# Patient Record
Sex: Female | Born: 1985 | Race: Black or African American | Hispanic: No | Marital: Single | Smoking: Never smoker
Health system: Southern US, Community
[De-identification: ages and names within clinical notes are randomized; demographics above are authoritative.]

## PROBLEM LIST (undated history)

## (undated) DIAGNOSIS — M25475 Effusion, left foot: Secondary | ICD-10-CM

## (undated) DIAGNOSIS — E559 Vitamin D deficiency, unspecified: Secondary | ICD-10-CM

## (undated) DIAGNOSIS — E669 Obesity, unspecified: Secondary | ICD-10-CM

## (undated) DIAGNOSIS — M25471 Effusion, right ankle: Secondary | ICD-10-CM

## (undated) DIAGNOSIS — R7303 Prediabetes: Secondary | ICD-10-CM

## (undated) DIAGNOSIS — I10 Essential (primary) hypertension: Secondary | ICD-10-CM

## (undated) DIAGNOSIS — M2550 Pain in unspecified joint: Secondary | ICD-10-CM

## (undated) DIAGNOSIS — M5430 Sciatica, unspecified side: Secondary | ICD-10-CM

## (undated) DIAGNOSIS — R5383 Other fatigue: Secondary | ICD-10-CM

## (undated) DIAGNOSIS — E739 Lactose intolerance, unspecified: Secondary | ICD-10-CM

## (undated) DIAGNOSIS — R0602 Shortness of breath: Secondary | ICD-10-CM

## (undated) DIAGNOSIS — R1310 Dysphagia, unspecified: Secondary | ICD-10-CM

## (undated) DIAGNOSIS — M549 Dorsalgia, unspecified: Secondary | ICD-10-CM

## (undated) DIAGNOSIS — F419 Anxiety disorder, unspecified: Secondary | ICD-10-CM

## (undated) DIAGNOSIS — E785 Hyperlipidemia, unspecified: Secondary | ICD-10-CM

## (undated) DIAGNOSIS — K219 Gastro-esophageal reflux disease without esophagitis: Secondary | ICD-10-CM

## (undated) DIAGNOSIS — M25474 Effusion, right foot: Secondary | ICD-10-CM

## (undated) DIAGNOSIS — R609 Edema, unspecified: Secondary | ICD-10-CM

## (undated) DIAGNOSIS — A64 Unspecified sexually transmitted disease: Secondary | ICD-10-CM

## (undated) DIAGNOSIS — F32A Depression, unspecified: Secondary | ICD-10-CM

## (undated) DIAGNOSIS — K0889 Other specified disorders of teeth and supporting structures: Secondary | ICD-10-CM

## (undated) DIAGNOSIS — R002 Palpitations: Secondary | ICD-10-CM

## (undated) HISTORY — DX: Palpitations: R00.2

## (undated) HISTORY — DX: Effusion, left foot: M25.471

## (undated) HISTORY — DX: Other specified disorders of teeth and supporting structures: K08.89

## (undated) HISTORY — DX: Unspecified sexually transmitted disease: A64

## (undated) HISTORY — DX: Depression, unspecified: F32.A

## (undated) HISTORY — DX: Essential (primary) hypertension: I10

## (undated) HISTORY — DX: Lactose intolerance, unspecified: E73.9

## (undated) HISTORY — PX: WISDOM TOOTH EXTRACTION: SHX21

## (undated) HISTORY — DX: Prediabetes: R73.03

## (undated) HISTORY — DX: Shortness of breath: R06.02

## (undated) HISTORY — DX: Edema, unspecified: R60.9

## (undated) HISTORY — DX: Effusion, right foot: M25.474

## (undated) HISTORY — DX: Vitamin D deficiency, unspecified: E55.9

## (undated) HISTORY — DX: Hyperlipidemia, unspecified: E78.5

## (undated) HISTORY — DX: Obesity, unspecified: E66.9

## (undated) HISTORY — PX: TONSILLECTOMY AND ADENOIDECTOMY: SUR1326

## (undated) HISTORY — DX: Dorsalgia, unspecified: M54.9

## (undated) HISTORY — DX: Other fatigue: R53.83

## (undated) HISTORY — DX: Sciatica, unspecified side: M54.30

## (undated) HISTORY — DX: Anxiety disorder, unspecified: F41.9

## (undated) HISTORY — DX: Gastro-esophageal reflux disease without esophagitis: K21.9

## (undated) HISTORY — DX: Effusion, left foot: M25.475

## (undated) HISTORY — DX: Pain in unspecified joint: M25.50

## (undated) HISTORY — DX: Dysphagia, unspecified: R13.10

---
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
## 2006-04-04 ENCOUNTER — Emergency Department (HOSPITAL_COMMUNITY): Admission: EM | Admit: 2006-04-04 | Discharge: 2006-04-04 | Payer: Self-pay | Admitting: *Deleted

## 2007-08-23 ENCOUNTER — Emergency Department (HOSPITAL_COMMUNITY): Admission: EM | Admit: 2007-08-23 | Discharge: 2007-08-23 | Payer: Self-pay | Admitting: Emergency Medicine

## 2009-07-13 ENCOUNTER — Emergency Department (HOSPITAL_COMMUNITY): Admission: EM | Admit: 2009-07-13 | Discharge: 2009-07-13 | Payer: Self-pay | Admitting: Emergency Medicine

## 2010-05-05 ENCOUNTER — Encounter: Payer: Self-pay | Admitting: Cardiovascular Disease

## 2010-05-05 ENCOUNTER — Emergency Department (HOSPITAL_COMMUNITY): Admission: EM | Admit: 2010-05-05 | Discharge: 2010-05-05 | Payer: Self-pay | Admitting: Emergency Medicine

## 2010-05-20 ENCOUNTER — Ambulatory Visit: Payer: Self-pay | Admitting: Family Medicine

## 2010-05-20 ENCOUNTER — Encounter: Payer: Self-pay | Admitting: Family Medicine

## 2010-05-20 DIAGNOSIS — M25559 Pain in unspecified hip: Secondary | ICD-10-CM | POA: Insufficient documentation

## 2010-05-20 DIAGNOSIS — R002 Palpitations: Secondary | ICD-10-CM | POA: Insufficient documentation

## 2010-05-24 ENCOUNTER — Ambulatory Visit: Payer: Self-pay | Admitting: Family Medicine

## 2010-05-26 ENCOUNTER — Telehealth: Payer: Self-pay | Admitting: Family Medicine

## 2010-05-31 LAB — CONVERTED CEMR LAB
Basophils Absolute: 0.1 10*3/uL (ref 0.0–0.1)
Free T4: 0.92 ng/dL (ref 0.60–1.60)
HCT: 34.4 % — ABNORMAL LOW (ref 36.0–46.0)
Hemoglobin: 11.5 g/dL — ABNORMAL LOW (ref 12.0–15.0)
Lymphs Abs: 2.9 10*3/uL (ref 0.7–4.0)
Monocytes Relative: 6.4 % (ref 3.0–12.0)
Neutro Abs: 2.5 10*3/uL (ref 1.4–7.7)
RDW: 16 % — ABNORMAL HIGH (ref 11.5–14.6)

## 2010-06-03 ENCOUNTER — Encounter: Admission: RE | Admit: 2010-06-03 | Discharge: 2010-06-03 | Payer: Self-pay | Admitting: Family Medicine

## 2010-06-23 ENCOUNTER — Ambulatory Visit: Payer: Self-pay | Admitting: Family Medicine

## 2010-06-28 ENCOUNTER — Encounter (INDEPENDENT_AMBULATORY_CARE_PROVIDER_SITE_OTHER): Payer: Self-pay | Admitting: *Deleted

## 2010-08-18 NOTE — Assessment & Plan Note (Signed)
Summary: new to estab/cbs   Vital Signs:  Patient profile:   25 year old female Height:      67.25 inches Weight:      294.4 pounds BMI:     45.93 Pulse rate:   60 / minute Pulse rhythm:   regular BP sitting:   122 / 74  (left arm) Cuff size:   large  Vitals Entered By: Almeta Monas CMA Duncan Dull) (May 20, 2010 10:07 AM) CC: New Est Care--Wants to discuss Thyroid and Left hip pain issues   History of Present Illness: Pt is here to establish.  She c/o L hip pain.  She went to ER on 05/05/10 and was dx with bursitis and given prednisone but it caused chest pain after 3rd dose so the pt stopped it and it took a day for cp to resolve.   Pt was in [facility] and injured L leg but was told she just had a contusion to L ankle.    Pt is also concerned because she was told she had thyroid problems but no blood work or US done.  Pt does c/o palpatations daily-- they last a few minutes.   Pt denies stress but is anxious a lot.    Preventive Screening-Counseling & Management  Alcohol-Tobacco     Smoking Status: never  Caffeine-Diet-Exercise     Does Patient Exercise: no      Drug Use:  no.    Current Medications (verified): 1)  Mobic 15 Mg Tabs (Meloxicam) .Marland Kitchen.. 1 By Mouth Once Daily As Needed  Allergies (verified): No Known Drug Allergies  Past History:  Family History: Last updated: 05/20/2010 Family History Diabetes 1st degree relative---Mother Family History Hypertension--Maternal Grandmother Family History of Stroke F 1st degree relative <60-- Paternal Grandmother  Social History: Last updated: 05/20/2010 Single Never Smoked Alcohol use-yes-- Social Drug use-no Regular exercise-no  Risk Factors: Exercise: no (05/20/2010)  Risk Factors: Smoking Status: never (05/20/2010)  Past Medical History: Unremarkable  Past Surgical History: Tonsillectomy--1994  Family History: Reviewed history and no changes required. Family History Diabetes 1st degree  relative---Mother Family History Hypertension--Maternal Grandmother Family History of Stroke F 1st degree relative <60-- Paternal Grandmother  Social History: Reviewed history and no changes required. Single Never Smoked Alcohol use-yes-- Social Drug use-no Regular exercise-no Smoking Status:  never Drug Use:  no Does Patient Exercise:  no  Review of Systems      See HPI  Physical Exam  General:  Well-developed,well-nourished,in no acute distress; alert,appropriate and cooperative throughout examinationoverweight-appearing.   Mouth:  Oral mucosa and oropharynx without lesions or exudates.  Teeth in good repair. Neck:  supple and full ROM.   + goiter Lungs:  Normal respiratory effort, chest expands symmetrically. Lungs are clear to auscultation, no crackles or wheezes. Heart:  normal rate and no murmur.   Abdomen:  Bowel sounds positive,abdomen soft and non-tender without masses, organomegaly or hernias noted. Skin:  Intact without suspicious lesions or rashes Psych:  Oriented X3 and normally interactive.     Impression & Recommendations:  Problem # 1:  GOITER (ICD-240.9) check labs  Orders: Radiology Referral (Radiology)  Problem # 2:  PALPITATIONS, RECURRENT (ICD-785.1)  check event monitor and labs  Orders: Cardiology Referral (Cardiology) EKG w/ Interpretation (93000)  Avoid caffeine, chocolate, and stimulants. Stress reduction as well as medication options discussed.   Problem # 3:  HIP PAIN, LEFT (ICD-719.45) prob bursitis Her updated medication list for this problem includes:    Mobic 15 Mg Tabs (  Meloxicam) .Marland Kitchen... 1 by mouth once daily as needed  Discussed use of medications, application of heat or cold, and exercises. --to ortho if no relief  Complete Medication List: 1)  Mobic 15 Mg Tabs (Meloxicam) .Marland Kitchen.. 1 by mouth once daily as needed  Patient Instructions: 1)  fasting labs  785.1  240.5   [facility], free T3,  free T4, cbcd 2)  We will call  you when your results come in  Prescriptions: MOBIC 15 MG TABS (MELOXICAM) 1 by mouth once daily as needed  #30 x 1   Entered and Authorized by:   Loreen Freud DO   Signed by:   Loreen Freud DO on 05/20/2010   Method used:   Electronically to        [facility] [address] (retail)       [id number]       [place], [place]  [address]       Ph: [phone]       Fax: [phone]   RxID:   [id number]    Orders Added: 1)  Radiology Referral [Radiology] 2)  Cardiology Referral [Cardiology] 3)  New Patient Level III [[address]] 4)  EKG w/ Interpretation [93000]

## 2010-08-18 NOTE — Progress Notes (Signed)
Summary: CARDIO REFERRAL  Phone Note Other Incoming   Summary of Call: IN REFERENCE TO REFERRAL FOR EVENT MONITOR.......Marland KitchenAFTER FAXING ALL NOTES TO [facility] ON 05-20-2010, I JUST REC'D CALL FROM [facility], THIS EQUIPMENT/SERVICE IS NOT COVERED UNDER PATIENT'S PLAN, WAS ALSO REVIEWED BY MEDICAL DIRECTOR DR. THOMAS GAY WHO HAS DENIED.   Initial call taken by: Magdalen Spatz [facility]  May 26, 2010 4:52 PM  Follow-up for Phone Call        refer to cardio  Follow-up by: Loreen Freud DO,  May 26, 2010 5:00 PM  Additional Follow-up for Phone Call Additional follow up Details #1::        PATIENT APPT IS 06-01-2010 @ 8:15AM W/DR Eden Emms [facility], I WILL INFORM PT. Magdalen Spatz [facility]  May 27, 2010 4:31 PM

## 2010-08-18 NOTE — Letter (Signed)
Summary: Appointment - Missed  [facility], Main Office  [address]   [place], [place] [address]   Phone: [phone]  Fax: [phone]         June 28, 2010 MRN: [id number]      [facility] [address] [place], [place]  [address]    Dear Ms. Wion,  Our records indicate you missed your appointment on June 16, 2010 with Dr. Eden Emms.  It is very important that we reach you to reschedule this appointment. We look forward to participating in your health care needs. Please contact us at the number listed above at your earliest convenience to reschedule this appointment.     Sincerely,  Roe Coombs  [employer] Scheduling Team

## 2010-08-18 NOTE — Letter (Signed)
Summary: WL: Physician Documentation Sheet  WL: Physician Documentation Sheet   Imported By: Earl Many 05/31/2010 16:03:31  _____________________________________________________________________  External Attachment:    Type:   Image     Comment:   External Document

## 2010-08-18 NOTE — Assessment & Plan Note (Signed)
Summary: Review [facility]//KP   Vital Signs:  Patient profile:   25 year old female Weight:      294 pounds BMI:     45.87 Pulse rate:   60 / minute Pulse rhythm:   regular BP sitting:   116 / 72  (left arm) Cuff size:   regular  Vitals Entered By: Almeta Monas CMA Duncan Dull) (June 23, 2010 1:08 PM) CC: Review [facility]   History of Present Illness: Pt is here to review [facility].   No complaints.   Current Medications (verified): 1)  None  Allergies (verified): No Known Drug Allergies  Past History:  Past Medical History: Last updated: 05/31/2010 PALPITATIONS, RECURRENT  HIP PAIN, LEFT  GOITER FAMILY HISTORY DIABETES 1ST DEGREE RELATIVE  Past Surgical History: Last updated: 05/20/2010 Tonsillectomy--1994  Family History: Last updated: 05/20/2010 Family History Diabetes 1st degree relative---Mother Family History Hypertension--Maternal Grandmother Family History of Stroke F 1st degree relative <60-- Paternal Grandmother  Social History: Last updated: 05/20/2010 Single Never Smoked Alcohol use-yes-- Social Drug use-no Regular exercise-no  Risk Factors: Exercise: no (05/20/2010)  Risk Factors: Smoking Status: never (05/20/2010)  Family History: Reviewed history from 05/20/2010 and no changes required. Family History Diabetes 1st degree relative---Mother Family History Hypertension--Maternal Grandmother Family History of Stroke F 1st degree relative <60-- Paternal Grandmother  Social History: Reviewed history from 05/20/2010 and no changes required. Single Never Smoked Alcohol use-yes-- Social Drug use-no Regular exercise-no  Review of Systems      See HPI  Physical Exam  General:  Well-developed,well-nourished,in no acute distress; alert,appropriate and cooperative throughout examination Psych:  Cognition and judgment appear intact. Alert and cooperative with normal attention span and concentration. No apparent  delusions, illusions, hallucinations   Impression & Recommendations:  Problem # 1:  FAMILY HISTORY DIABETES 1ST DEGREE RELATIVE (ICD-V18.0)  Problem # 2:  MORBID OBESITY (ICD-278.01) diet and exercise d/w patient Ht: 67.25 (05/20/2010)   Wt: 294 (06/23/2010)   BMI: 45.87 (06/23/2010) we will recheck labs in 3-6 months  Patient Instructions: 1)  Please schedule a follow-up appointment in 3 months ---- [facility]  V18.0     Orders Added: 1)  Est. Patient Level III [[address]]

## 2010-09-16 ENCOUNTER — Other Ambulatory Visit: Payer: Self-pay

## 2010-10-17 LAB — POCT I-STAT, CHEM 8
HCT: 41 % (ref 36.0–46.0)
Hemoglobin: 13.9 g/dL (ref 12.0–15.0)
Potassium: 4.6 mEq/L (ref 3.5–5.1)
Sodium: 136 mEq/L (ref 135–145)
TCO2: 20 mmol/L (ref 0–100)

## 2010-10-17 LAB — WET PREP, GENITAL
Clue Cells Wet Prep HPF POC: NONE SEEN
Trich, Wet Prep: NONE SEEN
Yeast Wet Prep HPF POC: NONE SEEN

## 2010-10-17 LAB — DIFFERENTIAL
Basophils Absolute: 0 10*3/uL (ref 0.0–0.1)
Basophils Relative: 0 % (ref 0–1)
Eosinophils Absolute: 0 10*3/uL (ref 0.0–0.7)
Eosinophils Relative: 0 % (ref 0–5)
Lymphocytes Relative: 26 % (ref 12–46)

## 2010-10-17 LAB — URINALYSIS, ROUTINE W REFLEX MICROSCOPIC
Ketones, ur: 15 mg/dL — AB
Leukocytes, UA: NEGATIVE
Nitrite: NEGATIVE
Protein, ur: NEGATIVE mg/dL
Urobilinogen, UA: 0.2 mg/dL (ref 0.0–1.0)

## 2010-10-17 LAB — GLUCOSE, CAPILLARY

## 2010-10-17 LAB — CBC
HCT: 38.7 % (ref 36.0–46.0)
MCHC: 32.8 g/dL (ref 30.0–36.0)
MCV: 75.1 fL — ABNORMAL LOW (ref 78.0–100.0)
Platelets: 206 10*3/uL (ref 150–400)
RDW: 16 % — ABNORMAL HIGH (ref 11.5–15.5)

## 2011-04-04 ENCOUNTER — Emergency Department (HOSPITAL_COMMUNITY)
Admission: EM | Admit: 2011-04-04 | Discharge: 2011-04-05 | Disposition: A | Discharge status: Home / Self Care | Payer: Worker's Compensation | Attending: Emergency Medicine | Admitting: Emergency Medicine

## 2011-04-04 DIAGNOSIS — M25569 Pain in unspecified knee: Secondary | ICD-10-CM | POA: Insufficient documentation

## 2011-04-04 DIAGNOSIS — Y92009 Unspecified place in unspecified non-institutional (private) residence as the place of occurrence of the external cause: Secondary | ICD-10-CM | POA: Insufficient documentation

## 2011-04-04 DIAGNOSIS — W11XXXA Fall on and from ladder, initial encounter: Secondary | ICD-10-CM | POA: Insufficient documentation

## 2011-04-05 ENCOUNTER — Other Ambulatory Visit: Payer: Self-pay | Admitting: Occupational Medicine

## 2011-04-05 ENCOUNTER — Ambulatory Visit: Payer: Worker's Compensation

## 2011-04-05 DIAGNOSIS — R52 Pain, unspecified: Secondary | ICD-10-CM

## 2011-04-07 LAB — INFLUENZA A AND B ANTIGEN (CONVERTED LAB): Influenza B Ag: NEGATIVE

## 2011-07-24 ENCOUNTER — Encounter: Payer: Self-pay | Admitting: Family Medicine

## 2011-07-24 ENCOUNTER — Ambulatory Visit (INDEPENDENT_AMBULATORY_CARE_PROVIDER_SITE_OTHER): Payer: 59 | Admitting: Family Medicine

## 2011-07-24 DIAGNOSIS — R635 Abnormal weight gain: Secondary | ICD-10-CM

## 2011-07-24 DIAGNOSIS — R739 Hyperglycemia, unspecified: Secondary | ICD-10-CM

## 2011-07-24 DIAGNOSIS — R7309 Other abnormal glucose: Secondary | ICD-10-CM

## 2011-07-24 DIAGNOSIS — K5900 Constipation, unspecified: Secondary | ICD-10-CM

## 2011-07-24 DIAGNOSIS — R21 Rash and other nonspecific skin eruption: Secondary | ICD-10-CM

## 2011-07-24 MED ORDER — MOMETASONE FUROATE 0.1 % EX CREA: TOPICAL_CREAM | Freq: Every day | CUTANEOUS | Status: DC

## 2011-07-24 NOTE — Patient Instructions (Signed)

## 2011-07-24 NOTE — Progress Notes (Signed)
  Subjective:     Lindsey Figueroa is a 26 y.o. female who presents for evaluation of constipation. Onset was several weeks ago. Patient has been having 2 stools a day but states they are hard.  .. Co-Morbid conditions:none. Symptoms have been occurring for several weeks.  . Current Health Habits: Eating fiber? no, Exercise? no, Adequate hydration? no. Current over the counter/prescription laxative: mag citrate which has been not very effective.   Review of Systems Review of Systems  Constitutional: Negative for activity change, appetite change and fatigue.  HENT: Negative for hearing loss, congestion, tinnitus and ear discharge.  dentist q37m Eyes: Negative for visual disturbance (see optho q1y -- vision corrected to 20/20 with glasses).  Respiratory: Negative for cough, chest tightness and shortness of breath.   Cardiovascular: Negative for chest pain, palpitations and leg swelling.  Gastrointestinal: Negative for abdominal pain, diarrhea, constipation and abdominal distention.  Genitourinary: Negative for urgency, frequency, decreased urine volume and difficulty urinating.  Musculoskeletal: Negative for back pain, arthralgias and gait problem.  Skin: Negative for color change, pallor    __+++  rash Neurological: Negative for dizziness, light-headedness, numbness and headaches.  Hematological: Negative for adenopathy. Does not bruise/bleed easily.  Psychiatric/Behavioral: Negative for suicidal ideas, confusion, sleep disturbance, self-injury, dysphoric mood, decreased concentration and agitation.      Objective:    Gen---AAOx3  NAD Cor--  S1S2 Lungs-- CTAB/L abd --soft NT Skin-- + linear rash  L side chest,  Papular--- not vesicular        A/P---  1.  Constipation---  Increase fiber,  Increase water intake and exercise--- explained to pt that she really does not have constipation               2.   Rash--- ? Etiology-- elocon             3 obesity--- rec  Weight watchers and  exercise                                             Assessment:      Plan:

## 2011-07-25 LAB — BASIC METABOLIC PANEL
GFR: 128.34 mL/min (ref >60.00)
Glucose, Bld: 97 mg/dL (ref 70–99)
Potassium: 4.9 mEq/L (ref 3.5–5.1)
Sodium: 137 mEq/L (ref 135–145)

## 2011-07-25 LAB — TSH: TSH: 1.69 u[IU]/mL (ref 0.35–5.50)

## 2011-07-26 ENCOUNTER — Other Ambulatory Visit: Payer: Self-pay

## 2011-07-26 DIAGNOSIS — R21 Rash and other nonspecific skin eruption: Secondary | ICD-10-CM

## 2011-07-26 NOTE — Telephone Encounter (Signed)
msg left to call the office     KP 

## 2011-07-26 NOTE — Telephone Encounter (Signed)
Message copied by Arnette Norris on Wed Jul 26, 2011  5:14 PM ------      Message from: Lelon Perla      Created: Mon Jul 24, 2011  9:39 PM       I forgot to ask for pharmacy before they left .  I wanted to send elocon cream to pharmacy for her===  sorry

## 2011-07-28 MED ORDER — MOMETASONE FUROATE 0.1 % EX CREA: TOPICAL_CREAM | Freq: Every day | CUTANEOUS | Status: AC

## 2011-07-28 NOTE — Telephone Encounter (Signed)
[facility] per patient---- Rx faxed    KP

## 2011-07-28 NOTE — Telephone Encounter (Signed)
Patient returned phone call. Best# [id number]

## 2011-08-01 ENCOUNTER — Other Ambulatory Visit (HOSPITAL_COMMUNITY)
Admission: RE | Admit: 2011-08-01 | Discharge: 2011-08-01 | Disposition: A | Discharge status: Home / Self Care | Payer: 59 | Source: Ambulatory Visit | Attending: Obstetrics and Gynecology | Admitting: Obstetrics and Gynecology

## 2011-08-01 ENCOUNTER — Other Ambulatory Visit: Payer: Self-pay | Admitting: Obstetrics and Gynecology

## 2011-08-01 DIAGNOSIS — Z113 Encounter for screening for infections with a predominantly sexual mode of transmission: Secondary | ICD-10-CM | POA: Insufficient documentation

## 2011-08-01 DIAGNOSIS — Z124 Encounter for screening for malignant neoplasm of cervix: Secondary | ICD-10-CM | POA: Insufficient documentation

## 2012-04-03 ENCOUNTER — Ambulatory Visit (INDEPENDENT_AMBULATORY_CARE_PROVIDER_SITE_OTHER): Payer: BC Managed Care – PPO | Admitting: Family Medicine

## 2012-04-03 ENCOUNTER — Encounter: Payer: Self-pay | Admitting: Family Medicine

## 2012-04-03 VITALS — BP 118/70 | HR 79 | Temp 98.3°F | Wt 319.0 lb

## 2012-04-03 DIAGNOSIS — Z309 Encounter for contraceptive management, unspecified: Secondary | ICD-10-CM | POA: Insufficient documentation

## 2012-04-03 DIAGNOSIS — Z7251 High risk heterosexual behavior: Secondary | ICD-10-CM

## 2012-04-03 LAB — HCG, QUANTITATIVE, PREGNANCY: hCG, Beta Chain, Quant, S: 0.18 m[IU]/mL

## 2012-04-03 MED ORDER — DESOGESTREL-ETHINYL ESTRADIOL 0.15-0.02/0.01 MG (21/5) PO TABS: 1.0000 | ORAL_TABLET | Freq: Every day | ORAL | Status: DC

## 2012-04-03 NOTE — Assessment & Plan Note (Signed)
mirette---d/w pt how to take Check preg test

## 2012-04-03 NOTE — Patient Instructions (Signed)
Contraception Choices Contraception (birth control) is the use of any methods or devices to prevent pregnancy. Below are some methods to help avoid pregnancy. HORMONAL METHODS   Contraceptive implant. This is a thin, plastic tube containing progesterone hormone. It does not contain estrogen hormone. Your caregiver inserts the tube in the inner part of the upper arm. The tube can remain in place for up to 3 years. After 3 years, the implant must be removed. The implant prevents the ovaries from releasing an egg (ovulation), thickens the cervical mucus which prevents sperm from entering the uterus, and thins the lining of the inside of the uterus.   Progesterone-only injections. These injections are given every 3 months by your caregiver to prevent pregnancy. This synthetic progesterone hormone stops the ovaries from releasing eggs. It also thickens cervical mucus and changes the uterine lining. This makes it harder for sperm to survive in the uterus.   Birth control pills. These pills contain estrogen and progesterone hormone. They work by stopping the egg from forming in the ovary (ovulation). Birth control pills are prescribed by a caregiver.Birth control pills can also be used to treat heavy periods.   Minipill. This type of birth control pill contains only the progesterone hormone. They are taken every day of each month and must be prescribed by your caregiver.   Birth control patch. The patch contains hormones similar to those in birth control pills. It must be changed once a week and is prescribed by a caregiver.   Vaginal ring. The ring contains hormones similar to those in birth control pills. It is left in the vagina for 3 weeks, removed for 1 week, and then a new one is put back in place. The patient must be comfortable inserting and removing the ring from the vagina.A caregiver's prescription is necessary.   Emergency contraception. Emergency contraceptives prevent pregnancy after  unprotected sexual intercourse. This pill can be taken right after sex or up to 5 days after unprotected sex. It is most effective the sooner you take the pills after having sexual intercourse. Emergency contraceptive pills are available without a prescription. Check with your pharmacist. Do not use emergency contraception as your only form of birth control.  BARRIER METHODS   Female condom. This is a thin sheath (latex or rubber) that is worn over the penis during sexual intercourse. It can be used with spermicide to increase effectiveness.   Female condom. This is a soft, loose-fitting sheath that is put into the vagina before sexual intercourse.   Diaphragm. This is a soft, latex, dome-shaped barrier that must be fitted by a caregiver. It is inserted into the vagina, along with a spermicidal jelly. It is inserted before intercourse. The diaphragm should be left in the vagina for 6 to 8 hours after intercourse.   Cervical cap. This is a round, soft, latex or plastic cup that fits over the cervix and must be fitted by a caregiver. The cap can be left in place for up to 48 hours after intercourse.   Sponge. This is a soft, circular piece of polyurethane foam. The sponge has spermicide in it. It is inserted into the vagina after wetting it and before sexual intercourse.   Spermicides. These are chemicals that kill or block sperm from entering the cervix and uterus. They come in the form of creams, jellies, suppositories, foam, or tablets. They do not require a prescription. They are inserted into the vagina with an applicator before having sexual intercourse. The process must be   repeated every time you have sexual intercourse.  INTRAUTERINE CONTRACEPTION  Intrauterine device (IUD). This is a T-shaped device that is put in a woman's uterus during a menstrual period to prevent pregnancy. There are 2 types:   Copper IUD. This type of IUD is wrapped in copper wire and is placed inside the uterus. Copper  makes the uterus and fallopian tubes produce a fluid that kills sperm. It can stay in place for 10 years.   Hormone IUD. This type of IUD contains the hormone progestin (synthetic progesterone). The hormone thickens the cervical mucus and prevents sperm from entering the uterus, and it also thins the uterine lining to prevent implantation of a fertilized egg. The hormone can weaken or kill the sperm that get into the uterus. It can stay in place for 5 years.  PERMANENT METHODS OF CONTRACEPTION  Female tubal ligation. This is when the woman's fallopian tubes are surgically sealed, tied, or blocked to prevent the egg from traveling to the uterus.   Female sterilization. This is when the female has the tubes that carry sperm tied off (vasectomy).This blocks sperm from entering the vagina during sexual intercourse. After the procedure, the man can still ejaculate fluid (semen).  NATURAL PLANNING METHODS  Natural family planning. This is not having sexual intercourse or using a barrier method (condom, diaphragm, cervical cap) on days the woman could become pregnant.   Calendar method. This is keeping track of the length of each menstrual cycle and identifying when you are fertile.   Ovulation method. This is avoiding sexual intercourse during ovulation.   Symptothermal method. This is avoiding sexual intercourse during ovulation, using a thermometer and ovulation symptoms.   Post-ovulation method. This is timing sexual intercourse after you have ovulated.  Regardless of which type or method of contraception you choose, it is important that you use condoms to protect against the transmission of sexually transmitted diseases (STDs). Talk with your caregiver about which form of contraception is most appropriate for you. Document Released: 07/03/2005 Document Revised: 06/22/2011 Document Reviewed: 11/09/2010 Exit[employer] Patient Information 2012 [employer]. 

## 2012-04-03 NOTE — Progress Notes (Signed)
  Subjective:    Patient ID: Lindsey Figueroa, female    DOB: 1985-08-01, 26 y.o.   MRN: [id number]  HPI Pt here to discuss bcp.  She is interested in bcp.  She had unprotected sex last week and wants to be checked for pregnancy even though she has her period right now..     Review of Systems    as above Objective:   Physical Exam  Constitutional: She is oriented to person, place, and time. She appears well-developed and well-nourished.  Neurological: She is alert and oriented to person, place, and time.  Psychiatric: She has a normal mood and affect. Her behavior is normal. Judgment and thought content normal.          Assessment & Plan:

## 2012-04-16 ENCOUNTER — Telehealth: Payer: Self-pay | Admitting: Family Medicine

## 2012-04-16 MED ORDER — FLUCONAZOLE 150 MG PO TABS: 150.0000 mg | ORAL_TABLET | Freq: Once | ORAL | Status: DC

## 2012-04-16 NOTE — Telephone Encounter (Signed)
Please advise      KP 

## 2012-04-16 NOTE — Telephone Encounter (Signed)
Caller: Linlee/Patient; Patient Name: Lindsey Figueroa; PCP: Lelon Perla.; [facility] Callback Phone Number: [phone]; Reason for call: Believes she has a yeast infection.  LMP 04/09/12; Patient is having severe vaginal itching to the point of making her bleed. Triaged using Vaginal Discharge or Irritation with a disposition to see provider within 24 hours due to first time occurrence of foul- smelling or unusual vaginal discharge.   OFFICE:  PLEASE FOLLOW UP WITH PATIENT.  DR. Laury Axon AND DR. PAZ ARE FULL. TRIAGED SEE IN 24 BUT IS VERY UNCOMFORTABLE DUE TO THE ITCHING.  THANKS.

## 2012-04-16 NOTE — Telephone Encounter (Signed)
Rx sent--- msg left to cal the office     KP

## 2012-04-16 NOTE — Telephone Encounter (Signed)
Diflucan 150 mg  #1  Po qd x1------ ov for tomorrow or Thursday ---she can cancel if it resolves

## 2012-04-17 NOTE — Telephone Encounter (Signed)
[facility] Fax: [phone] From: [facility] Date/ Time: 04/16/2012 5:12 PM Taken By: Jethro BolusVioleta Figueroa Facility: home Patient: Lindsey Figueroa, Lindsey Figueroa DOB: September 25, 1985 Phone: [phone] Reason for Call: Patient returning call to [facility] regarding scheduling an appointment. Please call at [phone] Regarding Appointment: Appt Date: Appt Time: Unknown Provider: Reason: Details: Outcome:

## 2012-04-17 NOTE — Telephone Encounter (Signed)
Detailed msg left advising the patient that the RX was filled and follow up as needed.      KP

## 2012-05-20 ENCOUNTER — Encounter: Payer: BC Managed Care – PPO | Admitting: Family Medicine

## 2012-05-20 ENCOUNTER — Ambulatory Visit (INDEPENDENT_AMBULATORY_CARE_PROVIDER_SITE_OTHER): Payer: BC Managed Care – PPO | Admitting: Family Medicine

## 2012-05-20 ENCOUNTER — Encounter: Payer: Self-pay | Admitting: Family Medicine

## 2012-05-20 ENCOUNTER — Telehealth: Payer: Self-pay | Admitting: Family Medicine

## 2012-05-20 VITALS — BP 112/80 | HR 76 | Temp 98.1°F | Resp 16 | Wt 317.5 lb

## 2012-05-20 DIAGNOSIS — N898 Other specified noninflammatory disorders of vagina: Secondary | ICD-10-CM

## 2012-05-20 DIAGNOSIS — L293 Anogenital pruritus, unspecified: Secondary | ICD-10-CM

## 2012-05-20 DIAGNOSIS — R3989 Other symptoms and signs involving the genitourinary system: Secondary | ICD-10-CM

## 2012-05-20 DIAGNOSIS — R399 Unspecified symptoms and signs involving the genitourinary system: Secondary | ICD-10-CM | POA: Insufficient documentation

## 2012-05-20 LAB — POCT URINALYSIS DIPSTICK
Glucose, UA: NEGATIVE
Ketones, UA: NEGATIVE
Nitrite, UA: NEGATIVE
Nitrite, UA: NEGATIVE
Spec Grav, UA: 1.02
Urobilinogen, UA: 0.2
Urobilinogen, UA: 0.2
pH, UA: 6
pH, UA: 6

## 2012-05-20 MED ORDER — CEPHALEXIN 500 MG PO CAPS: 500.0000 mg | ORAL_CAPSULE | Freq: Two times a day (BID) | ORAL | Status: AC

## 2012-05-20 NOTE — Progress Notes (Signed)
  Subjective:    Patient ID: Lindsey Figueroa, female    DOB: Jan 29, 1986, 26 y.o.   MRN: [id number]  HPI Vaginal itching- sxs started over 1 month ago.  Was given Diflucan on 10/1.  sxs initially improved but returned.  Now burning, itching, 'very comfortable'.  + vaginal discharge.  + burning w/ urination.  Increased frequency.  Denies incomplete emptying or hesitancy.  No blood in urine.  No fevers, no back pain.   Review of Systems For ROS see HPI     Objective:   Physical Exam  Vitals reviewed. Constitutional: She appears well-developed and well-nourished. No distress.       obese  Abdominal: Soft. She exhibits no distension. There is no tenderness (no suprapubic or CVA tenderness).  Genitourinary: No vaginal discharge (scant foul smelling d/c) found.          Assessment & Plan:

## 2012-05-20 NOTE — Telephone Encounter (Signed)
Patient calling, she took the Diflucan 150mg  po several weeks ago and the sx improved slightly but are still present.  No fever.  No additional vaginal discharge. Has burning on urination.  Triaged per Vaginal Disharge, needs to be seen in 24 hours.  Scheduled for a 30 minute visit this afternoon with Dr. Beverely Low at 1345.

## 2012-05-20 NOTE — Patient Instructions (Addendum)
Start the Keflex for a UTI- twice daily w/ food Drink plenty of fluids We'll determine the cause of your vaginal discharge and call in the appropriate medicine Call with any questions or concerns Hang in there!!

## 2012-05-20 NOTE — Assessment & Plan Note (Signed)
New to provider.  No improvement w/ diflucan.  Pt w/ odor on exam, suspect BV.  Check wet prep- start meds prn.  Reviewed supportive care and red flags that should prompt return.  Pt expressed understanding and is in agreement w/ plan.

## 2012-05-20 NOTE — Assessment & Plan Note (Signed)
New.  Pt's UA consistent w/ infxn.  Start abx.  Await urine cx.  Reviewed supportive care and red flags that should prompt return.  Pt expressed understanding and is in agreement w/ plan.  

## 2012-05-20 NOTE — Progress Notes (Signed)
This encounter was created in error - please disregard.

## 2012-05-20 NOTE — Telephone Encounter (Signed)
Patient has a pending apt today-- No further action needed will forward to MD as an FYI.     KP

## 2012-05-21 MED ORDER — METRONIDAZOLE 500 MG PO TABS: 500.0000 mg | ORAL_TABLET | Freq: Two times a day (BID) | ORAL | Status: DC

## 2012-05-21 NOTE — Addendum Note (Signed)
Addended by: Jackson Latino on: 05/21/2012 09:52 AM   Modules accepted: Orders

## 2012-06-14 ENCOUNTER — Other Ambulatory Visit: Payer: Self-pay | Admitting: Family Medicine

## 2012-06-14 MED ORDER — METRONIDAZOLE 500 MG PO TABS: 500.0000 mg | ORAL_TABLET | Freq: Two times a day (BID) | ORAL | Status: AC

## 2012-06-14 NOTE — Telephone Encounter (Signed)
refill Metronidazol 500mg  tab acta #14 last fill 11.5.13, Take one tablet by mouth twice daily last ov 11.4.13 acute

## 2012-06-14 NOTE — Telephone Encounter (Signed)
Please advise      KP 

## 2012-06-14 NOTE — Telephone Encounter (Signed)
Ok to refill x1  ---will need ov if it does not resolve

## 2012-06-14 NOTE — Telephone Encounter (Signed)
Patient aware and voiced understanding. Rx sent      KP 

## 2013-02-02 ENCOUNTER — Encounter (HOSPITAL_COMMUNITY): Payer: Self-pay | Admitting: Nurse Practitioner

## 2013-02-02 ENCOUNTER — Emergency Department (HOSPITAL_COMMUNITY)
Admission: EM | Admit: 2013-02-02 | Discharge: 2013-02-02 | Disposition: A | Discharge status: Home / Self Care | Payer: Self-pay | Attending: Emergency Medicine | Admitting: Emergency Medicine

## 2013-02-02 DIAGNOSIS — Z3202 Encounter for pregnancy test, result negative: Secondary | ICD-10-CM | POA: Insufficient documentation

## 2013-02-02 DIAGNOSIS — M549 Dorsalgia, unspecified: Secondary | ICD-10-CM

## 2013-02-02 DIAGNOSIS — M545 Low back pain, unspecified: Secondary | ICD-10-CM | POA: Insufficient documentation

## 2013-02-02 DIAGNOSIS — R52 Pain, unspecified: Secondary | ICD-10-CM | POA: Insufficient documentation

## 2013-02-02 LAB — URINALYSIS, ROUTINE W REFLEX MICROSCOPIC
Glucose, UA: NEGATIVE mg/dL
Leukocytes, UA: NEGATIVE
Protein, ur: NEGATIVE mg/dL
Specific Gravity, Urine: 1.016 (ref 1.005–1.030)
pH: 6 (ref 5.0–8.0)

## 2013-02-02 MED ORDER — HYDROCODONE-ACETAMINOPHEN 5-325 MG PO TABS: 1.0000 | ORAL_TABLET | Freq: Four times a day (QID) | ORAL | Status: DC | PRN

## 2013-02-02 MED ORDER — OXYCODONE-ACETAMINOPHEN 5-325 MG PO TABS
1.0000 | ORAL_TABLET | Freq: Once | ORAL | Status: AC
  Administered 2013-02-02: 1 via ORAL
  Filled 2013-02-02: qty 1

## 2013-02-02 MED ORDER — MELOXICAM 15 MG PO TABS: 15.0000 mg | ORAL_TABLET | Freq: Every day | ORAL | Status: DC

## 2013-02-02 MED ORDER — ONDANSETRON 4 MG PO TBDP
4.0000 mg | ORAL_TABLET | Freq: Once | ORAL | Status: AC
  Administered 2013-02-02: 4 mg via ORAL
  Filled 2013-02-02: qty 1

## 2013-02-02 NOTE — ED Notes (Signed)
Pt with lower back pain, "feels like a muscle spasm" since yesterday am. Pain relieved by walking. Pt reports she was bending and lifting objects before the pain started. Took a muscle relaxer with no relief

## 2013-02-02 NOTE — ED Notes (Signed)
Signature pad not working. 

## 2013-02-02 NOTE — ED Provider Notes (Signed)
History    CSN: [id number] Arrival date & time 02/02/13  1216  First MD Initiated Contact with Patient 02/02/13 1224     Chief Complaint  Patient presents with  . Back Pain   (Consider location/radiation/quality/duration/timing/severity/associated sxs/prior Treatment) HPI Comments:  Lindsey Figueroa is a 27 y.o. female who complains of low back pain for 2 day(s), positional with bending or lifting, without radiation down the legs. Precipitating factors: none recalled by the patient. Prior history of back problems: no prior back problems. There is no numbness in the legs. She c/o L lumbar pain. Worse with twisting and bending.  No radiation. No urinary sxs or vaginal sxs.      Patient is a 27 y.o. female presenting with back pain. The history is provided by the patient. No language interpreter was used.  Back Pain Location:  Lumbar spine Quality:  Aching Radiates to:  Does not radiate Pain severity:  Moderate Pain is:  Same all the time Onset quality:  Gradual Duration:  2 days Timing:  Constant Chronicity:  New Context: not emotional stress, not falling, not lifting heavy objects, not MCA, not MVA, not pedestrian accident, not physical stress, not recent illness and not twisting   Relieved by:  Nothing Worsened by:  Ambulation, bending and coughing Associated symptoms: no abdominal pain, no abdominal swelling, no bladder incontinence, no bowel incontinence, no chest pain, no dysuria, no fever, no headaches, no leg pain, no numbness, no paresthesias, no pelvic pain, no perianal numbness, no tingling, no weakness and no weight loss    History reviewed. No pertinent past medical history. History reviewed. No pertinent past surgical history. History reviewed. No pertinent family history. History  Substance Use Topics  . Smoking status: Never Smoker   . Smokeless tobacco: Never Used  . Alcohol Use: No   OB History   Grav Para Term Preterm Abortions TAB SAB Ect Mult Living               Review of Systems  Constitutional: Negative for fever and weight loss.  Cardiovascular: Negative for chest pain.  Gastrointestinal: Negative for abdominal pain and bowel incontinence.  Genitourinary: Negative for bladder incontinence, dysuria and pelvic pain.  Musculoskeletal: Positive for back pain.  Neurological: Negative for tingling, weakness, numbness, headaches and paresthesias.    Allergies  Review of patient's allergies indicates no known allergies.  Home Medications   Current Outpatient Rx  Name  Route  Sig  Dispense  Refill  . desogestrel-ethinyl estradiol (KARIVA,AZURETTE,MIRCETTE) 0.15-0.02/0.01 MG (21/5) tablet   Oral   Take 1 tablet by mouth daily.   1 Package   11   . fluconazole (DIFLUCAN) 150 MG tablet   Oral   Take 1 tablet (150 mg total) by mouth once.   1 tablet   0    BP 95/76  Pulse 69  Temp(Src) 98.1 F (36.7 C) (Oral)  Resp 20  SpO2 99% Physical Exam  Constitutional: She is oriented to person, place, and time. She appears well-developed and well-nourished. No distress.  HENT:  Head: Normocephalic and atraumatic.  Eyes: Conjunctivae are normal. No scleral icterus.  Neck: Normal range of motion.  Cardiovascular: Normal rate, regular rhythm and normal heart sounds.  Exam reveals no gallop and no friction rub.   No murmur heard. Pulmonary/Chest: Effort normal and breath sounds normal. No respiratory distress.  Abdominal: Soft. Bowel sounds are normal. She exhibits no distension and no mass. There is no tenderness. There is no guarding.  Musculoskeletal:  Back, ROM limited due to pain. TTP L lumbar paraspinals. No cva tenderness  Neurological: She is alert and oriented to person, place, and time. She has normal reflexes. She displays normal reflexes.  Strength 5/5  Skin: Skin is warm and dry. She is not diaphoretic.    ED Course  Procedures (including critical care time) Labs Reviewed  URINALYSIS, ROUTINE W REFLEX MICROSCOPIC -  Abnormal; Notable for the following:    APPearance CLOUDY (*)    Hgb urine dipstick SMALL (*)    All other components within normal limits  URINE MICROSCOPIC-ADD ON - Abnormal; Notable for the following:    Squamous Epithelial / LPF FEW (*)    All other components within normal limits  POCT PREGNANCY, URINE   No results found. 1. Back pain, acute     MDM  3:18 PM Filed Vitals:   02/02/13 1226 02/02/13 1237  BP: 133/70 95/76  Pulse: 70 69  Temp: 98.1 F (36.7 C)   TempSrc: Oral Oral  Resp: 20   SpO2: 100% 99%    Patient with negative urine. Patient states that her down after admin of pecocet. Patient with back pain.  No neurological deficits and normal neuro exam.  Patient can walk but states is painful.  No loss of bowel or bladder control.  No concern for cauda equina.  No fever, night sweats, weight loss, h/o cancer, IVDU.  RICE protocol and pain medicine indicated and discussed with patient.     Arthor Captain, PA-C 02/02/13 1519

## 2013-02-03 NOTE — ED Provider Notes (Signed)
Medical screening examination/treatment/procedure(s) were performed by non-physician practitioner and as supervising physician I was immediately available for consultation/collaboration.    Sheridan Hew D Enis Riecke, MD 02/03/13 0947 

## 2013-02-25 ENCOUNTER — Encounter: Payer: BC Managed Care – PPO | Admitting: Family Medicine

## 2013-03-10 ENCOUNTER — Encounter: Payer: Self-pay | Admitting: Lab

## 2013-03-11 ENCOUNTER — Encounter: Payer: BC Managed Care – PPO | Admitting: Family Medicine

## 2013-05-22 ENCOUNTER — Other Ambulatory Visit: Payer: Self-pay

## 2013-09-18 ENCOUNTER — Telehealth: Payer: Self-pay

## 2013-09-18 NOTE — Telephone Encounter (Signed)
Left message for call back Non-identifiable   Pap- 08/04/11-negative; scheduled to receive Pap with upcoming appt on 3/9 Flu- Td-

## 2013-09-22 ENCOUNTER — Other Ambulatory Visit (HOSPITAL_COMMUNITY)
Admission: RE | Admit: 2013-09-22 | Discharge: 2013-09-22 | Disposition: A | Discharge status: Home / Self Care | Payer: BC Managed Care – PPO | Source: Ambulatory Visit | Attending: Family Medicine | Admitting: Family Medicine

## 2013-09-22 ENCOUNTER — Encounter: Payer: Self-pay | Admitting: Family Medicine

## 2013-09-22 ENCOUNTER — Ambulatory Visit (INDEPENDENT_AMBULATORY_CARE_PROVIDER_SITE_OTHER): Payer: BC Managed Care – PPO | Admitting: Family Medicine

## 2013-09-22 VITALS — BP 118/72 | HR 85 | Temp 98.3°F | Ht 66.5 in | Wt 329.0 lb

## 2013-09-22 DIAGNOSIS — Z1151 Encounter for screening for human papillomavirus (HPV): Secondary | ICD-10-CM | POA: Insufficient documentation

## 2013-09-22 DIAGNOSIS — Z23 Encounter for immunization: Secondary | ICD-10-CM

## 2013-09-22 DIAGNOSIS — Z01419 Encounter for gynecological examination (general) (routine) without abnormal findings: Secondary | ICD-10-CM | POA: Insufficient documentation

## 2013-09-22 DIAGNOSIS — Z124 Encounter for screening for malignant neoplasm of cervix: Secondary | ICD-10-CM

## 2013-09-22 DIAGNOSIS — Z6841 Body Mass Index (BMI) 40.0 and over, adult: Secondary | ICD-10-CM

## 2013-09-22 DIAGNOSIS — Z0000 Encounter for general adult medical examination without abnormal findings: Secondary | ICD-10-CM

## 2013-09-22 DIAGNOSIS — Z113 Encounter for screening for infections with a predominantly sexual mode of transmission: Secondary | ICD-10-CM | POA: Insufficient documentation

## 2013-09-22 MED ORDER — LORCASERIN HCL 10 MG PO TABS: ORAL_TABLET | ORAL | Status: DC

## 2013-09-22 NOTE — Assessment & Plan Note (Signed)
belviq rx rto 1 month

## 2013-09-22 NOTE — Addendum Note (Signed)
Addended by: Arnette NorrisPAYNE, Emrey Thornley P on: 09/22/2013 01:20 PM   Modules accepted: Orders

## 2013-09-22 NOTE — Progress Notes (Signed)
Pre visit review using our clinic review tool, if applicable. No additional management support is needed unless otherwise documented below in the visit note. 

## 2013-09-22 NOTE — Patient Instructions (Signed)

## 2013-09-22 NOTE — Progress Notes (Signed)
Subjective:     Lindsey Figueroa is a 28 y.o. female and is here for a comprehensive physical exam. The patient reports problems - obesity and weight loss.  History   Social History  . Marital Status: Single    Spouse Name: N/A    Number of Children: N/A  . Years of Education: N/A   Occupational History  . Not on file.   Social History Main Topics  . Smoking status: Never Smoker   . Smokeless tobacco: Never Used  . Alcohol Use: No  . Drug Use: No  . Sexual Activity: Not on file   Other Topics Concern  . Not on file   Social History Narrative   No exercise   Health Maintenance  Topic Date Due  . Tetanus/tdap  10/11/2002  . Influenza Vaccine  02/14/2013  . Pap Smear  08/03/2014    The following portions of the patient's history were reviewed and updated as appropriate:  She  has no past medical history on file. She  does not have any pertinent problems on file. She  has past surgical history that includes Wisdom tooth extraction and Tonsillectomy and adenoidectomy. Her family history includes Cancer - Lung in her maternal grandfather; Diabetes in her mother; Hypertension in her maternal grandmother. She  reports that she has never smoked. She has never used smokeless tobacco. She reports that she does not drink alcohol or use illicit drugs. She currently has no medications in their medication list. No current outpatient prescriptions on file prior to visit.   No current facility-administered medications on file prior to visit.   She has No Known Allergies..  Review of Systems Review of Systems  Constitutional: Negative for activity change, appetite change and fatigue.  HENT: Negative for hearing loss, congestion, tinnitus and ear discharge.  dentist q32m Eyes: Negative for visual disturbance (see optho-- due) Respiratory: Negative for cough, chest tightness and shortness of breath.   Cardiovascular: Negative for chest pain, palpitations and leg swelling.   Gastrointestinal: Negative for abdominal pain, diarrhea, constipation and abdominal distention.  Genitourinary: Negative for urgency, frequency, decreased urine volume and difficulty urinating.  Musculoskeletal: Negative for back pain, arthralgias and gait problem.  Skin: Negative for color change, pallor and rash.  Neurological: Negative for dizziness, light-headedness, numbness and headaches.  Hematological: Negative for adenopathy. Does not bruise/bleed easily.  Psychiatric/Behavioral: Negative for suicidal ideas, confusion, sleep disturbance, self-injury, dysphoric mood, decreased concentration and agitation.       Objective:    BP 118/72  Pulse 85  Temp(Src) 98.3 F (36.8 C) (Oral)  Ht 5' 6.5" (1.689 m)  Wt 329 lb (149.233 kg)  BMI 52.31 kg/m2  SpO2 97%  LMP 09/02/2013 General appearance: alert, cooperative, appears stated age and no distress Head: Normocephalic, without obvious abnormality, atraumatic Eyes: conjunctivae/corneas clear. PERRL, EOM's intact. Fundi benign. Ears: normal TM's and external ear canals both ears Nose: Nares normal. Septum midline. Mucosa normal. No drainage or sinus tenderness. Throat: lips, mucosa, and tongue normal; teeth and gums normal Neck: no adenopathy, no carotid bruit, no JVD, supple, symmetrical, trachea midline and thyroid not enlarged, symmetric, no tenderness/mass/nodules Back: symmetric, no curvature. ROM normal. No CVA tenderness. Lungs: clear to auscultation bilaterally Breasts: normal appearance, no masses or tenderness Heart: S1, S2 normal Abdomen: soft, non-tender; bowel sounds normal; no masses,  no organomegaly Pelvic: cervix normal in appearance, external genitalia normal, no adnexal masses or tenderness, no cervical motion tenderness, rectovaginal septum normal, uterus normal size, shape, and consistency, vagina normal  without discharge and pap done Extremities: extremities normal, atraumatic, no cyanosis or edema Pulses: 2+  and symmetric Skin: Skin color, texture, turgor normal. No rashes or lesions Lymph nodes: Cervical, supraclavicular, and axillary nodes normal. Neurologic: Alert and oriented X 3, normal strength and tone. Normal symmetric reflexes. Normal coordination and gait Psych-- no depression, no anxiety      Assessment:    Healthy female exam.       Plan:    ghm utd Check labs See After Visit Summary for Counseling Recommendations

## 2013-09-23 NOTE — Telephone Encounter (Signed)
Unable to reach pre visit.  

## 2013-09-25 ENCOUNTER — Other Ambulatory Visit: Payer: BC Managed Care – PPO

## 2013-10-21 ENCOUNTER — Ambulatory Visit: Payer: BC Managed Care – PPO | Admitting: Family Medicine

## 2013-11-11 ENCOUNTER — Encounter: Payer: Self-pay | Admitting: Family Medicine

## 2013-11-11 ENCOUNTER — Ambulatory Visit (INDEPENDENT_AMBULATORY_CARE_PROVIDER_SITE_OTHER): Payer: BC Managed Care – PPO | Admitting: Family Medicine

## 2013-11-11 VITALS — BP 108/70 | HR 62 | Temp 98.3°F | Wt 318.6 lb

## 2013-11-11 DIAGNOSIS — R079 Chest pain, unspecified: Secondary | ICD-10-CM

## 2013-11-11 MED ORDER — NALTREXONE-BUPROPION HCL ER 8-90 MG PO TB12: ORAL_TABLET | ORAL | Status: DC

## 2013-11-11 NOTE — Progress Notes (Signed)
Pre visit review using our clinic review tool, if applicable. No additional management support is needed unless otherwise documented below in the visit note. 

## 2013-11-11 NOTE — Patient Instructions (Signed)
Obesity Obesity is defined as having too much total body fat and a body mass index (BMI) of 30 or more. BMI is an estimate of body fat and is calculated from your height and weight. Obesity happens when you consume more calories than you can burn by exercising or performing daily physical tasks. Prolonged obesity can cause major illnesses or emergencies, such as:   A stroke.  Heart disease.  Diabetes.  Cancer.  Arthritis.  High blood pressure (hypertension).  High cholesterol.  Sleep apnea.  Erectile dysfunction.  Infertility problems. CAUSES   Regularly eating unhealthy foods.  Physical inactivity.  Certain disorders, such as an underactive thyroid (hypothyroidism), Cushing's syndrome, and polycystic ovarian syndrome.  Certain medicines, such as steroids, some depression medicines, and antipsychotics.  Genetics.  Lack of sleep. DIAGNOSIS  A caregiver can diagnose obesity after calculating your BMI. Obesity will be diagnosed if your BMI is 30 or higher.  There are other methods of measuring obesity levels. Some other methods include measuring your skin fold thickness, your waist circumference, and comparing your hip circumference to your waist circumference. TREATMENT  A healthy treatment program includes some or all of the following:  Long-term dietary changes.  Exercise and physical activity.  Behavioral and lifestyle changes.  Medicine only under the supervision of your caregiver. Medicines may help, but only if they are used with diet and exercise programs. An unhealthy treatment program includes:  Fasting.  Fad diets.  Supplements and drugs. These choices do not succeed in long-term weight control.  HOME CARE INSTRUCTIONS   Exercise and perform physical activity as directed by your caregiver. To increase physical activity, try the following:  Use stairs instead of elevators.  Park farther away from store entrances.  Garden, bike, or walk instead of  watching television or using the computer.  Eat healthy, low-calorie foods and drinks on a regular basis. Eat more fruits and vegetables. Use low-calorie cookbooks or take healthy cooking classes.  Limit fast food, sweets, and processed snack foods.  Eat smaller portions.  Keep a daily journal of everything you eat. There are many free websites to help you with this. It may be helpful to measure your foods so you can determine if you are eating the correct portion sizes.  Avoid drinking alcohol. Drink more water and drinks without calories.  Take vitamins and supplements only as recommended by your caregiver.  Weight-loss support groups, Registered Dieticians, counselors, and stress reduction education can also be very helpful. SEEK IMMEDIATE MEDICAL CARE IF:  You have chest pain or tightness.  You have trouble breathing or feel short of breath.  You have weakness or leg numbness.  You feel confused or have trouble talking.  You have sudden changes in your vision. MAKE SURE YOU:  Understand these instructions.  Will watch your condition.  Will get help right away if you are not doing well or get worse. Document Released: 08/10/2004 Document Revised: 01/02/2012 Document Reviewed: 08/09/2011 [employer] Patient Information 2014 [employer].  

## 2013-11-11 NOTE — Progress Notes (Signed)
   Subjective:    Patient ID: Lindsey Figueroa, female    DOB: 08-Sep-1985, 28 y.o.   MRN: [id number]  HPI Pt here to f/u belviq-- it caused chest pain and heaviness.  No sob.  It stopped when med stopped. No other complaints.     Review of Systems As above    Objective:   Physical Exam  BP 108/70  Pulse 62  Temp(Src) 98.3 F (36.8 C) (Oral)  Wt 318 lb 9.6 oz (144.516 kg)  SpO2 99%  LMP 10/29/2013 General appearance: alert, cooperative, appears stated age and no distress Neck: no adenopathy, supple, symmetrical, trachea midline and thyroid not enlarged, symmetric, no tenderness/mass/nodules Lungs: clear to auscultation bilaterally Heart: regular rate and rhythm, S1, S2 normal, no murmur, click, rub or gallop Extremities: extremities normal, atraumatic, no cyanosis or edema      Assessment & Plan:  1. Chest pain nsr  - EKG 12-Lead - Naltrexone-Bupropion HCl ER (CONTRAVE) 8-90 MG TB12; 1 po qam x 1 week then 1 po bid for 1 week then 2 po qam and 1 po qpm x 1 week , then 2 po bid  Dispense: 120 tablet; Refill: 5  2. Morbid obesity con't diet and exercise--- f/u 1 month or sooner prn - Naltrexone-Bupropion HCl ER (CONTRAVE) 8-90 MG TB12; 1 po qam x 1 week then 1 po bid for 1 week then 2 po qam and 1 po qpm x 1 week , then 2 po bid  Dispense: 120 tablet; Refill: 5

## 2013-12-10 ENCOUNTER — Encounter: Payer: Self-pay | Admitting: Family Medicine

## 2013-12-10 ENCOUNTER — Other Ambulatory Visit (HOSPITAL_COMMUNITY)
Admission: RE | Admit: 2013-12-10 | Discharge: 2013-12-10 | Disposition: A | Discharge status: Home / Self Care | Payer: BC Managed Care – PPO | Source: Ambulatory Visit | Attending: Family Medicine | Admitting: Family Medicine

## 2013-12-10 ENCOUNTER — Ambulatory Visit (INDEPENDENT_AMBULATORY_CARE_PROVIDER_SITE_OTHER): Payer: BC Managed Care – PPO | Admitting: Family Medicine

## 2013-12-10 DIAGNOSIS — N760 Acute vaginitis: Secondary | ICD-10-CM | POA: Insufficient documentation

## 2013-12-10 DIAGNOSIS — R8290 Unspecified abnormal findings in urine: Secondary | ICD-10-CM

## 2013-12-10 DIAGNOSIS — R82998 Other abnormal findings in urine: Secondary | ICD-10-CM

## 2013-12-10 DIAGNOSIS — Z113 Encounter for screening for infections with a predominantly sexual mode of transmission: Secondary | ICD-10-CM | POA: Insufficient documentation

## 2013-12-10 DIAGNOSIS — Z7251 High risk heterosexual behavior: Secondary | ICD-10-CM

## 2013-12-10 LAB — POCT URINALYSIS DIPSTICK
Bilirubin, UA: NEGATIVE
Glucose, UA: NEGATIVE
Ketones, UA: NEGATIVE
NITRITE UA: NEGATIVE
PH UA: 6
Protein, UA: NEGATIVE
Spec Grav, UA: 1.02
UROBILINOGEN UA: 0.2

## 2013-12-10 LAB — POCT URINE PREGNANCY: Preg Test, Ur: NEGATIVE

## 2013-12-10 MED ORDER — LORCASERIN HCL 10 MG PO TABS: ORAL_TABLET | ORAL | Status: DC

## 2013-12-10 MED ORDER — FLUCONAZOLE 150 MG PO TABS: 150.0000 mg | ORAL_TABLET | Freq: Once | ORAL | Status: DC

## 2013-12-10 NOTE — Progress Notes (Signed)
   Subjective:    Patient ID: Lindsey Figueroa, female    DOB: February 10, 1986, 28 y.o.   MRN: [id number]  HPI Pt here asking to be tested for std.  She denies odor or D/C but c/o vaginal itching.  She is also here for weight check.  The contrave was too expensive so she went back to [employer] [facility].  She said it is not causing any cp like before.     Review of Systems    as above  Objective:   Physical Exam BP 114/78  Pulse 73  Temp(Src) 98.6 F (37 C) (Oral)  Wt 318 lb 6.4 oz (144.425 kg)  SpO2 97%  LMP 11/29/2013 General appearance: alert, cooperative, appears stated age and no distress Neck: no adenopathy, supple, symmetrical, trachea midline and thyroid not enlarged, symmetric, no tenderness/mass/nodules Lungs: clear to auscultation bilaterally Heart: regular rate and rhythm, S1, S2 normal, no murmur, click, rub or gallop Abdomen: soft, non-tender; bowel sounds normal; no masses,  no organomegaly Pelvic: cervix normal in appearance, positive findings: vaginal discharge:  copious, white and thick, uterus normal size, shape, and consistency and vagina normal without discharge Extremities: extremities normal, atraumatic, no cyanosis or edema       Assessment & Plan:  1. Morbid obesity con't diet and exercise - Lorcaserin HCl (BELVIQ) 10 MG TABS; 1 po bid  Dispense: 60 tablet; Refill: 2  2. High risk sexual behavior Check labs - HIV antibody - POCT urinalysis dipstick - POCT urine pregnancy - WET PREP FOR TRICH, YEAST, CLUE - HSV 2 antibody, IgG - RPR - GC/chlamydia probe amp, genital  3. Vaginitis and vulvovaginitis con't meds cultures - fluconazole (DIFLUCAN) 150 MG tablet; Take 1 tablet (150 mg total) by mouth once. May repeat in 3 days prn  Dispense: 2 tablet; Refill: 2

## 2013-12-10 NOTE — Progress Notes (Signed)
Pre visit review using our clinic review tool, if applicable. No additional management support is needed unless otherwise documented below in the visit note. 

## 2013-12-10 NOTE — Patient Instructions (Signed)

## 2013-12-10 NOTE — Addendum Note (Signed)
Addended by: Arnette Norris on: 12/10/2013 08:59 AM   Modules accepted: Orders

## 2013-12-10 NOTE — Addendum Note (Signed)
Addended by: Silvio Pate D on: 12/10/2013 04:44 PM   Modules accepted: Orders

## 2013-12-11 ENCOUNTER — Telehealth: Payer: Self-pay | Admitting: *Deleted

## 2013-12-11 LAB — RPR

## 2013-12-11 LAB — HIV ANTIBODY (ROUTINE TESTING W REFLEX): HIV: NONREACTIVE

## 2013-12-11 LAB — HSV 2 ANTIBODY, IGG: HSV 2 Glycoprotein G Ab, IgG: <0.1 IV

## 2013-12-11 MED ORDER — METRONIDAZOLE 0.75 % VA GEL: VAGINAL | Status: DC

## 2013-12-11 NOTE — Telephone Encounter (Signed)
Message copied by Verdie Shire on Thu Dec 11, 2013 10:28 AM ------      Message from: Lelon Perla      Created: Thu Dec 11, 2013  8:33 AM       + Bacterial vaginosis--- metrogel 1 app per vagina per night x 5 ------

## 2013-12-11 NOTE — Telephone Encounter (Signed)
Spoke with the pt and informed her of recent lab results and note.  Pt understood and agreed.  New rx sent to the pharmacy by e-script.//AB/CMA

## 2013-12-12 LAB — URINE CULTURE: Colony Count: >=100000

## 2014-01-13 ENCOUNTER — Ambulatory Visit: Payer: BC Managed Care – PPO | Admitting: Family Medicine

## 2014-01-27 ENCOUNTER — Encounter: Payer: Self-pay | Admitting: Family Medicine

## 2014-01-27 ENCOUNTER — Ambulatory Visit (INDEPENDENT_AMBULATORY_CARE_PROVIDER_SITE_OTHER): Payer: BC Managed Care – PPO | Admitting: Family Medicine

## 2014-01-27 VITALS — BP 110/60 | HR 63 | Temp 98.1°F | Wt 311.0 lb

## 2014-01-27 DIAGNOSIS — Z3009 Encounter for other general counseling and advice on contraception: Secondary | ICD-10-CM

## 2014-01-27 DIAGNOSIS — Z30011 Encounter for initial prescription of contraceptive pills: Secondary | ICD-10-CM

## 2014-01-27 MED ORDER — LORCASERIN HCL 10 MG PO TABS: ORAL_TABLET | ORAL | Status: DC

## 2014-01-27 MED ORDER — NORGESTIMATE-ETH ESTRADIOL 0.25-35 MG-MCG PO TABS: 1.0000 | ORAL_TABLET | Freq: Every day | ORAL | Status: DC

## 2014-01-27 NOTE — Progress Notes (Signed)
   Subjective:    Patient ID: Lindsey Figueroa, female    DOB: [birth year], 28 y.o.   MRN: [id number]  HPI Pt here f/u weight. She is still exercising and watching what she eats.  Pt also wants to see a gyn for IUD   Review of Systems As above     Objective:   Physical Exam BP 110/60  Pulse 63  Temp(Src) 98.1 F (36.7 C) (Oral)  Wt 311 lb (141.069 kg)  SpO2 98%  LMP 01/13/2014 General appearance: alert, cooperative, appears stated age and no distress Nose: Nares normal. Septum midline. Mucosa normal. No drainage or sinus tenderness. Throat: lips, mucosa, and tongue normal; teeth and gums normal Neck: no adenopathy, no carotid bruit, no JVD, supple, symmetrical, trachea midline and thyroid not enlarged, symmetric, no tenderness/mass/nodules Lungs: clear to auscultation bilaterally Heart: S1, S2 normal Abdomen: soft, non-tender; bowel sounds normal; no masses,  no organomegaly Neurologic: Alert and oriented X 3, normal strength and tone. Normal symmetric reflexes. Normal coordination and gait        Assessment & Plan:  1. Encounter for initial prescription of contraceptive pills Pt is requesting IUD - Ambulatory referral to Gynecology - norgestimate-ethinyl estradiol (ORTHO-CYCLEN,SPRINTEC,PREVIFEM) 0.25-35 MG-MCG tablet; Take 1 tablet by mouth daily.  Dispense: 1 Package; Refill: 11  2. Morbid obesity con't exerecise,  Refill meds - Lorcaserin HCl (BELVIQ) 10 MG TABS; 1 po bid  Dispense: 60 tablet; Refill: 2

## 2014-01-27 NOTE — Patient Instructions (Signed)
Contraception Choices Contraception (birth control) is the use of any methods or devices to prevent pregnancy. Below are some methods to help avoid pregnancy. HORMONAL METHODS   Contraceptive implant. This is a thin, plastic tube containing progesterone hormone. It does not contain estrogen hormone. Your health care provider inserts the tube in the inner part of the upper arm. The tube can remain in place for up to 3 years. After 3 years, the implant must be removed. The implant prevents the ovaries from releasing an egg (ovulation), thickens the cervical mucus to prevent sperm from entering the uterus, and thins the lining of the inside of the uterus.  Progesterone-only injections. These injections are given every 3 months by your health care provider to prevent pregnancy. This synthetic progesterone hormone stops the ovaries from releasing eggs. It also thickens cervical mucus and changes the uterine lining. This makes it harder for sperm to survive in the uterus.  Birth control pills. These pills contain estrogen and progesterone hormone. They work by preventing the ovaries from releasing eggs (ovulation). They also cause the cervical mucus to thicken, preventing the sperm from entering the uterus. Birth control pills are prescribed by a health care provider.Birth control pills can also be used to treat heavy periods.  Minipill. This type of birth control pill contains only the progesterone hormone. They are taken every day of each month and must be prescribed by your health care provider.  Birth control patch. The patch contains hormones similar to those in birth control pills. It must be changed once a week and is prescribed by a health care provider.  Vaginal ring. The ring contains hormones similar to those in birth control pills. It is left in the vagina for 3 weeks, removed for 1 week, and then a new one is put back in place. The patient must be comfortable inserting and removing the ring  from the vagina.A health care provider's prescription is necessary.  Emergency contraception. Emergency contraceptives prevent pregnancy after unprotected sexual intercourse. This pill can be taken right after sex or up to 5 days after unprotected sex. It is most effective the sooner you take the pills after having sexual intercourse. Most emergency contraceptive pills are available without a prescription. Check with your pharmacist. Do not use emergency contraception as your only form of birth control. BARRIER METHODS   Female condom. This is a thin sheath (latex or rubber) that is worn over the penis during sexual intercourse. It can be used with spermicide to increase effectiveness.  Female condom. This is a soft, loose-fitting sheath that is put into the vagina before sexual intercourse.  Diaphragm. This is a soft, latex, dome-shaped barrier that must be fitted by a health care provider. It is inserted into the vagina, along with a spermicidal jelly. It is inserted before intercourse. The diaphragm should be left in the vagina for 6 to 8 hours after intercourse.  Cervical cap. This is a round, soft, latex or plastic cup that fits over the cervix and must be fitted by a health care provider. The cap can be left in place for up to 48 hours after intercourse.  Sponge. This is a soft, circular piece of polyurethane foam. The sponge has spermicide in it. It is inserted into the vagina after wetting it and before sexual intercourse.  Spermicides. These are chemicals that kill or block sperm from entering the cervix and uterus. They come in the form of creams, jellies, suppositories, foam, or tablets. They do not require a   prescription. They are inserted into the vagina with an applicator before having sexual intercourse. The process must be repeated every time you have sexual intercourse. INTRAUTERINE CONTRACEPTION  Intrauterine device (IUD). This is a T-shaped device that is put in a woman's uterus  during a menstrual period to prevent pregnancy. There are 2 types:  Copper IUD. This type of IUD is wrapped in copper wire and is placed inside the uterus. Copper makes the uterus and fallopian tubes produce a fluid that kills sperm. It can stay in place for 10 years.  Hormone IUD. This type of IUD contains the hormone progestin (synthetic progesterone). The hormone thickens the cervical mucus and prevents sperm from entering the uterus, and it also thins the uterine lining to prevent implantation of a fertilized egg. The hormone can weaken or kill the sperm that get into the uterus. It can stay in place for 3-5 years, depending on which type of IUD is used. PERMANENT METHODS OF CONTRACEPTION  Female tubal ligation. This is when the woman's fallopian tubes are surgically sealed, tied, or blocked to prevent the egg from traveling to the uterus.  Hysteroscopic sterilization. This involves placing a small coil or insert into each fallopian tube. Your doctor uses a technique called hysteroscopy to do the procedure. The device causes scar tissue to form. This results in permanent blockage of the fallopian tubes, so the sperm cannot fertilize the egg. It takes about 3 months after the procedure for the tubes to become blocked. You must use another form of birth control for these 3 months.  Female sterilization. This is when the female has the tubes that carry sperm tied off (vasectomy).This blocks sperm from entering the vagina during sexual intercourse. After the procedure, the man can still ejaculate fluid (semen). NATURAL PLANNING METHODS  Natural family planning. This is not having sexual intercourse or using a barrier method (condom, diaphragm, cervical cap) on days the woman could become pregnant.  Calendar method. This is keeping track of the length of each menstrual cycle and identifying when you are fertile.  Ovulation method. This is avoiding sexual intercourse during ovulation.  Symptothermal  method. This is avoiding sexual intercourse during ovulation, using a thermometer and ovulation symptoms.  Post-ovulation method. This is timing sexual intercourse after you have ovulated. Regardless of which type or method of contraception you choose, it is important that you use condoms to protect against the transmission of sexually transmitted infections (STIs). Talk with your health care provider about which form of contraception is most appropriate for you. Document Released: 07/03/2005 Document Revised: 07/08/2013 Document Reviewed: 12/26/2012 [employer] Patient Information 2015 [employer]. This information is not intended to replace advice given to you by your health care provider. Make sure you discuss any questions you have with your health care provider.  

## 2014-01-27 NOTE — Progress Notes (Signed)
Pre visit review using our clinic review tool, if applicable. No additional management support is needed unless otherwise documented below in the visit note. 

## 2014-03-12 ENCOUNTER — Ambulatory Visit: Payer: Self-pay | Admitting: Family Medicine

## 2014-03-24 ENCOUNTER — Telehealth: Payer: Self-pay | Admitting: Family Medicine

## 2014-03-24 NOTE — Telephone Encounter (Signed)
LVM regarding scheduling appointment.

## 2014-03-24 NOTE — Telephone Encounter (Signed)
Caller name: Meeah  Relation to pt: self  Call back number: [phone] Pharmacy: [facility] [phone]   Reason for call:   pt requesting a RX for diflucan pt has a reoccurring yeast infection.

## 2014-03-24 NOTE — Telephone Encounter (Signed)
She will need an OV.     KP 

## 2014-03-26 ENCOUNTER — Encounter: Payer: Self-pay | Admitting: Family Medicine

## 2014-03-26 ENCOUNTER — Ambulatory Visit (INDEPENDENT_AMBULATORY_CARE_PROVIDER_SITE_OTHER): Payer: BC Managed Care – PPO | Admitting: Family Medicine

## 2014-03-26 ENCOUNTER — Other Ambulatory Visit (HOSPITAL_COMMUNITY)
Admission: RE | Admit: 2014-03-26 | Discharge: 2014-03-26 | Disposition: A | Discharge status: Home / Self Care | Payer: BC Managed Care – PPO | Source: Ambulatory Visit | Attending: Family Medicine | Admitting: Family Medicine

## 2014-03-26 VITALS — BP 126/90 | HR 59 | Temp 98.7°F | Wt 302.9 lb

## 2014-03-26 DIAGNOSIS — N760 Acute vaginitis: Secondary | ICD-10-CM

## 2014-03-26 MED ORDER — FLUCONAZOLE 150 MG PO TABS: ORAL_TABLET | ORAL | Status: DC

## 2014-03-26 NOTE — Progress Notes (Signed)
Pre visit review using our clinic review tool, if applicable. No additional management support is needed unless otherwise documented below in the visit note. 

## 2014-03-26 NOTE — Progress Notes (Signed)
   Subjective:    Patient ID: Lindsey Figueroa, female    DOB: [birth year], 28 y.o.   MRN: [id number]  HPI Pt here c/o vaginal d/c, no odor  No abd pain   Review of Systems  above     Objective:   Physical Exam BP 126/90  Pulse 59  Temp(Src) 98.7 F (37.1 C) (Oral)  Wt 302 lb 14.6 oz (137.4 kg)  SpO2 100% General appearance: alert, cooperative, appears stated age and no distress Abdomen: soft, non-tender; bowel sounds normal; no masses,  no organomegaly Pelvic: external genitalia normal and positive findings: vaginal d/c no odo-cultures done        Assessment & Plan:  1. Vaginitis and vulvovaginitis Check culture - fluconazole (DIFLUCAN) 150 MG tablet; 1 po qd x 1 , may repeat in 3 days prn  Dispense: 2 tablet; Refill: 2 - Cervicovaginal ancillary only

## 2014-03-26 NOTE — Patient Instructions (Signed)

## 2014-03-27 ENCOUNTER — Other Ambulatory Visit: Payer: Self-pay

## 2014-03-27 MED ORDER — METRONIDAZOLE 500 MG PO TABS: 500.0000 mg | ORAL_TABLET | Freq: Two times a day (BID) | ORAL | Status: DC

## 2014-03-31 ENCOUNTER — Ambulatory Visit: Payer: Self-pay | Admitting: Family Medicine

## 2014-04-23 ENCOUNTER — Other Ambulatory Visit: Payer: Self-pay | Admitting: Family Medicine

## 2014-04-23 NOTE — Telephone Encounter (Signed)
Last seen 03/26/14 and filled 01/27/14 #60 with 2 refills. Please advise     KP

## 2015-02-05 ENCOUNTER — Emergency Department (HOSPITAL_COMMUNITY)
Admission: EM | Admit: 2015-02-05 | Discharge: 2015-02-05 | Disposition: A | Discharge status: Home / Self Care | Payer: Self-pay | Attending: Emergency Medicine | Admitting: Emergency Medicine

## 2015-02-05 ENCOUNTER — Encounter (HOSPITAL_COMMUNITY): Payer: Self-pay | Admitting: Emergency Medicine

## 2015-02-05 DIAGNOSIS — Z792 Long term (current) use of antibiotics: Secondary | ICD-10-CM | POA: Insufficient documentation

## 2015-02-05 DIAGNOSIS — K0889 Other specified disorders of teeth and supporting structures: Secondary | ICD-10-CM

## 2015-02-05 DIAGNOSIS — K088 Other specified disorders of teeth and supporting structures: Secondary | ICD-10-CM | POA: Insufficient documentation

## 2015-02-05 DIAGNOSIS — K047 Periapical abscess without sinus: Secondary | ICD-10-CM

## 2015-02-05 DIAGNOSIS — Z79899 Other long term (current) drug therapy: Secondary | ICD-10-CM | POA: Insufficient documentation

## 2015-02-05 DIAGNOSIS — Z9889 Other specified postprocedural states: Secondary | ICD-10-CM | POA: Insufficient documentation

## 2015-02-05 MED ORDER — HYDROCODONE-ACETAMINOPHEN 5-325 MG PO TABS
1.0000 | ORAL_TABLET | Freq: Once | ORAL | Status: AC
  Administered 2015-02-05: 1 via ORAL
  Filled 2015-02-05: qty 1

## 2015-02-05 MED ORDER — HYDROCODONE-ACETAMINOPHEN 5-325 MG PO TABS
1.0000 | ORAL_TABLET | Freq: Four times a day (QID) | ORAL | Status: DC | PRN
Start: 2015-02-05 — End: 2016-02-22

## 2015-02-05 MED ORDER — NAPROXEN 500 MG PO TABS: 500.0000 mg | ORAL_TABLET | Freq: Two times a day (BID) | ORAL | Status: DC | PRN

## 2015-02-05 MED ORDER — DOXYCYCLINE HYCLATE 100 MG PO CAPS: 100.0000 mg | ORAL_CAPSULE | Freq: Two times a day (BID) | ORAL | Status: DC

## 2015-02-05 NOTE — ED Provider Notes (Signed)
CSN: [id number]     Arrival date & time 02/05/15  1110 History   First MD Initiated Contact with Patient 02/05/15 1124     Chief Complaint  Patient presents with  . Dental Pain    R lower, x3 days     (Consider location/radiation/quality/duration/timing/severity/associated sxs/prior Treatment) HPI Comments: Lindsey Figueroa is a 29 y.o. female with a PSHx of tonsil/adenoidectomy and 1 wisdom tooth extraction (not all 4), who presents to the ED with complaints of right lower molar dental pain in tooth #32. She reports the pain is 10/10 constant for the last 3 days sharp and throbbing, nonradiating, worse with chewing, and relieved mildly with ibuprofen. Associated symptoms include gum swelling. She denies any come drainage or bleeding, facial swelling or neck swelling, neck pain, trismus, drooling, fevers, chills, ear pain or drainage, rhinorrhea, chest pain, shortness breath, abdominal pain, nausea vomiting, diarrhea, dysuria, hematuria, numbness, tingling, or weakness. She is a nonsmoker. She has a Education officer, community Dr. Maurice March, but she does not have dental insurance right now but her insurance starts over in 1 week at which time she will be able to see him.  Patient is a 29 y.o. female presenting with tooth pain. The history is provided by the patient. No language interpreter was used.  Dental Pain Location:  Lower Lower teeth location:  32/RL 3rd molar Quality:  Sharp and throbbing Severity:  Severe Onset quality:  Gradual Duration:  3 days Timing:  Constant Progression:  Unchanged Chronicity:  New Context: abscess   Relieved by:  NSAIDs Worsened by:  Pressure Ineffective treatments:  None tried Associated symptoms: gum swelling   Associated symptoms: no difficulty swallowing, no drooling, no facial swelling, no fever, no neck pain, no neck swelling, no oral bleeding, no oral lesions and no trismus   Risk factors: lack of dental care     No past medical history on file. Past Surgical History    Procedure Laterality Date  . Wisdom tooth extraction    . Tonsillectomy and adenoidectomy     Family History  Problem Relation Age of Onset  . Diabetes Mother   . Cancer - Lung Maternal Grandfather     Non smoker  . Hypertension Maternal Grandmother    History  Substance Use Topics  . Smoking status: Never Smoker   . Smokeless tobacco: Never Used  . Alcohol Use: No   OB History    No data available     Review of Systems  Constitutional: Negative for fever and chills.  HENT: Positive for dental problem. Negative for drooling, ear discharge, ear pain, facial swelling, mouth sores, rhinorrhea, sore throat and trouble swallowing.   Respiratory: Negative for shortness of breath.   Cardiovascular: Negative for chest pain.  Gastrointestinal: Negative for nausea, vomiting, abdominal pain and diarrhea.  Genitourinary: Negative for dysuria and hematuria.  Musculoskeletal: Negative for myalgias, arthralgias and neck pain.  Skin: Negative for color change.  Allergic/Immunologic: Negative for immunocompromised state.  Neurological: Negative for weakness and numbness.  Psychiatric/Behavioral: Negative for confusion.   10 Systems reviewed and are negative for acute change except as noted in the HPI.    Allergies  Review of patient's allergies indicates no known allergies.  Home Medications   Prior to Admission medications   Medication Sig Start Date End Date Taking? Authorizing Provider  BELVIQ 10 MG TABS TAKE ONE TABLET BY MOUTH TWICE DAILY  04/23/14   Bradd Canary, MD  fluconazole (DIFLUCAN) 150 MG tablet 1 po qd x 1 ,  may repeat in 3 days prn 03/26/14   Lelon Perla, DO  levonorgestrel (MIRENA) 20 MCG/24HR IUD 1 each by Intrauterine route once.    Historical Provider, MD  metroNIDAZOLE (FLAGYL) 500 MG tablet Take 1 tablet (500 mg total) by mouth 2 (two) times daily. 03/27/14   Yvonne R Lowne, DO   BP 140/77 mmHg  Pulse 60  Temp(Src) 98.5 F (36.9 C) (Oral)  Resp 17  SpO2  100% Physical Exam  Constitutional: She is oriented to person, place, and time. Vital signs are normal. She appears well-developed and well-nourished.  Non-toxic appearance. No distress.  Afebrile, nontoxic, NAD  HENT:  Head: Normocephalic and atraumatic.  Nose: Nose normal.  Mouth/Throat: Uvula is midline, oropharynx is clear and moist and mucous membranes are normal. No trismus in the jaw. Dental abscesses present. No uvula swelling.    Tooth #32 with TTP, erupting through gumline with slight surrounding gingival erythema and small pocket of possible developing infection just over the tooth near the gumline, no evidence of ludwig's, no drainage. Nose clear. Oropharynx clear and moist, without uvular swelling or deviation, no trismus or drooling, no tonsillar swelling or erythema, no exudates.    Eyes: Conjunctivae and EOM are normal. Right eye exhibits no discharge. Left eye exhibits no discharge.  Neck: Normal range of motion. Neck supple.  Cardiovascular: Normal rate.   Pulmonary/Chest: Effort normal. No respiratory distress.  Abdominal: Normal appearance. She exhibits no distension.  Musculoskeletal: Normal range of motion.  Lymphadenopathy:       Head (right side): Submandibular adenopathy present.  R submandibular reactive LAD which is mildly TTP  Neurological: She is alert and oriented to person, place, and time. She has normal strength. No sensory deficit.  Skin: Skin is warm, dry and intact. No rash noted.  Psychiatric: She has a normal mood and affect. Her behavior is normal.  Nursing note and vitals reviewed.   ED Course  Procedures (including critical care time) Labs Review Labs Reviewed - No data to display  Imaging Review No results found.   EKG Interpretation None      MDM   Final diagnoses:  Pain, dental  Dental infection    29 y.o. female here with Dental pain associated with dental infection and possible dental abscess with patient afebrile, non toxic  appearing and swallowing secretions well. I gave patient referral to dentist and stressed the importance of dental follow up for ultimate management of dental pain. She has a Education officer, community but her dental insurance has run out, advised that she either call one from the list or schedule an appt with her dentist in 1wk when her insurance starts over. I have also discussed reasons to return immediately to the ER.  Patient expresses understanding and agrees with plan.  I will also give doxycycline and pain control.    BP 140/77 mmHg  Pulse 60  Temp(Src) 98.5 F (36.9 C) (Oral)  Resp 17  SpO2 100%  LMP 01/31/2015  Meds ordered this encounter  Medications  . HYDROcodone-acetaminophen (NORCO/VICODIN) 5-325 MG per tablet 1 tablet    Sig:   . doxycycline (VIBRAMYCIN) 100 MG capsule    Sig: Take 1 capsule (100 mg total) by mouth 2 (two) times daily. One po bid x 7 days    Dispense:  14 capsule    Refill:  0    Order Specific Question:  Supervising Provider    Answer:  MILLER, BRIAN [[id number]]  . HYDROcodone-acetaminophen (NORCO) 5-325 MG per tablet  Sig: Take 1 tablet by mouth every 6 (six) hours as needed for severe pain.    Dispense:  6 tablet    Refill:  0    Order Specific Question:  Supervising Provider    Answer:  MILLER, BRIAN [[id number]]  . naproxen (NAPROSYN) 500 MG tablet    Sig: Take 1 tablet (500 mg total) by mouth 2 (two) times daily as needed for mild pain, moderate pain or headache (TAKE WITH MEALS.).    Dispense:  20 tablet    Refill:  0    Order Specific Question:  Supervising Provider    Answer:  Eber Hong [[id number]]      Jakerra Floyd Camprubi-Soms, PA-C 02/05/15 1142  Raeford Razor, MD 02/07/15 [phone]

## 2015-02-05 NOTE — ED Notes (Signed)
Pt A+ox4, reports R lower dental pain x2 days.  Denies fevers/chills or other complaints.  Speaking full/clear sentences, rr even/un-lab.  Skin PWD.  MAEI, ambulatory with steady gait.  NAD.

## 2015-02-05 NOTE — Discharge Instructions (Signed)
Apply warm compresses to jaw throughout the day. Take antibiotic until finished and avoid direct sunlight exposure. Perform salt water swishes to help with pain. Take naprosyn and norco as directed, as needed for pain but do not drive or operate machinery with pain medication use. Followup with a dentist is very important for ongoing evaluation and management of recurrent dental pain. Call your dentist and make an appointment for ongoing evaluation. Return to emergency department for emergent changing or worsening symptoms.   Dental Pain Toothache is pain in or around a tooth. It may get worse with chewing or with cold or heat.  HOME CARE  Your dentist may use a numbing medicine during treatment. If so, you may need to avoid eating until the medicine wears off. Ask your dentist about this.  Only take medicine as told by your dentist or doctor.  Avoid chewing food near the painful tooth until after all treatment is done. Ask your dentist about this. GET HELP RIGHT AWAY IF:   The problem gets worse or new problems appear.  You have a fever.  There is redness and puffiness (swelling) of the face, jaw, or neck.  You cannot open your mouth.  There is pain in the jaw.  There is very bad pain that is not helped by medicine. MAKE SURE YOU:   Understand these instructions.  Will watch your condition.  Will get help right away if you are not doing well or get worse. Document Released: 12/20/2007 Document Revised: 09/25/2011 Document Reviewed: 12/20/2007 [facility] Patient Information 2015 [place], [place]. This information is not intended to replace advice given to you by your health care provider. Make sure you discuss any questions you have with your health care provider.  Dental Abscess A dental abscess is a collection of infected fluid (pus) from a bacterial infection in the inner part of the tooth (pulp). It usually occurs at the end of the tooth's root.  CAUSES   Severe tooth  decay.  Trauma to the tooth that allows bacteria to enter into the pulp, such as a broken or chipped tooth. SYMPTOMS   Severe pain in and around the infected tooth.  Swelling and redness around the abscessed tooth or in the mouth or face.  Tenderness.  Pus drainage.  Bad breath.  Bitter taste in the mouth.  Difficulty swallowing.  Difficulty opening the mouth.  Nausea.  Vomiting.  Chills.  Swollen neck glands. DIAGNOSIS   A medical and dental history will be taken.  An examination will be performed by tapping on the abscessed tooth.  X-rays may be taken of the tooth to identify the abscess. TREATMENT The goal of treatment is to eliminate the infection. You may be prescribed antibiotic medicine to stop the infection from spreading. A root canal may be performed to save the tooth. If the tooth cannot be saved, it may be pulled (extracted) and the abscess may be drained.  HOME CARE INSTRUCTIONS  Only take over-the-counter or prescription medicines for pain, fever, or discomfort as directed by your caregiver.  Rinse your mouth (gargle) often with salt water ( tsp salt in 8 oz [250 ml] of warm water) to relieve pain or swelling.  Do not drive after taking pain medicine (narcotics).  Do not apply heat to the outside of your face.  Return to your dentist for further treatment as directed. SEEK MEDICAL CARE IF:  Your pain is not helped by medicine.  Your pain is getting worse instead of better. SEEK IMMEDIATE MEDICAL  CARE IF:  You have a fever or persistent symptoms for more than 2-3 days.  You have a fever and your symptoms suddenly get worse.  You have chills or a very bad headache.  You have problems breathing or swallowing.  You have trouble opening your mouth.  You have swelling in the neck or around the eye. Document Released: 07/03/2005 Document Revised: 03/27/2012 Document Reviewed: 10/11/2010 [facility] Patient Information 2015 [place], [place]. This  information is not intended to replace advice given to you by your health care provider. Make sure you discuss any questions you have with your health care provider.   Emergency Department Resource Guide 1) Find a Doctor and Pay Out of Pocket Although you won't have to find out who is covered by your insurance plan, it is a good idea to ask around and get recommendations. You will then need to call the office and see if the doctor you have chosen will accept you as a new patient and what types of options they offer for patients who are self-pay. Some doctors offer discounts or will set up payment plans for their patients who do not have insurance, but you will need to ask so you aren't surprised when you get to your appointment.  2) Contact Your Local Health Department Not all health departments have doctors that can see patients for sick visits, but many do, so it is worth a call to see if yours does. If you don't know where your local health department is, you can check in your phone book. The CDC also has a tool to help you locate your state's health department, and many state websites also have listings of all of their local health departments.  3) Find a Walk-in Clinic If your illness is not likely to be very severe or complicated, you may want to try a walk in clinic. These are popping up all over the country in pharmacies, drugstores, and shopping centers. They're usually staffed by nurse practitioners or physician assistants that have been trained to treat common illnesses and complaints. They're usually fairly quick and inexpensive. However, if you have serious medical issues or chronic medical problems, these are probably not your best option.  No Primary Care Doctor: - Call [facility] at  [phone] - they can help you locate a primary care doctor that  accepts your insurance, provides certain services, etc. - Physician Referral Service- [phone]  Chronic Pain  Problems: Organization         Address  Phone   Notes  [facility]  [phone] Patients need to be referred by their primary care doctor.   Medication Assistance: Organization         Address  Phone   Notes  [facility] Medication [facility] [address] [place] [place], [place] [address] [phone] --Must be a resident of [facility] -- Must have NO insurance coverage whatsoever (no Medicaid/ Medicare, etc.) -- The pt. MUST have a primary care doctor that directs their care regularly and follows them in the community   [facility]  [phone]   [place]  [phone]     Dental Care: Organization         Address  Phone  Notes  [facility] Department of [facility] [address] [place], [place] [phone] Accepts children up to age 74 who are enrolled in [place] or [place] Health Choice; pregnant women with a Medicaid card; and children who  have applied for Medicaid or [place] Health Choice, but were declined, whose parents can pay a reduced fee at time of service.  [facility] Department of [facility]  [address], [place] [phone] Accepts children up to age 86 who are enrolled in [place] or [place] Health Choice; pregnant women with a Medicaid card; and children who have applied for Medicaid or [place] Health Choice, but were declined, whose parents can pay a reduced fee at time of service.  [facility]  [address] [place], [place] [phone] Patients are seen by appointment only. Walk-ins are not accepted. [facility] will see patients 23 years of age and older. Monday - Tuesday (8am-5pm) Most Wednesdays (8:30-5pm) $30 per visit, cash only  [facility]  [address], [facility] [phone] Patients are seen by appointment only. Walk-ins are not accepted. [facility] will see patients 60 years of age and  older. One Wednesday Evening (Monthly: Volunteer Based).  $30 per visit, cash only  [employer]  [phone] for adults; Children under age 18, call Graduate Pediatric Dentistry at [phone]. Children aged 69-14, please call [phone] to request a pediatric application.  Dental services are provided in all areas of dental care including fillings, crowns and bridges, complete and partial dentures, implants, gum treatment, root canals, and extractions. Preventive care is also provided. Treatment is provided to both adults and children. Patients are selected via a lottery and there is often a waiting list.   [facility] [address] [place]  [phone] [email or web address]   [facility] [address] [place], [place] [phone], Ext. [phone] Second and Fourth Thursday of each month, opens at 6:30 AM; Clinic ends at 9 AM.  Patients are seen on a first-come first-served basis, and a limited number are seen during each clinic.   [facility]  [address] [place], [place] [phone]   Eligibility Requirements You must have lived in [place], [place], or [place] counties for at least the last three months.   You cannot be eligible for state or federal sponsored [place], including [employer], [place], or [employer].   You generally cannot be eligible for healthcare insurance through your employer.    How to apply: Eligibility screenings are held every Tuesday and Wednesday afternoon from 1:00 pm until 4:00 pm. You do not need an appointment for the interview!  [facility] [address], [place], [place] [phone]   [facility] Health Department  [phone]   [facility] Health Department  [phone]   [facility] Health Department  [phone]

## 2015-12-10 ENCOUNTER — Telehealth: Payer: Self-pay | Admitting: Behavioral Health

## 2015-12-10 NOTE — Telephone Encounter (Signed)
Unable to reach patient at time of Pre-Visit Call.  Left message for patient to return call when available.    

## 2015-12-14 ENCOUNTER — Encounter: Payer: Self-pay | Admitting: Family Medicine

## 2016-02-22 ENCOUNTER — Encounter: Payer: Self-pay | Admitting: Family Medicine

## 2016-02-22 ENCOUNTER — Ambulatory Visit (INDEPENDENT_AMBULATORY_CARE_PROVIDER_SITE_OTHER): Payer: Self-pay | Admitting: Family Medicine

## 2016-02-22 VITALS — BP 122/84 | HR 69 | Temp 98.3°F | Resp 17 | Ht 67.0 in | Wt 296.0 lb

## 2016-02-22 DIAGNOSIS — Z0000 Encounter for general adult medical examination without abnormal findings: Secondary | ICD-10-CM

## 2016-02-22 DIAGNOSIS — M79605 Pain in left leg: Secondary | ICD-10-CM

## 2016-02-22 LAB — CBC WITH DIFFERENTIAL/PLATELET
BASOS ABS: 0 10*3/uL (ref 0.0–0.1)
Basophils Relative: 0.6 % (ref 0.0–3.0)
EOS PCT: 1.7 % (ref 0.0–5.0)
Eosinophils Absolute: 0.1 10*3/uL (ref 0.0–0.7)
HEMATOCRIT: 38.6 % (ref 36.0–46.0)
HEMOGLOBIN: 12.8 g/dL (ref 12.0–15.0)
Lymphocytes Relative: 51.2 % — ABNORMAL HIGH (ref 12.0–46.0)
Lymphs Abs: 3.3 10*3/uL (ref 0.7–4.0)
MCHC: 33.2 g/dL (ref 30.0–36.0)
MCV: 76.2 fl — AB (ref 78.0–100.0)
MONOS PCT: 5 % (ref 3.0–12.0)
Monocytes Absolute: 0.3 10*3/uL (ref 0.1–1.0)
Neutro Abs: 2.6 10*3/uL (ref 1.4–7.7)
Neutrophils Relative %: 41.5 % — ABNORMAL LOW (ref 43.0–77.0)
Platelets: 171 10*3/uL (ref 150.0–400.0)
RBC: 5.06 Mil/uL (ref 3.87–5.11)
RDW: 15.1 % (ref 11.5–15.5)
WBC: 6.4 10*3/uL (ref 4.0–10.5)

## 2016-02-22 LAB — LIPID PANEL
CHOL/HDL RATIO: 2
Cholesterol: 138 mg/dL (ref 0–200)
HDL: 63.6 mg/dL (ref >39.00)
LDL Cholesterol: 64 mg/dL (ref 0–99)
NONHDL: 74.13
Triglycerides: 52 mg/dL (ref 0.0–149.0)
VLDL: 10.4 mg/dL (ref 0.0–40.0)

## 2016-02-22 LAB — COMPREHENSIVE METABOLIC PANEL
ALT: 11 U/L (ref 0–35)
AST: 17 U/L (ref 0–37)
Albumin: 3.9 g/dL (ref 3.5–5.2)
Alkaline Phosphatase: 50 U/L (ref 39–117)
BILIRUBIN TOTAL: 0.4 mg/dL (ref 0.2–1.2)
BUN: 13 mg/dL (ref 6–23)
CO2: 24 meq/L (ref 19–32)
CREATININE: 0.72 mg/dL (ref 0.40–1.20)
Calcium: 9 mg/dL (ref 8.4–10.5)
Chloride: 107 mEq/L (ref 96–112)
GFR: 120.45 mL/min (ref >60.00)
GLUCOSE: 90 mg/dL (ref 70–99)
Potassium: 4.2 mEq/L (ref 3.5–5.1)
Sodium: 137 mEq/L (ref 135–145)
TOTAL PROTEIN: 7 g/dL (ref 6.0–8.3)

## 2016-02-22 LAB — TSH: TSH: 1.89 u[IU]/mL (ref 0.35–4.50)

## 2016-02-22 NOTE — Patient Instructions (Signed)
Preventive Care for Adults, Female A healthy lifestyle and preventive care can promote health and wellness. Preventive health guidelines for women include the following key practices.  A routine yearly physical is a good way to check with your health care provider about your health and preventive screening. It is a chance to share any concerns and updates on your health and to receive a thorough exam.  Visit your dentist for a routine exam and preventive care every 6 months. Brush your teeth twice a day and floss once a day. Good oral hygiene prevents tooth decay and gum disease.  The frequency of eye exams is based on your age, health, family medical history, use of contact lenses, and other factors. Follow your health care provider's recommendations for frequency of eye exams.  Eat a healthy diet. Foods like vegetables, fruits, whole grains, low-fat dairy products, and lean protein foods contain the nutrients you need without too many calories. Decrease your intake of foods high in solid fats, added sugars, and salt. Eat the right amount of calories for you.Get information about a proper diet from your health care provider, if necessary.  Regular physical exercise is one of the most important things you can do for your health. Most adults should get at least 150 minutes of moderate-intensity exercise (any activity that increases your heart rate and causes you to sweat) each week. In addition, most adults need muscle-strengthening exercises on 2 or more days a week.  Maintain a healthy weight. The body mass index (BMI) is a screening tool to identify possible weight problems. It provides an estimate of body fat based on height and weight. Your health care provider can find your BMI and can help you achieve or maintain a healthy weight.For adults 20 years and older:  A BMI below 18.5 is considered underweight.  A BMI of 18.5 to 24.9 is normal.  A BMI of 25 to 29.9 is considered overweight.  A  BMI of 30 and above is considered obese.  Maintain normal blood lipids and cholesterol levels by exercising and minimizing your intake of saturated fat. Eat a balanced diet with plenty of fruit and vegetables. Blood tests for lipids and cholesterol should begin at age 45 and be repeated every 5 years. If your lipid or cholesterol levels are high, you are over 50, or you are at high risk for heart disease, you may need your cholesterol levels checked more frequently.Ongoing high lipid and cholesterol levels should be treated with medicines if diet and exercise are not working.  If you smoke, find out from your health care provider how to quit. If you do not use tobacco, do not start.  Lung cancer screening is recommended for adults aged 45-80 years who are at high risk for developing lung cancer because of a history of smoking. A yearly low-dose CT scan of the lungs is recommended for people who have at least a 30-pack-year history of smoking and are a current smoker or have quit within the past 15 years. A pack year of smoking is smoking an average of 1 pack of cigarettes a day for 1 year (for example: 1 pack a day for 30 years or 2 packs a day for 15 years). Yearly screening should continue until the smoker has stopped smoking for at least 15 years. Yearly screening should be stopped for people who develop a health problem that would prevent them from having lung cancer treatment.  If you are pregnant, do not drink alcohol. If you are  breastfeeding, be very cautious about drinking alcohol. If you are not pregnant and choose to drink alcohol, do not have more than 1 drink per day. One drink is considered to be 12 ounces (355 mL) of beer, 5 ounces (148 mL) of wine, or 1.5 ounces (44 mL) of liquor.  Avoid use of street drugs. Do not share needles with anyone. Ask for help if you need support or instructions about stopping the use of drugs.  High blood pressure causes heart disease and increases the risk  of stroke. Your blood pressure should be checked at least every 1 to 2 years. Ongoing high blood pressure should be treated with medicines if weight loss and exercise do not work.  If you are 55-79 years old, ask your health care provider if you should take aspirin to prevent strokes.  Diabetes screening is done by taking a blood sample to check your blood glucose level after you have not eaten for a certain period of time (fasting). If you are not overweight and you do not have risk factors for diabetes, you should be screened once every 3 years starting at age 45. If you are overweight or obese and you are 40-70 years of age, you should be screened for diabetes every year as part of your cardiovascular risk assessment.  Breast cancer screening is essential preventive care for women. You should practice "breast self-awareness." This means understanding the normal appearance and feel of your breasts and may include breast self-examination. Any changes detected, no matter how small, should be reported to a health care provider. Women in their 20s and 30s should have a clinical breast exam (CBE) by a health care provider as part of a regular health exam every 1 to 3 years. After age 40, women should have a CBE every year. Starting at age 40, women should consider having a mammogram (breast X-ray test) every year. Women who have a family history of breast cancer should talk to their health care provider about genetic screening. Women at a high risk of breast cancer should talk to their health care providers about having an MRI and a mammogram every year.  Breast cancer gene (BRCA)-related cancer risk assessment is recommended for women who have family members with BRCA-related cancers. BRCA-related cancers include breast, ovarian, tubal, and peritoneal cancers. Having family members with these cancers may be associated with an increased risk for harmful changes (mutations) in the breast cancer genes BRCA1 and  BRCA2. Results of the assessment will determine the need for genetic counseling and BRCA1 and BRCA2 testing.  Your health care provider may recommend that you be screened regularly for cancer of the pelvic organs (ovaries, uterus, and vagina). This screening involves a pelvic examination, including checking for microscopic changes to the surface of your cervix (Pap test). You may be encouraged to have this screening done every 3 years, beginning at age 21.  For women ages 30-65, health care providers may recommend pelvic exams and Pap testing every 3 years, or they may recommend the Pap and pelvic exam, combined with testing for human papilloma virus (HPV), every 5 years. Some types of HPV increase your risk of cervical cancer. Testing for HPV may also be done on women of any age with unclear Pap test results.  Other health care providers may not recommend any screening for nonpregnant women who are considered low risk for pelvic cancer and who do not have symptoms. Ask your health care provider if a screening pelvic exam is right for   you.  If you have had past treatment for cervical cancer or a condition that could lead to cancer, you need Pap tests and screening for cancer for at least 20 years after your treatment. If Pap tests have been discontinued, your risk factors (such as having a new sexual partner) need to be reassessed to determine if screening should resume. Some women have medical problems that increase the chance of getting cervical cancer. In these cases, your health care provider may recommend more frequent screening and Pap tests.  Colorectal cancer can be detected and often prevented. Most routine colorectal cancer screening begins at the age of 50 years and continues through age 75 years. However, your health care provider may recommend screening at an earlier age if you have risk factors for colon cancer. On a yearly basis, your health care provider may provide home test kits to check  for hidden blood in the stool. Use of a small camera at the end of a tube, to directly examine the colon (sigmoidoscopy or colonoscopy), can detect the earliest forms of colorectal cancer. Talk to your health care provider about this at age 50, when routine screening begins. Direct exam of the colon should be repeated every 5-10 years through age 75 years, unless early forms of precancerous polyps or small growths are found.  People who are at an increased risk for hepatitis B should be screened for this virus. You are considered at high risk for hepatitis B if:  You were born in a country where hepatitis B occurs often. Talk with your health care provider about which countries are considered high risk.  Your parents were born in a high-risk country and you have not received a shot to protect against hepatitis B (hepatitis B vaccine).  You have HIV or AIDS.  You use needles to inject street drugs.  You live with, or have sex with, someone who has hepatitis B.  You get hemodialysis treatment.  You take certain medicines for conditions like cancer, organ transplantation, and autoimmune conditions.  Hepatitis C blood testing is recommended for all people born from 1945 through 1965 and any individual with known risks for hepatitis C.  Practice safe sex. Use condoms and avoid high-risk sexual practices to reduce the spread of sexually transmitted infections (STIs). STIs include gonorrhea, chlamydia, syphilis, trichomonas, herpes, HPV, and human immunodeficiency virus (HIV). Herpes, HIV, and HPV are viral illnesses that have no cure. They can result in disability, cancer, and death.  You should be screened for sexually transmitted illnesses (STIs) including gonorrhea and chlamydia if:  You are sexually active and are younger than 24 years.  You are older than 24 years and your health care provider tells you that you are at risk for this type of infection.  Your sexual activity has changed  since you were last screened and you are at an increased risk for chlamydia or gonorrhea. Ask your health care provider if you are at risk.  If you are at risk of being infected with HIV, it is recommended that you take a prescription medicine daily to prevent HIV infection. This is called preexposure prophylaxis (PrEP). You are considered at risk if:  You are sexually active and do not regularly use condoms or know the HIV status of your partner(s).  You take drugs by injection.  You are sexually active with a partner who has HIV.  Talk with your health care provider about whether you are at high risk of being infected with HIV. If   you choose to begin PrEP, you should first be tested for HIV. You should then be tested every 3 months for as long as you are taking PrEP.  Osteoporosis is a disease in which the bones lose minerals and strength with aging. This can result in serious bone fractures or breaks. The risk of osteoporosis can be identified using a bone density scan. Women ages 67 years and over and women at risk for fractures or osteoporosis should discuss screening with their health care providers. Ask your health care provider whether you should take a calcium supplement or vitamin D to reduce the rate of osteoporosis.  Menopause can be associated with physical symptoms and risks. Hormone replacement therapy is available to decrease symptoms and risks. You should talk to your health care provider about whether hormone replacement therapy is right for you.  Use sunscreen. Apply sunscreen liberally and repeatedly throughout the day. You should seek shade when your shadow is shorter than you. Protect yourself by wearing long sleeves, pants, a wide-brimmed hat, and sunglasses year round, whenever you are outdoors.  Once a month, do a whole body skin exam, using a mirror to look at the skin on your back. Tell your health care provider of new moles, moles that have irregular borders, moles that  are larger than a pencil eraser, or moles that have changed in shape or color.  Stay current with required vaccines (immunizations).  Influenza vaccine. All adults should be immunized every year.  Tetanus, diphtheria, and acellular pertussis (Td, Tdap) vaccine. Pregnant women should receive 1 dose of Tdap vaccine during each pregnancy. The dose should be obtained regardless of the length of time since the last dose. Immunization is preferred during the 27th-36th week of gestation. An adult who has not previously received Tdap or who does not know her vaccine status should receive 1 dose of Tdap. This initial dose should be followed by tetanus and diphtheria toxoids (Td) booster doses every 10 years. Adults with an unknown or incomplete history of completing a 3-dose immunization series with Td-containing vaccines should begin or complete a primary immunization series including a Tdap dose. Adults should receive a Td booster every 10 years.  Varicella vaccine. An adult without evidence of immunity to varicella should receive 2 doses or a second dose if she has previously received 1 dose. Pregnant females who do not have evidence of immunity should receive the first dose after pregnancy. This first dose should be obtained before leaving the health care facility. The second dose should be obtained 4-8 weeks after the first dose.  Human papillomavirus (HPV) vaccine. Females aged 13-26 years who have not received the vaccine previously should obtain the 3-dose series. The vaccine is not recommended for use in pregnant females. However, pregnancy testing is not needed before receiving a dose. If a female is found to be pregnant after receiving a dose, no treatment is needed. In that case, the remaining doses should be delayed until after the pregnancy. Immunization is recommended for any person with an immunocompromised condition through the age of 61 years if she did not get any or all doses earlier. During the  3-dose series, the second dose should be obtained 4-8 weeks after the first dose. The third dose should be obtained 24 weeks after the first dose and 16 weeks after the second dose.  Zoster vaccine. One dose is recommended for adults aged 30 years or older unless certain conditions are present.  Measles, mumps, and rubella (MMR) vaccine. Adults born  before 1957 generally are considered immune to measles and mumps. Adults born in 1957 or later should have 1 or more doses of MMR vaccine unless there is a contraindication to the vaccine or there is laboratory evidence of immunity to each of the three diseases. A routine second dose of MMR vaccine should be obtained at least 28 days after the first dose for students attending postsecondary schools, health care workers, or international travelers. People who received inactivated measles vaccine or an unknown type of measles vaccine during 1963-1967 should receive 2 doses of MMR vaccine. People who received inactivated mumps vaccine or an unknown type of mumps vaccine before 1979 and are at high risk for mumps infection should consider immunization with 2 doses of MMR vaccine. For females of childbearing age, rubella immunity should be determined. If there is no evidence of immunity, females who are not pregnant should be vaccinated. If there is no evidence of immunity, females who are pregnant should delay immunization until after pregnancy. Unvaccinated health care workers born before 1957 who lack laboratory evidence of measles, mumps, or rubella immunity or laboratory confirmation of disease should consider measles and mumps immunization with 2 doses of MMR vaccine or rubella immunization with 1 dose of MMR vaccine.  Pneumococcal 13-valent conjugate (PCV13) vaccine. When indicated, a person who is uncertain of his immunization history and has no record of immunization should receive the PCV13 vaccine. All adults 65 years of age and older should receive this  vaccine. An adult aged 19 years or older who has certain medical conditions and has not been previously immunized should receive 1 dose of PCV13 vaccine. This PCV13 should be followed with a dose of pneumococcal polysaccharide (PPSV23) vaccine. Adults who are at high risk for pneumococcal disease should obtain the PPSV23 vaccine at least 8 weeks after the dose of PCV13 vaccine. Adults older than 32 years of age who have normal immune system function should obtain the PPSV23 vaccine dose at least 1 year after the dose of PCV13 vaccine.  Pneumococcal polysaccharide (PPSV23) vaccine. When PCV13 is also indicated, PCV13 should be obtained first. All adults aged 65 years and older should be immunized. An adult younger than age 65 years who has certain medical conditions should be immunized. Any person who resides in a nursing home or long-term care facility should be immunized. An adult smoker should be immunized. People with an immunocompromised condition and certain other conditions should receive both PCV13 and PPSV23 vaccines. People with human immunodeficiency virus (HIV) infection should be immunized as soon as possible after diagnosis. Immunization during chemotherapy or radiation therapy should be avoided. Routine use of PPSV23 vaccine is not recommended for American Indians, [place] Natives, or people younger than 65 years unless there are medical conditions that require PPSV23 vaccine. When indicated, people who have unknown immunization and have no record of immunization should receive PPSV23 vaccine. One-time revaccination 5 years after the first dose of PPSV23 is recommended for people aged 19-64 years who have chronic kidney failure, nephrotic syndrome, asplenia, or immunocompromised conditions. People who received 1-2 doses of PPSV23 before age 65 years should receive another dose of PPSV23 vaccine at age 65 years or later if at least 5 years have passed since the previous dose. Doses of PPSV23 are not  needed for people immunized with PPSV23 at or after age 65 years.  Meningococcal vaccine. Adults with asplenia or persistent complement component deficiencies should receive 2 doses of quadrivalent meningococcal conjugate (MenACWY-D) vaccine. The doses should be obtained   at least 2 months apart. Microbiologists working with certain meningococcal bacteria, [place] recruits, people at risk during an outbreak, and people who travel to or live in countries with a high rate of meningitis should be immunized. A first-year college student up through age 34 years who is living in a residence hall should receive a dose if she did not receive a dose on or after her 16th birthday. Adults who have certain high-risk conditions should receive one or more doses of vaccine.  Hepatitis A vaccine. Adults who wish to be protected from this disease, have certain high-risk conditions, work with hepatitis A-infected animals, work in hepatitis A research labs, or travel to or work in countries with a high rate of hepatitis A should be immunized. Adults who were previously unvaccinated and who anticipate close contact with an international adoptee during the first 60 days after arrival in the [place] from a country with a high rate of hepatitis A should be immunized.  Hepatitis B vaccine. Adults who wish to be protected from this disease, have certain high-risk conditions, may be exposed to blood or other infectious body fluids, are household contacts or sex partners of hepatitis B positive people, are clients or workers in certain care facilities, or travel to or work in countries with a high rate of hepatitis B should be immunized.  Haemophilus influenzae type b (Hib) vaccine. A previously unvaccinated person with asplenia or sickle cell disease or having a scheduled splenectomy should receive 1 dose of Hib vaccine. Regardless of previous immunization, a recipient of a hematopoietic stem cell transplant should receive a  3-dose series 6-12 months after her successful transplant. Hib vaccine is not recommended for adults with HIV infection. Preventive Services / Frequency Ages 35 to 4 years  Blood pressure check.** / Every 3-5 years.  Lipid and cholesterol check.** / Every 5 years beginning at age 60.  Clinical breast exam.** / Every 3 years for women in their 71s and 10s.  BRCA-related cancer risk assessment.** / For women who have family members with a BRCA-related cancer (breast, ovarian, tubal, or peritoneal cancers).  Pap test.** / Every 2 years from ages 76 through 26. Every 3 years starting at age 61 through age 76 or 93 with a history of 3 consecutive normal Pap tests.  HPV screening.** / Every 3 years from ages 37 through ages 60 to 51 with a history of 3 consecutive normal Pap tests.  Hepatitis C blood test.** / For any individual with known risks for hepatitis C.  Skin self-exam. / Monthly.  Influenza vaccine. / Every year.  Tetanus, diphtheria, and acellular pertussis (Tdap, Td) vaccine.** / Consult your health care provider. Pregnant women should receive 1 dose of Tdap vaccine during each pregnancy. 1 dose of Td every 10 years.  Varicella vaccine.** / Consult your health care provider. Pregnant females who do not have evidence of immunity should receive the first dose after pregnancy.  HPV vaccine. / 3 doses over 6 months, if 93 and younger. The vaccine is not recommended for use in pregnant females. However, pregnancy testing is not needed before receiving a dose.  Measles, mumps, rubella (MMR) vaccine.** / You need at least 1 dose of MMR if you were born in 1957 or later. You may also need a 2nd dose. For females of childbearing age, rubella immunity should be determined. If there is no evidence of immunity, females who are not pregnant should be vaccinated. If there is no evidence of immunity, females who are  pregnant should delay immunization until after pregnancy.  Pneumococcal  13-valent conjugate (PCV13) vaccine.** / Consult your health care provider.  Pneumococcal polysaccharide (PPSV23) vaccine.** / 1 to 2 doses if you smoke cigarettes or if you have certain conditions.  Meningococcal vaccine.** / 1 dose if you are age 68 to 8 years and a Market researcher living in a residence hall, or have one of several medical conditions, you need to get vaccinated against meningococcal disease. You may also need additional booster doses.  Hepatitis A vaccine.** / Consult your health care provider.  Hepatitis B vaccine.** / Consult your health care provider.  Haemophilus influenzae type b (Hib) vaccine.** / Consult your health care provider. Ages 7 to 53 years  Blood pressure check.** / Every year.  Lipid and cholesterol check.** / Every 5 years beginning at age 25 years.  Lung cancer screening. / Every year if you are aged 11-80 years and have a 30-pack-year history of smoking and currently smoke or have quit within the past 15 years. Yearly screening is stopped once you have quit smoking for at least 15 years or develop a health problem that would prevent you from having lung cancer treatment.  Clinical breast exam.** / Every year after age 48 years.  BRCA-related cancer risk assessment.** / For women who have family members with a BRCA-related cancer (breast, ovarian, tubal, or peritoneal cancers).  Mammogram.** / Every year beginning at age 41 years and continuing for as long as you are in good health. Consult with your health care provider.  Pap test.** / Every 3 years starting at age 65 years through age 37 or 70 years with a history of 3 consecutive normal Pap tests.  HPV screening.** / Every 3 years from ages 72 years through ages 60 to 40 years with a history of 3 consecutive normal Pap tests.  Fecal occult blood test (FOBT) of stool. / Every year beginning at age 21 years and continuing until age 5 years. You may not need to do this test if you get  a colonoscopy every 10 years.  Flexible sigmoidoscopy or colonoscopy.** / Every 5 years for a flexible sigmoidoscopy or every 10 years for a colonoscopy beginning at age 35 years and continuing until age 48 years.  Hepatitis C blood test.** / For all people born from [birth year] through 1965 and any individual with known risks for hepatitis C.  Skin self-exam. / Monthly.  Influenza vaccine. / Every year.  Tetanus, diphtheria, and acellular pertussis (Tdap/Td) vaccine.** / Consult your health care provider. Pregnant women should receive 1 dose of Tdap vaccine during each pregnancy. 1 dose of Td every 10 years.  Varicella vaccine.** / Consult your health care provider. Pregnant females who do not have evidence of immunity should receive the first dose after pregnancy.  Zoster vaccine.** / 1 dose for adults aged 30 years or older.  Measles, mumps, rubella (MMR) vaccine.** / You need at least 1 dose of MMR if you were born in 1957 or later. You may also need a second dose. For females of childbearing age, rubella immunity should be determined. If there is no evidence of immunity, females who are not pregnant should be vaccinated. If there is no evidence of immunity, females who are pregnant should delay immunization until after pregnancy.  Pneumococcal 13-valent conjugate (PCV13) vaccine.** / Consult your health care provider.  Pneumococcal polysaccharide (PPSV23) vaccine.** / 1 to 2 doses if you smoke cigarettes or if you have certain conditions.  Meningococcal vaccine.** /  Consult your health care provider.  Hepatitis A vaccine.** / Consult your health care provider.  Hepatitis B vaccine.** / Consult your health care provider.  Haemophilus influenzae type b (Hib) vaccine.** / Consult your health care provider. Ages 64 years and over  Blood pressure check.** / Every year.  Lipid and cholesterol check.** / Every 5 years beginning at age 23 years.  Lung cancer screening. / Every year if you  are aged 16-80 years and have a 30-pack-year history of smoking and currently smoke or have quit within the past 15 years. Yearly screening is stopped once you have quit smoking for at least 15 years or develop a health problem that would prevent you from having lung cancer treatment.  Clinical breast exam.** / Every year after age 74 years.  BRCA-related cancer risk assessment.** / For women who have family members with a BRCA-related cancer (breast, ovarian, tubal, or peritoneal cancers).  Mammogram.** / Every year beginning at age 44 years and continuing for as long as you are in good health. Consult with your health care provider.  Pap test.** / Every 3 years starting at age 58 years through age 22 or 39 years with 3 consecutive normal Pap tests. Testing can be stopped between 65 and 70 years with 3 consecutive normal Pap tests and no abnormal Pap or HPV tests in the past 10 years.  HPV screening.** / Every 3 years from ages 64 years through ages 70 or 61 years with a history of 3 consecutive normal Pap tests. Testing can be stopped between 65 and 70 years with 3 consecutive normal Pap tests and no abnormal Pap or HPV tests in the past 10 years.  Fecal occult blood test (FOBT) of stool. / Every year beginning at age 40 years and continuing until age 27 years. You may not need to do this test if you get a colonoscopy every 10 years.  Flexible sigmoidoscopy or colonoscopy.** / Every 5 years for a flexible sigmoidoscopy or every 10 years for a colonoscopy beginning at age 7 years and continuing until age 32 years.  Hepatitis C blood test.** / For all people born from [birth year] through 1967 and any individual with known risks for hepatitis C.  Osteoporosis screening.** / A one-time screening for women ages 30 years and over and women at risk for fractures or osteoporosis.  Skin self-exam. / Monthly.  Influenza vaccine. / Every year.  Tetanus, diphtheria, and acellular pertussis (Tdap/Td)  vaccine.** / 1 dose of Td every 10 years.  Varicella vaccine.** / Consult your health care provider.  Zoster vaccine.** / 1 dose for adults aged 35 years or older.  Pneumococcal 13-valent conjugate (PCV13) vaccine.** / Consult your health care provider.  Pneumococcal polysaccharide (PPSV23) vaccine.** / 1 dose for all adults aged 46 years and older.  Meningococcal vaccine.** / Consult your health care provider.  Hepatitis A vaccine.** / Consult your health care provider.  Hepatitis B vaccine.** / Consult your health care provider.  Haemophilus influenzae type b (Hib) vaccine.** / Consult your health care provider. ** Family history and personal history of risk and conditions may change your health care provider's recommendations.   This information is not intended to replace advice given to you by your health care provider. Make sure you discuss any questions you have with your health care provider.   Document Released: 08/29/2001 Document Revised: 07/24/2014 Document Reviewed: 11/28/2010 Elsevier Interactive Patient Education [employer].

## 2016-02-22 NOTE — Progress Notes (Signed)
Subjective:     Lindsey Figueroa is a 30 y.o. female and is here for a comprehensive physical exam. The patient reports problems - L thigh/ leg pain -- pulled hamstring.  Social History   Social History  . Marital status: Single    Spouse name: N/A  . Number of children: N/A  . Years of education: N/A   Occupational History  .      up town Nurse, [employer]   Social History Main Topics  . Smoking status: Never Smoker  . Smokeless tobacco: Never Used  . Alcohol use No  . Drug use: No  . Sexual activity: Not Currently    Partners: Male    Birth control/ protection: None   Other Topics Concern  . Not on file   Social History Narrative   Trainer at gym 4x a week   Health Maintenance  Topic Date Due  . INFLUENZA VACCINE  02/15/2016  . PAP SMEAR  09/22/2016  . TETANUS/TDAP  09/23/2023  . HIV Screening  Completed    The following portions of the patient's history were reviewed and updated as appropriate: She  has no past medical history on file. She  does not have any pertinent problems on file. She  has a past surgical history that includes Wisdom tooth extraction and Tonsillectomy and adenoidectomy. Her family history includes Cancer - Lung in her maternal grandfather; Diabetes in her mother; Hypertension in her maternal grandmother. She  reports that she has never smoked. She has never used smokeless tobacco. She reports that she does not drink alcohol or use drugs. She has a current medication list which includes the following prescription(s): ibuprofen. Current Outpatient Prescriptions on File Prior to Visit  Medication Sig Dispense Refill  . ibuprofen (ADVIL,MOTRIN) 200 MG tablet Take 200 mg by mouth every 6 (six) hours as needed for mild pain.     No current facility-administered medications on file prior to visit.    She has No Known Allergies..  Review of Systems Review of Systems  Constitutional: Negative for activity change, appetite change and fatigue.  HENT:  Negative for hearing loss, congestion, tinnitus and ear discharge.  dentist [id number] Eyes: Negative for visual disturbance  Respiratory: Negative for cough, chest tightness and shortness of breath.   Cardiovascular: Negative for chest pain, palpitations and leg swelling.  Gastrointestinal: Negative for abdominal pain, diarrhea, constipation and abdominal distention.  Genitourinary: Negative for urgency, frequency, decreased urine volume and difficulty urinating.  Musculoskeletal: Negative for back pain, arthralgias and gait problem.  Skin: Negative for color change, pallor and rash.  Neurological: Negative for dizziness, light-headedness, numbness and headaches.  Hematological: Negative for adenopathy. Does not bruise/bleed easily.  Psychiatric/Behavioral: Negative for suicidal ideas, confusion, sleep disturbance, self-injury, dysphoric mood, decreased concentration and agitation.       Objective:    BP 122/84 (BP Location: Left Arm, Patient Position: Sitting, Cuff Size: Large)   Pulse 69   Temp 98.3 F (36.8 C) (Oral)   Resp 17   Ht 5\' 7"  (1.702 m)   Wt 296 lb (134.3 kg)   LMP 02/01/2016 (Approximate)   SpO2 99%   BMI 46.36 kg/m  General appearance: alert, cooperative, appears stated age and no distress Head: Normocephalic, without obvious abnormality, atraumatic Eyes: conjunctivae/corneas clear. PERRL, EOM's intact. Fundi benign. Ears: normal TM's and external ear canals both ears Nose: Nares normal. Septum midline. Mucosa normal. No drainage or sinus tenderness. Throat: lips, mucosa, and tongue normal; teeth and gums normal Neck: no adenopathy,  no carotid bruit, no JVD, supple, symmetrical, trachea midline and thyroid not enlarged, symmetric, no tenderness/mass/nodules Back: symmetric, no curvature. ROM normal. No CVA tenderness. Lungs: clear to auscultation bilaterally Breasts: normal appearance, no masses or tenderness Heart: regular rate and rhythm, S1, S2 normal, no murmur,  click, rub or gallop Abdomen: soft, non-tender; bowel sounds normal; no masses,  no organomegaly Pelvic: deferred Extremities: extremities normal, atraumatic, no cyanosis or edema--- pain with sitting, standing L thigh/ hamstring  Pulses: 2+ and symmetric Skin: Skin color, texture, turgor normal. No rashes or lesions Lymph nodes: Cervical, supraclavicular, and axillary nodes normal. Neurologic: Alert and oriented X 3, normal strength and tone. Normal symmetric reflexes. Normal coordination and gait    Assessment:    Healthy female exam.    Plan:    ghm utd Check labs See After Visit Summary for Counseling Recommendations    1. Preventative health care See above - Comprehensive metabolic panel - Lipid panel - CBC with Differential/Platelet - POCT urinalysis dipstick - TSH  2. Left leg pain On going-- refer sport med - Ambulatory referral to Sports Medicine

## 2016-02-22 NOTE — Progress Notes (Signed)
Pre visit review using our clinic review tool, if applicable. No additional management support is needed unless otherwise documented below in the visit note. 

## 2016-03-01 ENCOUNTER — Other Ambulatory Visit: Payer: Self-pay

## 2016-03-28 ENCOUNTER — Ambulatory Visit: Payer: Self-pay | Admitting: Family Medicine

## 2016-07-17 HISTORY — PX: WISDOM TOOTH EXTRACTION: SHX21

## 2017-04-23 ENCOUNTER — Encounter: Payer: Self-pay | Admitting: Family Medicine

## 2017-07-20 ENCOUNTER — Ambulatory Visit: Payer: Self-pay | Admitting: Family Medicine

## 2017-10-17 DIAGNOSIS — M25561 Pain in right knee: Secondary | ICD-10-CM | POA: Diagnosis not present

## 2017-10-24 ENCOUNTER — Other Ambulatory Visit (HOSPITAL_COMMUNITY): Payer: Self-pay | Admitting: General Surgery

## 2017-11-12 ENCOUNTER — Ambulatory Visit: Payer: Self-pay | Admitting: Skilled Nursing Facility1

## 2017-12-13 ENCOUNTER — Ambulatory Visit: Payer: 59 | Admitting: Psychiatry

## 2017-12-20 ENCOUNTER — Ambulatory Visit: Payer: 59 | Admitting: Psychiatry

## 2017-12-21 ENCOUNTER — Other Ambulatory Visit (HOSPITAL_COMMUNITY)
Admission: RE | Admit: 2017-12-21 | Discharge: 2017-12-21 | Disposition: A | Discharge status: Home / Self Care | Payer: 59 | Source: Ambulatory Visit | Attending: Family Medicine | Admitting: Family Medicine

## 2017-12-21 ENCOUNTER — Ambulatory Visit (INDEPENDENT_AMBULATORY_CARE_PROVIDER_SITE_OTHER): Payer: 59 | Admitting: Family Medicine

## 2017-12-21 ENCOUNTER — Encounter

## 2017-12-21 ENCOUNTER — Encounter: Payer: Self-pay | Admitting: Family Medicine

## 2017-12-21 VITALS — BP 151/90 | HR 60 | Temp 98.0°F | Resp 18 | Ht 67.0 in | Wt 325.0 lb

## 2017-12-21 DIAGNOSIS — Z01419 Encounter for gynecological examination (general) (routine) without abnormal findings: Secondary | ICD-10-CM

## 2017-12-21 DIAGNOSIS — N760 Acute vaginitis: Secondary | ICD-10-CM | POA: Diagnosis not present

## 2017-12-21 DIAGNOSIS — N898 Other specified noninflammatory disorders of vagina: Secondary | ICD-10-CM

## 2017-12-21 DIAGNOSIS — Z7251 High risk heterosexual behavior: Secondary | ICD-10-CM | POA: Insufficient documentation

## 2017-12-21 DIAGNOSIS — Z0000 Encounter for general adult medical examination without abnormal findings: Secondary | ICD-10-CM

## 2017-12-21 DIAGNOSIS — B9689 Other specified bacterial agents as the cause of diseases classified elsewhere: Secondary | ICD-10-CM | POA: Diagnosis not present

## 2017-12-21 LAB — POC URINALSYSI DIPSTICK (AUTOMATED)
BILIRUBIN UA: NEGATIVE
Blood, UA: 1
Glucose, UA: NEGATIVE
KETONES UA: NEGATIVE
Leukocytes, UA: NEGATIVE
Nitrite, UA: NEGATIVE
Protein, UA: NEGATIVE
Spec Grav, UA: 1.015 (ref 1.010–1.025)
Urobilinogen, UA: 0.2 E.U./dL
pH, UA: 7 (ref 5.0–8.0)

## 2017-12-21 MED ORDER — METRONIDAZOLE 500 MG PO TABS: 500.0000 mg | ORAL_TABLET | Freq: Two times a day (BID) | ORAL | 0 refills | Status: AC

## 2017-12-21 NOTE — Progress Notes (Signed)
Subjective:  I acted as a Neurosurgeonscribe for CMS Energy CorporationDr.Lowne-Figueroa. Lindsey Figueroa, RMA   Patient ID: Lindsey Figueroa, female    DOB: May 07, 1986, 32 y.o.   MRN: [id number]  Chief Complaint  Patient presents with  . Annual Exam    HPI  Patient is in today for annual exam.   No complaints.   Patient Care Team: Lindsey Figueroa, Grayling CongressYvonne R, DO as PCP - General   History reviewed. No pertinent past medical history.  Past Surgical History:  Procedure Laterality Date  . TONSILLECTOMY AND ADENOIDECTOMY    . WISDOM TOOTH EXTRACTION      Family History  Problem Relation Age of Onset  . Diabetes Mother   . Cancer - Lung Maternal Grandfather        Non smoker  . Hypertension Maternal Grandmother     Social History   Socioeconomic History  . Marital status: Single    Spouse name: Not on file  . Number of children: Not on file  . Years of education: Not on file  . Highest education level: Not on file  Occupational History  . Occupation: style team lead    Employer: [employer]  Social Needs  . Financial resource strain: Not on file  . Food insecurity:    Worry: Not on file    Inability: Not on file  . Transportation needs:    Medical: Not on file    Non-medical: Not on file  Tobacco Use  . Smoking status: Never Smoker  . Smokeless tobacco: Never Used  Substance and Sexual Activity  . Alcohol use: No  . Drug use: No  . Sexual activity: Not Currently    Partners: Male    Birth control/protection: None, Condom  Lifestyle  . Physical activity:    Days per week: Not on file    Minutes per session: Not on file  . Stress: Not on file  Relationships  . Social connections:    Talks on phone: Not on file    Gets together: Not on file    Attends religious service: Not on file    Active member of club or organization: Not on file    Attends meetings of clubs or organizations: Not on file    Relationship status: Not on file  . Intimate partner violence:    Fear of current or ex partner: Not on file   Emotionally abused: Not on file    Physically abused: Not on file    Forced sexual activity: Not on file  Other Topics Concern  . Not on file  Social History Narrative   Trainer at gym 4x a week    Outpatient Medications Prior to Visit  Medication Sig Dispense Refill  . ibuprofen (ADVIL,MOTRIN) 200 MG tablet Take 200 mg by mouth every 6 (six) hours as needed for mild pain.     No facility-administered medications prior to visit.     No Known Allergies  ROS     Objective:    Physical Exam  BP (!) 151/90 (BP Location: Left Arm, Patient Position: Sitting, Cuff Size: Large)   Pulse 60   Temp 98 F (36.7 C) (Oral)   Resp 18   Ht 5\' 7"  (1.702 m)   Wt (!) 325 lb (147.4 kg)   SpO2 100%   BMI 50.90 kg/m  Wt Readings from Last 3 Encounters:  12/21/17 (!) 325 lb (147.4 kg)  02/22/16 296 lb (134.3 kg)  03/26/14 (!) 302 lb 14.6 oz (137.4 kg)   BP  Readings from Last 3 Encounters:  12/21/17 (!) 151/90  02/22/16 122/84  02/05/15 140/77     Immunization History  Administered Date(s) Administered  . Influenza,inj,Quad PF,6+ Mos 09/22/2013  . Tdap 09/22/2013    Health Maintenance  Topic Date Due  . PAP SMEAR  09/22/2016  . INFLUENZA VACCINE  02/14/2018  . TETANUS/TDAP  09/23/2023  . HIV Screening  Completed    Lab Results  Component Value Date   WBC 6.4 02/22/2016   HGB 12.8 02/22/2016   HCT 38.6 02/22/2016   PLT 171.0 02/22/2016   GLUCOSE 90 02/22/2016   CHOL 138 02/22/2016   TRIG 52.0 02/22/2016   HDL 63.60 02/22/2016   LDLCALC 64 02/22/2016   ALT 11 02/22/2016   AST 17 02/22/2016   NA 137 02/22/2016   K 4.2 02/22/2016   CL 107 02/22/2016   CREATININE 0.72 02/22/2016   BUN 13 02/22/2016   CO2 24 02/22/2016   TSH 1.89 02/22/2016   HGBA1C 6.0 07/24/2011    Lab Results  Component Value Date   TSH 1.89 02/22/2016   Lab Results  Component Value Date   WBC 6.4 02/22/2016   HGB 12.8 02/22/2016   HCT 38.6 02/22/2016   MCV 76.2 (L) 02/22/2016   PLT  171.0 02/22/2016   Lab Results  Component Value Date   NA 137 02/22/2016   K 4.2 02/22/2016   CO2 24 02/22/2016   GLUCOSE 90 02/22/2016   BUN 13 02/22/2016   CREATININE 0.72 02/22/2016   BILITOT 0.4 02/22/2016   ALKPHOS 50 02/22/2016   AST 17 02/22/2016   ALT 11 02/22/2016   PROT 7.0 02/22/2016   ALBUMIN 3.9 02/22/2016   CALCIUM 9.0 02/22/2016   GFR 120.45 02/22/2016   Lab Results  Component Value Date   CHOL 138 02/22/2016   Lab Results  Component Value Date   HDL 63.60 02/22/2016   Lab Results  Component Value Date   LDLCALC 64 02/22/2016   Lab Results  Component Value Date   TRIG 52.0 02/22/2016   Lab Results  Component Value Date   CHOLHDL 2 02/22/2016   Lab Results  Component Value Date   HGBA1C 6.0 07/24/2011         Assessment & Plan:   Problem List Items Addressed This Visit      Unprioritized   Preventative health care    See avs ghm utd Check labs       Relevant Orders   CBC with Differential/Platelet   Comprehensive metabolic panel   Lipid panel   TSH    Other Visit Diagnoses    Vaginal irritation    -  Primary   Relevant Orders   POCT Urinalysis Dipstick (Automated) (Completed)   Urine Culture   Cytology - PAP   High risk heterosexual behavior       Relevant Orders   HIV antibody   RPR   Cytology - PAP   Pap smear, as part of routine gynecological examination       Relevant Orders   Cytology - PAP   Bacterial vaginitis       Relevant Medications   metroNIDAZOLE (FLAGYL) 500 MG tablet      I am having Lindsey Figueroa start on metroNIDAZOLE. I am also having her maintain her ibuprofen.  Meds ordered this encounter  Medications  . metroNIDAZOLE (FLAGYL) 500 MG tablet    Sig: Take 1 tablet (500 mg total) by mouth 2 (two) times daily for 7 days.  Dispense:  14 tablet    Refill:  0    CMA served as scribe during this visit. History, Physical and Plan performed by medical provider. Documentation and orders reviewed and  attested to.  Donato Schultz, DO

## 2017-12-21 NOTE — Assessment & Plan Note (Signed)
See avs ghm utd  Check labs 

## 2017-12-21 NOTE — Patient Instructions (Signed)
Preventive Care 18-39 Years, Female Preventive care refers to lifestyle choices and visits with your health care provider that can promote health and wellness. What does preventive care include?  A yearly physical exam. This is also called an annual well check.  Dental exams once or twice a year.  Routine eye exams. Ask your health care provider how often you should have your eyes checked.  Personal lifestyle choices, including: ? Daily care of your teeth and gums. ? Regular physical activity. ? Eating a healthy diet. ? Avoiding tobacco and drug use. ? Limiting alcohol use. ? Practicing safe sex. ? Taking vitamin and mineral supplements as recommended by your health care provider. What happens during an annual well check? The services and screenings done by your health care provider during your annual well check will depend on your age, overall health, lifestyle risk factors, and family history of disease. Counseling Your health care provider may ask you questions about your:  Alcohol use.  Tobacco use.  Drug use.  Emotional well-being.  Home and relationship well-being.  Sexual activity.  Eating habits.  Work and work Statistician.  Method of birth control.  Menstrual cycle.  Pregnancy history.  Screening You may have the following tests or measurements:  Height, weight, and BMI.  Diabetes screening. This is done by checking your blood sugar (glucose) after you have not eaten for a while (fasting).  Blood pressure.  Lipid and cholesterol levels. These may be checked every 5 years starting at age 16.  Skin check.  Hepatitis C blood test.  Hepatitis B blood test.  Sexually transmitted disease (STD) testing.  BRCA-related cancer screening. This may be done if you have a family history of breast, ovarian, tubal, or peritoneal cancers.  Pelvic exam and Pap test. This may be done every 3 years starting at age 16. Starting at age 52, this may be done every 5  years if you have a Pap test in combination with an HPV test.  Discuss your test results, treatment options, and if necessary, the need for more tests with your health care provider. Vaccines Your health care provider may recommend certain vaccines, such as:  Influenza vaccine. This is recommended every year.  Tetanus, diphtheria, and acellular pertussis (Tdap, Td) vaccine. You may need a Td booster every 10 years.  Varicella vaccine. You may need this if you have not been vaccinated.  HPV vaccine. If you are 75 or younger, you may need three doses over 6 months.  Measles, mumps, and rubella (MMR) vaccine. You may need at least one dose of MMR. You may also need a second dose.  Pneumococcal 13-valent conjugate (PCV13) vaccine. You may need this if you have certain conditions and were not previously vaccinated.  Pneumococcal polysaccharide (PPSV23) vaccine. You may need one or two doses if you smoke cigarettes or if you have certain conditions.  Meningococcal vaccine. One dose is recommended if you are age 35-21 years and a first-year college student living in a residence hall, or if you have one of several medical conditions. You may also need additional booster doses.  Hepatitis A vaccine. You may need this if you have certain conditions or if you travel or work in places where you may be exposed to hepatitis A.  Hepatitis B vaccine. You may need this if you have certain conditions or if you travel or work in places where you may be exposed to hepatitis B.  Haemophilus influenzae type b (Hib) vaccine. You may need this if  you have certain risk factors.  Talk to your health care provider about which screenings and vaccines you need and how often you need them. This information is not intended to replace advice given to you by your health care provider. Make sure you discuss any questions you have with your health care provider. Document Released: 08/29/2001 Document Revised: 03/22/2016  Document Reviewed: 05/04/2015 Elsevier Interactive Patient Education  [employer].

## 2017-12-24 LAB — COMPREHENSIVE METABOLIC PANEL
AG Ratio: 1.4 (calc) (ref 1.0–2.5)
ALBUMIN MSPROF: 3.9 g/dL (ref 3.6–5.1)
ALKALINE PHOSPHATASE (APISO): 62 U/L (ref 33–115)
ALT: 12 U/L (ref 6–29)
AST: 16 U/L (ref 10–30)
BUN: 14 mg/dL (ref 7–25)
CO2: 22 mmol/L (ref 20–32)
Calcium: 8.9 mg/dL (ref 8.6–10.2)
Chloride: 105 mmol/L (ref 98–110)
Creat: 0.82 mg/dL (ref 0.50–1.10)
Globulin: 2.7 g/dL (calc) (ref 1.9–3.7)
Glucose, Bld: 84 mg/dL (ref 65–99)
POTASSIUM: 4.2 mmol/L (ref 3.5–5.3)
Sodium: 139 mmol/L (ref 135–146)
Total Bilirubin: 0.3 mg/dL (ref 0.2–1.2)
Total Protein: 6.6 g/dL (ref 6.1–8.1)

## 2017-12-24 LAB — CBC WITH DIFFERENTIAL/PLATELET
Basophils Absolute: 37 cells/uL (ref 0–200)
Basophils Relative: 0.6 %
EOS PCT: 1.5 %
Eosinophils Absolute: 93 cells/uL (ref 15–500)
HCT: 34.8 % — ABNORMAL LOW (ref 35.0–45.0)
Hemoglobin: 11.6 g/dL — ABNORMAL LOW (ref 11.7–15.5)
Lymphs Abs: 2895 cells/uL (ref 850–3900)
MCH: 24.5 pg — ABNORMAL LOW (ref 27.0–33.0)
MCHC: 33.3 g/dL (ref 32.0–36.0)
MCV: 73.4 fL — ABNORMAL LOW (ref 80.0–100.0)
MPV: 13.1 fL — ABNORMAL HIGH (ref 7.5–12.5)
Monocytes Relative: 5.7 %
Neutro Abs: 2821 cells/uL (ref 1500–7800)
Neutrophils Relative %: 45.5 %
Platelets: 203 10*3/uL (ref 140–400)
RBC: 4.74 10*6/uL (ref 3.80–5.10)
RDW: 15 % (ref 11.0–15.0)
Total Lymphocyte: 46.7 %
WBC mixed population: 353 cells/uL (ref 200–950)
WBC: 6.2 10*3/uL (ref 3.8–10.8)

## 2017-12-24 LAB — RPR: RPR Ser Ql: NONREACTIVE

## 2017-12-24 LAB — URINE CULTURE
MICRO NUMBER:: 90687039
SPECIMEN QUALITY:: ADEQUATE

## 2017-12-24 LAB — LIPID PANEL
CHOL/HDL RATIO: 2.3 (calc) (ref <5.0)
CHOLESTEROL: 134 mg/dL (ref <200)
HDL: 59 mg/dL (ref >50)
LDL Cholesterol (Calc): 61 mg/dL (calc)
Non-HDL Cholesterol (Calc): 75 mg/dL (calc) (ref <130)
TRIGLYCERIDES: 55 mg/dL (ref <150)

## 2017-12-24 LAB — TSH: TSH: 1.8 mIU/L

## 2017-12-24 LAB — HIV ANTIBODY (ROUTINE TESTING W REFLEX): HIV: NONREACTIVE

## 2017-12-25 LAB — CYTOLOGY - PAP
Bacterial vaginitis: POSITIVE — AB
CHLAMYDIA, DNA PROBE: POSITIVE — AB
Candida vaginitis: NEGATIVE
Diagnosis: NEGATIVE
HPV (WINDOPATH): NOT DETECTED
NEISSERIA GONORRHEA: NEGATIVE
Trichomonas: NEGATIVE

## 2017-12-26 ENCOUNTER — Encounter: Payer: Self-pay | Admitting: *Deleted

## 2017-12-26 ENCOUNTER — Other Ambulatory Visit: Payer: Self-pay | Admitting: *Deleted

## 2017-12-26 MED ORDER — AZITHROMYCIN 250 MG PO TABS: ORAL_TABLET | ORAL | 0 refills | Status: DC

## 2018-01-06 ENCOUNTER — Encounter: Payer: Self-pay | Admitting: Family Medicine

## 2018-01-06 DIAGNOSIS — Z7251 High risk heterosexual behavior: Secondary | ICD-10-CM

## 2018-01-06 DIAGNOSIS — N898 Other specified noninflammatory disorders of vagina: Secondary | ICD-10-CM

## 2018-01-07 NOTE — Telephone Encounter (Signed)
Can she come in for urine ancillary?

## 2018-01-10 ENCOUNTER — Other Ambulatory Visit: Payer: 59

## 2018-01-10 ENCOUNTER — Other Ambulatory Visit (HOSPITAL_COMMUNITY)
Admission: RE | Admit: 2018-01-10 | Discharge: 2018-01-10 | Disposition: A | Discharge status: Home / Self Care | Payer: 59 | Source: Ambulatory Visit | Attending: Family Medicine | Admitting: Family Medicine

## 2018-01-10 DIAGNOSIS — N898 Other specified noninflammatory disorders of vagina: Secondary | ICD-10-CM | POA: Diagnosis present

## 2018-01-10 NOTE — Telephone Encounter (Signed)
Patient in office for testing.  Order placed.

## 2018-01-11 LAB — URINE CYTOLOGY ANCILLARY ONLY
CANDIDA VAGINITIS: NEGATIVE
Chlamydia: NEGATIVE
Neisseria Gonorrhea: NEGATIVE
Trichomonas: NEGATIVE

## 2018-01-12 ENCOUNTER — Other Ambulatory Visit: Payer: Self-pay | Admitting: Family Medicine

## 2018-01-12 DIAGNOSIS — B9689 Other specified bacterial agents as the cause of diseases classified elsewhere: Secondary | ICD-10-CM

## 2018-01-12 DIAGNOSIS — N760 Acute vaginitis: Principal | ICD-10-CM

## 2018-01-12 MED ORDER — METRONIDAZOLE 500 MG PO TABS: 500.0000 mg | ORAL_TABLET | Freq: Two times a day (BID) | ORAL | 0 refills | Status: DC

## 2018-01-22 ENCOUNTER — Ambulatory Visit: Payer: Self-pay | Admitting: Psychiatry

## 2018-01-25 ENCOUNTER — Other Ambulatory Visit: Payer: Self-pay | Admitting: Family Medicine

## 2018-01-25 ENCOUNTER — Encounter: Payer: Self-pay | Admitting: Family Medicine

## 2018-01-25 DIAGNOSIS — N760 Acute vaginitis: Principal | ICD-10-CM

## 2018-01-25 DIAGNOSIS — B9689 Other specified bacterial agents as the cause of diseases classified elsewhere: Secondary | ICD-10-CM

## 2018-01-25 MED ORDER — FLUCONAZOLE 150 MG PO TABS: 150.0000 mg | ORAL_TABLET | Freq: Once | ORAL | 0 refills | Status: AC

## 2018-01-25 MED ORDER — METRONIDAZOLE 500 MG PO TABS: 500.0000 mg | ORAL_TABLET | Freq: Two times a day (BID) | ORAL | 0 refills | Status: DC

## 2018-01-25 NOTE — Telephone Encounter (Signed)
I sent it to pharmacy

## 2018-01-25 NOTE — Telephone Encounter (Signed)
Pt states BV-like symptoms persist, monistat ineffective, requesting more flagyl or "something else". Routed to Dr. Laury AxonLowne for recommendation.

## 2018-01-28 NOTE — Telephone Encounter (Signed)
I did this last week.

## 2018-02-05 ENCOUNTER — Emergency Department (HOSPITAL_COMMUNITY): Payer: 59

## 2018-02-05 ENCOUNTER — Encounter (HOSPITAL_COMMUNITY): Payer: Self-pay | Admitting: Emergency Medicine

## 2018-02-05 ENCOUNTER — Emergency Department (HOSPITAL_COMMUNITY)
Admission: EM | Admit: 2018-02-05 | Discharge: 2018-02-05 | Disposition: A | Discharge status: Home / Self Care | Payer: 59 | Attending: Emergency Medicine | Admitting: Emergency Medicine

## 2018-02-05 DIAGNOSIS — M79675 Pain in left toe(s): Secondary | ICD-10-CM | POA: Insufficient documentation

## 2018-02-05 LAB — POC URINE PREG, ED: Preg Test, Ur: NEGATIVE

## 2018-02-05 MED ORDER — NAPROXEN 500 MG PO TABS: 500.0000 mg | ORAL_TABLET | Freq: Two times a day (BID) | ORAL | 0 refills | Status: AC

## 2018-02-05 NOTE — Discharge Instructions (Addendum)
You should take Naproxen twice daily as directed on your discharge paperwork. Make sure to take Naproxen with meals as it can cause an upset stomach. Do not take other NSAIDs while taking Naproxen such as (Aleve, Ibuprofen, Aspirin, Celebrex, etc) and do not take more than the prescribed dose as this can lead to ulcers and bleeding in your GI tract. You may use warm and cold compresses to help with your symptoms.   Please follow up with your primary doctor within the next 7-10 days for re-evaluation and further treatment of your symptoms.   Please return to the ER sooner if you have any new or worsening symptoms.

## 2018-02-05 NOTE — ED Triage Notes (Signed)
Patient here from home with complaints of left big toe pain. Denies trauma.

## 2018-02-05 NOTE — ED Provider Notes (Signed)
[facility]-EMERGENCY DEPT Provider Note   CSN: [id number] Arrival date & time: 02/05/18  1533     History   Chief Complaint Chief Complaint  Patient presents with  . Toe Pain    HPI Lindsey Figueroa is a 32 y.o. female.  HPI   Patient is a 32 year old female who presents emergency department today for evaluation of left great toe pain which began yesterday.  Patient denies any injury but states that she has pain to the left great toe when she walks and when she pushes on it.  Pain is intermittent and only present when she walks or palpates the area.  Describes pain as severe in nature when it occurs.  Has not tried any interventions for her symptoms.  No fevers.  No redness or swelling to the area.  Denies a history of gout.  Denies a history of diabetes.  History reviewed. No pertinent past medical history.  Patient Active Problem List   Diagnosis Date Noted  . Preventative health care 12/21/2017  . Morbid obesity with BMI of 50.0-59.9, adult (HCC) 09/22/2013  . Vaginal discharge 05/20/2012  . UTI symptoms 05/20/2012  . Contraception management 04/03/2012  . MORBID OBESITY 06/23/2010  . HIP PAIN, LEFT 05/20/2010  . PALPITATIONS, RECURRENT 05/20/2010    Past Surgical History:  Procedure Laterality Date  . TONSILLECTOMY AND ADENOIDECTOMY    . WISDOM TOOTH EXTRACTION       OB History   None      Home Medications    Prior to Admission medications   Medication Sig Start Date End Date Taking? Authorizing Provider  azithromycin (ZITHROMAX) 250 MG tablet Take all 4 tablets as one dose Patient not taking: Reported on 02/05/2018 12/26/17   Zola ButtonLowne Chase, Grayling CongressYvonne R, DO  metroNIDAZOLE (FLAGYL) 500 MG tablet Take 1 tablet (500 mg total) by mouth 2 (two) times daily. Patient not taking: Reported on 02/05/2018 01/25/18   Donato SchultzLowne Chase, Yvonne R, DO    Family History Family History  Problem Relation Age of Onset  . Diabetes Mother   . Cancer - Lung Maternal  Grandfather        Non smoker  . Hypertension Maternal Grandmother     Social History Social History   Tobacco Use  . Smoking status: Never Smoker  . Smokeless tobacco: Never Used  Substance Use Topics  . Alcohol use: No  . Drug use: No     Allergies   Patient has no known allergies.   Review of Systems Review of Systems  Constitutional: Negative for fever.  Musculoskeletal:       Toe pain  Skin: Negative for color change and wound.  Neurological: Negative for weakness and numbness.    Physical Exam Updated Vital Signs BP (!) 148/70 (BP Location: Right Arm)   Pulse 78   Temp (!) 97.5 F (36.4 C) (Oral)   Resp 18   Ht 5\' 6"  (1.676 m)   Wt (!) 147.4 kg (325 lb)   LMP 01/20/2018   SpO2 100%   BMI 52.46 kg/m   Physical Exam  Constitutional: She appears well-developed and well-nourished. No distress.  HENT:  Head: Normocephalic and atraumatic.  Eyes: Conjunctivae are normal.  Neck: Neck supple.  Cardiovascular: Normal rate.  Pulmonary/Chest: Effort normal.  Musculoskeletal:  TTP to the left MTP joint. No erythema or warmth. No obvious deformity or skin changes.   Neurological: She is alert.  Skin: Skin is warm and dry. Capillary refill takes less than 2 seconds.  Psychiatric: She has a normal mood and affect.  Nursing note and vitals reviewed.   ED Treatments / Results  Labs (all labs ordered are listed, but only abnormal results are displayed) Labs Reviewed  POC URINE PREG, ED    EKG None  Radiology Dg Foot Complete Left  Result Date: 02/05/2018 CLINICAL DATA:  First toe pain, no known injury, initial encounter EXAM: LEFT FOOT - COMPLETE 3+ VIEW COMPARISON:  None. FINDINGS: There is no evidence of fracture or dislocation. There is no evidence of arthropathy or other focal bone abnormality. Soft tissues are unremarkable. IMPRESSION: No acute abnormality noted. Electronically Signed   By: Alcide Clever M.D.   On: 02/05/2018 16:46     Procedures Procedures (including critical care time)  Medications Ordered in ED Medications - No data to display   Initial Impression / Assessment and Plan / ED Course  I have reviewed the triage vital signs and the nursing notes.  Pertinent labs & imaging results that were available during my care of the patient were reviewed by me and considered in my medical decision making (see chart for details).    Final Clinical Impressions(s) / ED Diagnoses   Final diagnoses:  Pain of left great toe   Pt presents with monoarticular pain to the left MTP joint.  Pt is afebrile and stable. Imaging reviewed, no evidence of occult fracture or injury. Renal function good. Pt without known peptic ulcer disease and not receiving concurrent treatment on warfarin. Pt dc with naproxen for suspected gout. Discussed that pt should respond to treatment with in 24 hour of begining treatment & likely resolve in 2-3 days. Advised pt to f/u with PCP and return to the ER if worse. All questions answered and pt understands the plan and reasons to return to the ED.    ED Discharge Orders    None       Rayne Du 02/05/18 1722    Rolan Bucco, MD 02/05/18 [phone]

## 2018-02-19 ENCOUNTER — Encounter: Payer: Self-pay | Admitting: Family Medicine

## 2018-02-19 ENCOUNTER — Other Ambulatory Visit: Payer: Self-pay | Admitting: Family Medicine

## 2018-02-19 DIAGNOSIS — B9689 Other specified bacterial agents as the cause of diseases classified elsewhere: Secondary | ICD-10-CM

## 2018-02-19 DIAGNOSIS — N760 Acute vaginitis: Principal | ICD-10-CM

## 2018-02-19 MED ORDER — METRONIDAZOLE 500 MG PO TABS: 500.0000 mg | ORAL_TABLET | Freq: Two times a day (BID) | ORAL | 0 refills | Status: DC

## 2018-02-19 NOTE — Telephone Encounter (Signed)
I sent in flagyl ---but she may need gyn referral if con't

## 2018-03-04 ENCOUNTER — Encounter: Payer: Self-pay | Admitting: Family Medicine

## 2018-03-04 ENCOUNTER — Other Ambulatory Visit: Payer: Self-pay | Admitting: Family Medicine

## 2018-03-04 DIAGNOSIS — N898 Other specified noninflammatory disorders of vagina: Secondary | ICD-10-CM

## 2018-03-04 NOTE — Telephone Encounter (Signed)
I have put one in

## 2018-03-06 ENCOUNTER — Encounter: Payer: Self-pay | Admitting: Gynecology

## 2018-03-06 ENCOUNTER — Ambulatory Visit (INDEPENDENT_AMBULATORY_CARE_PROVIDER_SITE_OTHER): Payer: 59 | Admitting: Gynecology

## 2018-03-06 VITALS — BP 130/84 | Ht 67.0 in | Wt 331.0 lb

## 2018-03-06 DIAGNOSIS — Z113 Encounter for screening for infections with a predominantly sexual mode of transmission: Secondary | ICD-10-CM | POA: Diagnosis not present

## 2018-03-06 DIAGNOSIS — N760 Acute vaginitis: Secondary | ICD-10-CM | POA: Diagnosis not present

## 2018-03-06 LAB — WET PREP FOR TRICH, YEAST, CLUE

## 2018-03-06 MED ORDER — TERCONAZOLE 0.4 % VA CREA: 1.0000 | TOPICAL_CREAM | Freq: Every day | VAGINAL | 0 refills | Status: DC

## 2018-03-06 NOTE — Progress Notes (Signed)
    Lindsey Figueroa Jun 19, 1986 [id number]        32 y.o.  G0P0000 new patient who recently underwent a full exam to include pelvic and breast exam presents with a history of recurrent bacterial vaginosis.  She saw her primary physician early June and had a positive chlamydia screen for which she was treated.  She also had a positive bacterial vaginosis screen and was treated with Flagyl 500 mg twice daily x7 days.  She notes her symptoms improved but she never felt that they totally went away.  She was retreated on several occasions with Flagyl for persisting/worsening symptoms of vaginal irritation and discharge but again never felt that it totally went away.  No vaginal odor.  No real itching.  No urinary symptoms such as frequency dysuria urgency low back pain fever or chills.  Using condoms for contraception which is acceptable to her.  Past medical history,surgical history, problem list, medications, allergies, family history and social history were all reviewed and documented in the EPIC chart.  Directed ROS with pertinent positives and negatives documented in the history of present illness/assessment and plan.  Exam: Kennon PortelaKim Gardner assistant Vitals:   03/06/18 1453  BP: 130/84  Weight: (!) 331 lb (150.1 kg)  Height: 5\' 7"  (1.702 m)   General appearance:  Normal Abdomen soft nontender without masses guarding rebound Pelvic external BUS vagina with frothy white discharge.  Cervix normal.  GC/Chlamydia screen done.  Wet prep done.  Bimanual exam without gross masses or tenderness.  Uterus difficult to palpate due to abdominal girth.  Assessment/Plan:  32 y.o. G0P0000 with:  1. Recurrent vaginitis.  Wet prep is positive for yeast.  No clue cells.  Question whether she is has had a persistent yeast and this is why the Flagyl did not completely clear her.  Recommend Terazol 7 day cream x7 nights.  We discussed whether this will totally cure her or whether she may have an issue with recurrence.   Will monitor after this single treatment if she does totally get better but then have a recurrence we will discuss possible longer-term suppressive therapy.  If she does not improve despite this treatment she will follow-up for reevaluation. 2. Positive chlamydia history, treated.  Rescreen done today. 3. Birth control discussed.  She is satisfied with condoms.  Risks of pregnancy reviewed and availability of Plan B discussed. 4. Pap smear/HPV 12/2017.  No history of significant abnormal Pap smears previously.  I spent a total of 20 face-to-face minutes with the patient, over 50% was spent counseling and coordination of care.     Dara Lordsimothy P Traevon Meiring MD, 3:25 PM 03/06/2018

## 2018-03-06 NOTE — Patient Instructions (Signed)
Use the Terazol vaginal cream nightly for 7 nights.  Call me if the vaginitis persists or recurs.

## 2018-03-07 ENCOUNTER — Encounter: Payer: Self-pay | Admitting: Gynecology

## 2018-03-07 LAB — C. TRACHOMATIS/N. GONORRHOEAE RNA
C. trachomatis RNA, TMA: NOT DETECTED
N. gonorrhoeae RNA, TMA: NOT DETECTED

## 2018-03-12 ENCOUNTER — Ambulatory Visit: Payer: 59 | Admitting: Psychiatry

## 2018-04-11 ENCOUNTER — Ambulatory Visit: Payer: 59 | Admitting: Psychiatry

## 2018-09-07 DIAGNOSIS — H00012 Hordeolum externum right lower eyelid: Secondary | ICD-10-CM | POA: Insufficient documentation

## 2018-09-08 ENCOUNTER — Emergency Department (HOSPITAL_BASED_OUTPATIENT_CLINIC_OR_DEPARTMENT_OTHER)
Admission: EM | Admit: 2018-09-08 | Discharge: 2018-09-08 | Disposition: A | Discharge status: Home / Self Care | Payer: 59 | Attending: Emergency Medicine | Admitting: Emergency Medicine

## 2018-09-08 ENCOUNTER — Other Ambulatory Visit: Payer: Self-pay

## 2018-09-08 ENCOUNTER — Encounter (HOSPITAL_BASED_OUTPATIENT_CLINIC_OR_DEPARTMENT_OTHER): Payer: Self-pay | Admitting: Emergency Medicine

## 2018-09-08 DIAGNOSIS — H00012 Hordeolum externum right lower eyelid: Secondary | ICD-10-CM

## 2018-09-08 MED ORDER — CEPHALEXIN 250 MG/5ML PO SUSR: 500.0000 mg | Freq: Three times a day (TID) | ORAL | 0 refills | Status: AC

## 2018-09-08 NOTE — ED Triage Notes (Signed)
Patient states that she has had a area of swelling to her right lower eye lid x 1 week

## 2018-09-08 NOTE — ED Provider Notes (Signed)
Emergency Department Provider Note   I have reviewed the triage vital signs and the nursing notes.   HISTORY  Chief Complaint Eyelid Problem   HPI Lindsey Figueroa is a 33 y.o. female had some swelling to her lower eyelid for the last few days approximately a week.  Patient states she tried warm compresses and green tea compresses at home without relief.  Patient states that she has no vision changes.  No fever or other associated symptoms.  No other associated rash. No other associated or modifying symptoms.    Past Medical History:  Diagnosis Date  . STD (sexually transmitted disease)    Chlamydia    Patient Active Problem List   Diagnosis Date Noted  . Preventative health care 12/21/2017  . Morbid obesity with BMI of 50.0-59.9, adult (HCC) 09/22/2013  . Vaginal discharge 05/20/2012  . UTI symptoms 05/20/2012  . Contraception management 04/03/2012  . MORBID OBESITY 06/23/2010  . HIP PAIN, LEFT 05/20/2010  . PALPITATIONS, RECURRENT 05/20/2010    Past Surgical History:  Procedure Laterality Date  . TONSILLECTOMY AND ADENOIDECTOMY    . WISDOM TOOTH EXTRACTION      Current Outpatient Rx  . Order #: [id number] Class: Print  . Order #: [id number] Class: Normal    Allergies Patient has no known allergies.  Family History  Problem Relation Age of Onset  . Diabetes Mother   . Cancer - Lung Maternal Grandfather        Non smoker  . Hypertension Maternal Grandmother     Social History Social History   Tobacco Use  . Smoking status: Never Smoker  . Smokeless tobacco: Never Used  Substance Use Topics  . Alcohol use: No  . Drug use: No    Review of Systems  All other systems negative except as documented in the HPI. All pertinent positives and negatives as reviewed in the HPI. ____________________________________________   PHYSICAL EXAM:  VITAL SIGNS: ED Triage Vitals  Enc Vitals Group     BP 09/08/18 0011 (!) 142/81     Pulse Rate 09/08/18 0011 86   Resp 09/08/18 0011 18     Temp 09/08/18 0011 98.6 F (37 C)     Temp Source 09/08/18 0011 Oral     SpO2 09/08/18 0011 100 %     Weight 09/08/18 0009 (!) 319 lb (144.7 kg)     Height 09/08/18 0009 5\' 7"  (1.702 m)    Constitutional: Alert and oriented. Well appearing and in no acute distress. Eyes: Conjunctivae are normal. PERRL. EOMI. Small stye to lower lid on right eye. Head: Atraumatic. Nose: No congestion/rhinnorhea. Mouth/Throat: Mucous membranes are moist.  Oropharynx non-erythematous. Neck: No stridor.  No meningeal signs.   Cardiovascular: Normal rate, regular rhythm. Good peripheral circulation. Grossly normal heart sounds.   Respiratory: Normal respiratory effort.  No retractions. Lungs CTAB. Gastrointestinal: Soft and nontender. No distention.  Musculoskeletal: No lower extremity tenderness nor edema. No gross deformities of extremities. Neurologic:  Normal speech and language. No gross focal neurologic deficits are appreciated.  Skin:  Skin is warm, dry and intact. No rash noted.   ____________________________________________   INITIAL IMPRESSION / ASSESSMENT AND PLAN / ED COURSE  Discussed doing an incision and drainage with her she prefers to continue warm compress with antibiotics at this time.  She will return in a few days if not improving however I suggested she follow-up with ophthalmologist.     Pertinent labs & imaging results that were available during my care of the  patient were reviewed by me and considered in my medical decision making (see chart for details).  ____________________________________________  FINAL CLINICAL IMPRESSION(S) / ED DIAGNOSES  Final diagnoses:  Hordeolum externum of right lower eyelid     MEDICATIONS GIVEN DURING THIS VISIT:  Medications - No data to display   NEW OUTPATIENT MEDICATIONS STARTED DURING THIS VISIT:  Discharge Medication List as of 09/08/2018  1:06 AM    START taking these medications   Details    cephALEXin (KEFLEX) 250 MG/5ML suspension Take 10 mLs (500 mg total) by mouth 3 (three) times daily for 7 days., Starting Sun 09/08/2018, Until Sun 09/15/2018, Print        Note:  This note was prepared with assistance of Dragon voice recognition software. Occasional wrong-word or sound-a-like substitutions may have occurred due to the inherent limitations of voice recognition software.   Georgetta Crafton, Barbara Cower, MD 09/08/18 [phone]

## 2019-02-06 ENCOUNTER — Telehealth: Payer: Self-pay | Admitting: Family

## 2019-02-06 ENCOUNTER — Other Ambulatory Visit: Payer: Self-pay

## 2019-02-06 ENCOUNTER — Ambulatory Visit (INDEPENDENT_AMBULATORY_CARE_PROVIDER_SITE_OTHER): Payer: Self-pay | Admitting: Internal Medicine

## 2019-02-06 DIAGNOSIS — Z20822 Contact with and (suspected) exposure to covid-19: Secondary | ICD-10-CM

## 2019-02-06 DIAGNOSIS — R110 Nausea: Secondary | ICD-10-CM

## 2019-02-06 DIAGNOSIS — R51 Headache: Secondary | ICD-10-CM

## 2019-02-06 DIAGNOSIS — R197 Diarrhea, unspecified: Secondary | ICD-10-CM

## 2019-02-06 DIAGNOSIS — B349 Viral infection, unspecified: Secondary | ICD-10-CM

## 2019-02-06 DIAGNOSIS — R05 Cough: Secondary | ICD-10-CM

## 2019-02-06 MED ORDER — BENZONATATE 100 MG PO CAPS: 100.0000 mg | ORAL_CAPSULE | Freq: Three times a day (TID) | ORAL | 0 refills | Status: DC | PRN

## 2019-02-06 MED ORDER — ONDANSETRON HCL 4 MG PO TABS: 4.0000 mg | ORAL_TABLET | Freq: Three times a day (TID) | ORAL | 0 refills | Status: DC | PRN

## 2019-02-06 MED ORDER — ALBUTEROL SULFATE HFA 108 (90 BASE) MCG/ACT IN AERS: 2.0000 | INHALATION_SPRAY | RESPIRATORY_TRACT | 2 refills | Status: DC | PRN

## 2019-02-06 NOTE — Progress Notes (Signed)
Subjective:    Patient ID: Lindsey Figueroa, female    DOB: 3/27/Jamie Kato1987, 33 y.o.   MRN: [id number]  DOS:  02/06/2019 Type of visit - description: Virtual Visit via Video Note  I connected with@   by a video enabled telemedicine application and verified that I am speaking with the correct person using two identifiers.   THIS ENCOUNTER IS A VIRTUAL VISIT DUE TO COVID-19 - PATIENT WAS NOT SEEN IN THE OFFICE. PATIENT HAS CONSENTED TO VIRTUAL VISIT / TELEMEDICINE VISIT   Location of patient: home  Location of provider: office  I discussed the limitations of evaluation and management by telemedicine and the availability of in person appointments. The patient expressed understanding and agreed to proceed.  History of Present Illness: Acute Symptoms started 2 days ago: Loose stools first , now frequent watery diarrhea. Some nausea but no vomiting + Dry cough Moderate to severe headache. 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     Review of Systems Denies fever, she has been checking her temperature daily.  Mild scratchy throat. No chest pain no difficulty breathing No lost of taste or sense of smell Mild abdominal crampy pain, no blood in the stools No rash No neck stiffness.   Past Medical History:  Diagnosis Date  . STD (sexually transmitted disease)    Chlamydia    Past Surgical History:  Procedure Laterality Date  . TONSILLECTOMY AND ADENOIDECTOMY    . WISDOM TOOTH EXTRACTION      Social History   Socioeconomic History  . Marital status: Single    Spouse name: Not on file  . Number of children: Not on file  . Years of education: Not on file  . Highest education level: Not on file  Occupational History  . Occupation: style team lead    Employer: [employer]  Social Needs  . Financial resource strain: Not on file  . Food insecurity    Worry: Not on file    Inability: Not on file  . Transportation needs    Medical: Not on file    Non-medical:  Not on file  Tobacco Use  . Smoking status: Never Smoker  . Smokeless tobacco: Never Used  Substance and Sexual Activity  . Alcohol use: No  . Drug use: No  . Sexual activity: Not Currently    Partners: Male    Birth control/protection: Condom    Comment: 1st intercourse 33 yo-More than 5 partners  Lifestyle  . Physical activity    Days per week: Not on file    Minutes per session: Not on file  . Stress: Not on file  Relationships  . Social Musicianconnections    Talks on phone: Not on file    Gets together: Not on file    Attends religious service: Not on file    Active member of club or organization: Not on file    Attends meetings of clubs or organizations: Not on file    Relationship status: Not on file  . Intimate partner violence    Fear of current or ex partner: Not on file    Emotionally abused: Not on file    Physically abused: Not on file    Forced sexual activity: Not on file  Other Topics Concern  . Not on file  Social History Narrative   Trainer at gym 4x a week      Allergies as of 02/06/2019   No Known Allergies  Medication List       Accurate as of February 06, 2019  1:38 PM. If you have any questions, ask your nurse or doctor.        albuterol 108 (90 Base) MCG/ACT inhaler Commonly known as: VENTOLIN HFA Inhale 2 puffs into the lungs every 4 (four) hours as needed for wheezing or shortness of breath. Started by: Benjamine Mola, FNP   benzonatate 100 MG capsule Commonly known as: Best boy Take 1-2 capsules (100-200 mg total) by mouth every 8 (eight) hours as needed for cough. Started by: Benjamine Mola, FNP   ondansetron 4 MG tablet Commonly known as: Zofran Take 1 tablet (4 mg total) by mouth every 8 (eight) hours as needed for nausea or vomiting. Started by: Benjamine Mola, FNP   terconazole 0.4 % vaginal cream Commonly known as: Terazol 7 Place 1 applicator vaginally at bedtime. For 7 nights           Objective:   Physical Exam  There were no vitals taken for this visit. This is a virtual video visit, she is alert oriented x3, in no distress, able to move her neck without problems.    Assessment    33 year old female, + obesity, no history of asthma, not on birth control pills, LMP 2 weeks ago.  Presents with  Viral syndrome: Symptoms started 2 days ago, likely viral syndrome, possibly COVID-19. We will send her to be tested for COVID. We agreed on supportive treatment with fluids, Tylenol, Imodium, Robitussin-DM and stay away from work. The patient took notes of all the recommendations and verbalized understanding. Indications to go to the ER (severe headache, rash, blood in the stools, feeling worse, chest pain, shortness of breath) discussed with the patient, verbalized understanding. She needs to consider herself contagious for 3 days if testing is negative for 2 weeks if it is positive.

## 2019-02-06 NOTE — Progress Notes (Signed)
Greater than 5 minutes, yet less than 10 minutes of time have been spent researching, coordinating, and implementing care for this patient today.  Thank you for the details you included in the comment boxes. Those details are very helpful in determining the best course of treatment for you and help us to provide the best care.  Your symptoms are much more consistent with Covid-19. See below.  E-Visit for Corona Virus Screening   Your current symptoms could be consistent with the coronavirus.  Many health care providers can now test patients at their office but not all are.  [place] has multiple testing sites. For information on our COVID testing locations and hours go to [email or web address]  Please quarantine yourself while awaiting your test results.  We are enrolling you in our MyChart Home Montioring for COVID19 . Daily you will receive a questionnaire within the MyChart website. Our COVID 19 response team willl be monitoriing your responses daily.    COVID-19 is a respiratory illness with symptoms that are similar to the flu. Symptoms are typically mild to moderate, but there have been cases of severe illness and death due to the virus. The following symptoms may appear 2-14 days after exposure: . Fever . Cough . Shortness of breath or difficulty breathing . Chills . Repeated shaking with chills . Muscle pain . Headache . Sore throat . New loss of taste or smell . Fatigue . Congestion or runny nose . Nausea or vomiting . Diarrhea  It is vitally important that if you feel that you have an infection such as this virus or any other virus that you stay home and away from places where you may spread it to others.  You should self-quarantine for 14 days if you have symptoms that could potentially be coronavirus or have been in close contact a with a person diagnosed with COVID-19 within the last 2 weeks. You should avoid contact with people age 35 and older.    You should wear a mask or cloth face covering over your nose and mouth if you must be around other people or animals, including pets (even at home). Try to stay at least 6 feet away from other people. This will protect the people around you.  You can use medication such as A prescription cough medication called Tessalon Perles 100 mg. You may take 1-2 capsules every 8 hours as needed for cough and A prescription inhaler called Albuterol MDI 90 mcg /actuation 2 puffs every 4 hours as needed for shortness of breath, wheezing, cough   I have also sent Zofran 4mg  by mouth every 8 hours as needed for nausea or stomach upset.   You may also take acetaminophen (Tylenol) as needed for fever.   Reduce your risk of any infection by using the same precautions used for avoiding the common cold or flu:  Marland Kitchen. Wash your hands often with soap and warm water for at least 20 seconds.  If soap and water are not readily available, use an alcohol-based hand sanitizer with at least 60% alcohol.  . If coughing or sneezing, cover your mouth and nose by coughing or sneezing into the elbow areas of your shirt or coat, into a tissue or into your sleeve (not your hands). . Avoid shaking hands with others and consider head nods or verbal greetings only. . Avoid touching your eyes, nose, or mouth with unwashed hands.  . Avoid close contact with people who are sick. . Avoid places or events with large numbers  of people in one location, like concerts or sporting events. . Carefully consider travel plans you have or are making. . If you are planning any travel outside or inside the [place], visit the CDC's Travelers' Health webpage for the latest health notices. . If you have some symptoms but not all symptoms, continue to monitor at home and seek medical attention if your symptoms worsen. . If you are having a medical emergency, call 911.  HOME CARE . Only take medications as instructed by your medical team. . Drink plenty of  fluids and get plenty of rest. . A steam or ultrasonic humidifier can help if you have congestion.   GET HELP RIGHT AWAY IF YOU HAVE EMERGENCY WARNING SIGNS** FOR COVID-19. If you or someone is showing any of these signs seek emergency medical care immediately. Call 911 or proceed to your closest emergency facility if: . You develop worsening high fever. . Trouble breathing . Bluish lips or face . Persistent pain or pressure in the chest . New confusion . Inability to wake or stay awake . You cough up blood. . Your symptoms become more severe  **This list is not all possible symptoms. Contact your medical provider for any symptoms that are sever or concerning to you.   MAKE SURE YOU   Understand these instructions.  Will watch your condition.  Will get help right away if you are not doing well or get worse.  Your e-visit answers were reviewed by a board certified advanced clinical practitioner to complete your personal care plan.  Depending on the condition, your plan could have included both over the counter or prescription medications.  If there is a problem please reply once you have received a response from your provider.  Your safety is important to [place].  If you have drug allergies check your prescription carefully.    You can use MyChart to ask questions about today's visit, request a non-urgent call back, or ask for a work or school excuse for 24 hours related to this e-Visit. If it has been greater than 24 hours you will need to follow up with your provider, or enter a new e-Visit to address those concerns. You will get an e-mail in the next two days asking about your experience.  I hope that your e-visit has been valuable and will speed your recovery. Thank you for using e-visits.

## 2019-02-09 LAB — NOVEL CORONAVIRUS, NAA: SARS-CoV-2, NAA: NOT DETECTED

## 2019-03-07 ENCOUNTER — Other Ambulatory Visit: Payer: Self-pay

## 2019-03-10 ENCOUNTER — Other Ambulatory Visit: Payer: Self-pay

## 2019-03-10 ENCOUNTER — Encounter: Payer: Self-pay | Admitting: Gynecology

## 2019-03-10 ENCOUNTER — Ambulatory Visit (INDEPENDENT_AMBULATORY_CARE_PROVIDER_SITE_OTHER): Payer: 59 | Admitting: Gynecology

## 2019-03-10 VITALS — BP 124/80 | Ht 67.0 in | Wt 360.0 lb

## 2019-03-10 DIAGNOSIS — Z01419 Encounter for gynecological examination (general) (routine) without abnormal findings: Secondary | ICD-10-CM

## 2019-03-10 NOTE — Patient Instructions (Signed)
Follow-up in 1 year for annual exam, sooner if any issues. 

## 2019-03-10 NOTE — Progress Notes (Signed)
    Lindsey Figueroa 1986-05-14 [id number]        33 y.o.  G0P0000 for annual gynecologic exam.  Without gynecologic complaints.  Past medical history,surgical history, problem list, medications, allergies, family history and social history were all reviewed and documented as reviewed in the EPIC chart.  ROS:  Performed with pertinent positives and negatives included in the history, assessment and plan.   Additional significant findings : None   Exam: Caryn Bee assistant Vitals:   03/10/19 1155  BP: 124/80  Weight: (!) 360 lb (163.3 kg)  Height: 5\' 7"  (1.702 m)   Body mass index is 56.38 kg/m.  General appearance:  Normal affect, orientation and appearance. Skin: Grossly normal HEENT: Without gross lesions.  No cervical or supraclavicular adenopathy. Thyroid normal.  Lungs:  Clear without wheezing, rales or rhonchi Cardiac: RR, without RMG Abdominal:  Soft, nontender, without masses, guarding, rebound, organomegaly or hernia Breasts:  Examined lying and sitting without masses, retractions, discharge or axillary adenopathy. Pelvic:  Ext, BUS, Vagina: Normal  Cervix: Normal  Uterus: Grossly normal size midline mobile nontender  Adnexa: Without gross masses or tenderness    Anus and perineum: Normal    Assessment/Plan:  33 y.o. G0P0000 female for annual gynecologic exam.  With regular menses, currently not sexually active  1. Patient had questions about pregnancy possibilities.  Has not used contraception consistently throughout her life and never achieved pregnancy.  She has regular monthly menses with moliminal symptoms.  Currently not sexually active.  We discussed infertility in the evaluation to include female factor.  Also discussed decreased fecundity with advancing maternal age.  Recommended that when she becomes sexually active and desires pregnancy if she is not pregnant within 6 months of trying to pursue evaluation.  Had been using barrier contraception with condoms  previously which was acceptable. 2. STD screening discussed offered and declined. 3. Pap smear/HPV 2019.  No Pap smear done today.  No history of abnormal Pap smears previously.  Plan repeat Pap smear/HPV at 5-year interval per current screening guidelines. 4. Health maintenance.  No routine lab work done as patient has a primary provider.  Follow-up 1 year, sooner as needed.   Anastasio Auerbach MD, 12:22 PM 03/10/2019

## 2019-04-07 ENCOUNTER — Encounter: Payer: Self-pay | Admitting: Gynecology

## 2019-12-01 ENCOUNTER — Other Ambulatory Visit: Payer: Self-pay

## 2019-12-01 ENCOUNTER — Ambulatory Visit (INDEPENDENT_AMBULATORY_CARE_PROVIDER_SITE_OTHER): Payer: 59 | Admitting: Family Medicine

## 2019-12-01 ENCOUNTER — Encounter: Payer: Self-pay | Admitting: Family Medicine

## 2019-12-01 DIAGNOSIS — R3129 Other microscopic hematuria: Secondary | ICD-10-CM

## 2019-12-01 DIAGNOSIS — Z6841 Body Mass Index (BMI) 40.0 and over, adult: Secondary | ICD-10-CM

## 2019-12-01 LAB — CBC WITH DIFFERENTIAL/PLATELET
Basophils Absolute: 0.1 10*3/uL (ref 0.0–0.1)
Basophils Relative: 1.2 % (ref 0.0–3.0)
Eosinophils Absolute: 0.1 10*3/uL (ref 0.0–0.7)
Eosinophils Relative: 1.3 % (ref 0.0–5.0)
HCT: 35.1 % — ABNORMAL LOW (ref 36.0–46.0)
Hemoglobin: 11.3 g/dL — ABNORMAL LOW (ref 12.0–15.0)
Lymphocytes Relative: 54.3 % — ABNORMAL HIGH (ref 12.0–46.0)
Lymphs Abs: 3 10*3/uL (ref 0.7–4.0)
MCHC: 32.3 g/dL (ref 30.0–36.0)
MCV: 74.9 fl — ABNORMAL LOW (ref 78.0–100.0)
Monocytes Absolute: 0.3 10*3/uL (ref 0.1–1.0)
Monocytes Relative: 6 % (ref 3.0–12.0)
Neutro Abs: 2.1 10*3/uL (ref 1.4–7.7)
Neutrophils Relative %: 37.2 % — ABNORMAL LOW (ref 43.0–77.0)
Platelets: 208 10*3/uL (ref 150.0–400.0)
RBC: 4.68 Mil/uL (ref 3.87–5.11)
RDW: 16 % — ABNORMAL HIGH (ref 11.5–15.5)
WBC: 5.5 10*3/uL (ref 4.0–10.5)

## 2019-12-01 LAB — POC URINALSYSI DIPSTICK (AUTOMATED)
Bilirubin, UA: NEGATIVE
Glucose, UA: NEGATIVE
Ketones, UA: NEGATIVE
Leukocytes, UA: NEGATIVE
Nitrite, UA: NEGATIVE
Protein, UA: NEGATIVE
Spec Grav, UA: 1.025 (ref 1.010–1.025)
Urobilinogen, UA: 0.2 E.U./dL
pH, UA: 6 (ref 5.0–8.0)

## 2019-12-01 LAB — COMPREHENSIVE METABOLIC PANEL
ALT: 14 U/L (ref 0–35)
AST: 15 U/L (ref 0–37)
Albumin: 3.7 g/dL (ref 3.5–5.2)
Alkaline Phosphatase: 62 U/L (ref 39–117)
BUN: 12 mg/dL (ref 6–23)
CO2: 26 mEq/L (ref 19–32)
Calcium: 8.6 mg/dL (ref 8.4–10.5)
Chloride: 107 mEq/L (ref 96–112)
Creatinine, Ser: 0.72 mg/dL (ref 0.40–1.20)
GFR: 110.81 mL/min (ref >60.00)
Glucose, Bld: 99 mg/dL (ref 70–99)
Potassium: 4.4 mEq/L (ref 3.5–5.1)
Sodium: 139 mEq/L (ref 135–145)
Total Bilirubin: 0.3 mg/dL (ref 0.2–1.2)
Total Protein: 6.2 g/dL (ref 6.0–8.3)

## 2019-12-01 LAB — LIPID PANEL
Cholesterol: 149 mg/dL (ref 0–200)
HDL: 52.1 mg/dL (ref >39.00)
LDL Cholesterol: 81 mg/dL (ref 0–99)
NonHDL: 96.93
Total CHOL/HDL Ratio: 3
Triglycerides: 81 mg/dL (ref 0.0–149.0)
VLDL: 16.2 mg/dL (ref 0.0–40.0)

## 2019-12-01 LAB — POCT URINE PREGNANCY: Preg Test, Ur: NEGATIVE

## 2019-12-01 LAB — TSH: TSH: 2.16 u[IU]/mL (ref 0.35–4.50)

## 2019-12-01 MED ORDER — NOVOFINE 30G X 8 MM MISC: 1.0000 | 5 refills | Status: DC | PRN

## 2019-12-01 MED ORDER — SAXENDA 18 MG/3ML ~~LOC~~ SOPN: 3.0000 mg | PEN_INJECTOR | Freq: Every day | SUBCUTANEOUS | 5 refills | Status: DC

## 2019-12-01 NOTE — Assessment & Plan Note (Signed)
Info for healthy weight and wellness given to pt Pt educated on how to take saxenda and first shot was given in office  F/u 3 months or sooner prn

## 2019-12-01 NOTE — Progress Notes (Signed)
Patient ID: Lindsey Figueroa, female    DOB: [birth year]  Age: 34 y.o. MRN: [id number]    Subjective:  Subjective  HPI Lindsey Figueroa Elms presents for weight loss--- she is requesting saxenda   She is counting calories and walking   Her sister is taking saxenda and has had good success.  No complaints  Review of Systems  Constitutional: Negative for appetite change, diaphoresis, fatigue and unexpected weight change.  Eyes: Negative for pain, redness and visual disturbance.  Respiratory: Negative for cough, chest tightness, shortness of breath and wheezing.   Cardiovascular: Negative for chest pain, palpitations and leg swelling.  Endocrine: Negative for cold intolerance, heat intolerance, polydipsia, polyphagia and polyuria.  Genitourinary: Negative for difficulty urinating, dysuria and frequency.  Neurological: Negative for dizziness, light-headedness, numbness and headaches.    History Past Medical History:  Diagnosis Date  . STD (sexually transmitted disease)    Chlamydia    She has a past surgical history that includes Wisdom tooth extraction and Tonsillectomy and adenoidectomy.   Her family history includes Cancer - Lung in her maternal grandfather; Diabetes in her mother; Hypertension in her maternal grandmother.She reports that she has never smoked. She has never used smokeless tobacco. She reports that she does not drink alcohol or use drugs.  No current outpatient medications on file prior to visit.   No current facility-administered medications on file prior to visit.     Objective:  Objective  Physical Exam Vitals and nursing note reviewed.  Constitutional:      Appearance: She is well-developed.  HENT:     Head: Normocephalic and atraumatic.  Eyes:     Conjunctiva/sclera: Conjunctivae normal.  Neck:     Thyroid: No thyromegaly.     Vascular: No carotid bruit or JVD.  Cardiovascular:     Rate and Rhythm: Normal rate and regular rhythm.     Heart sounds: Normal  heart sounds. No murmur.  Pulmonary:     Effort: Pulmonary effort is normal. No respiratory distress.     Breath sounds: Normal breath sounds. No wheezing or rales.  Chest:     Chest wall: No tenderness.  Musculoskeletal:     Cervical back: Normal range of motion and neck supple.  Neurological:     Mental Status: She is alert and oriented to person, place, and time.    BP 128/82 (BP Location: Right Arm, Patient Position: Sitting, Cuff Size: Large)   Pulse 68   Temp (!) 97.3 F (36.3 C) (Temporal)   Resp 18   Ht 5\' 7"  (1.702 m)   Wt (!) 360 lb 12.8 oz (163.7 kg)   SpO2 98%   BMI 56.51 kg/m  Wt Readings from Last 3 Encounters:  12/01/19 (!) 360 lb 12.8 oz (163.7 kg)  03/10/19 (!) 360 lb (163.3 kg)  09/08/18 (!) 319 lb (144.7 kg)     Lab Results  Component Value Date   WBC 6.2 12/21/2017   HGB 11.6 (L) 12/21/2017   HCT 34.8 (L) 12/21/2017   PLT 203 12/21/2017   GLUCOSE 84 12/21/2017   CHOL 134 12/21/2017   TRIG 55 12/21/2017   HDL 59 12/21/2017   LDLCALC 61 12/21/2017   ALT 12 12/21/2017   AST 16 12/21/2017   NA 139 12/21/2017   K 4.2 12/21/2017   CL 105 12/21/2017   CREATININE 0.82 12/21/2017   BUN 14 12/21/2017   CO2 22 12/21/2017   TSH 1.80 12/21/2017   HGBA1C 6.0 07/24/2011    No  results found.   Assessment & Plan:  Plan  I am having Arlana L. Valeri start on Saxenda and NovoFine.  Meds ordered this encounter  Medications  . Liraglutide -Weight Management (SAXENDA) 18 MG/3ML SOPN    Sig: Inject 0.5 mLs (3 mg total) into the skin daily.    Dispense:  3 mL    Refill:  5  . Insulin Pen Needle (NOVOFINE) 30G X 8 MM MISC    Sig: Inject 10 each into the skin as needed.    Dispense:  5 packet    Refill:  5    Problem List Items Addressed This Visit      Unprioritized   MORBID OBESITY - Primary   Relevant Medications   Liraglutide -Weight Management (SAXENDA) 18 MG/3ML SOPN   Insulin Pen Needle (NOVOFINE) 30G X 8 MM MISC   Other Relevant Orders    Lipid panel   CBC with Differential/Platelet   TSH   Comprehensive metabolic panel   POCT urine pregnancy   POCT Urinalysis Dipstick (Automated)   Morbid obesity with BMI of 50.0-59.9, adult ([facility])    Info for healthy weight and wellness given to pt Pt educated on how to take saxenda and first shot was given in office  F/u 3 months or sooner prn       Relevant Medications   Liraglutide -Weight Management (SAXENDA) 18 MG/3ML SOPN      Follow-up: Return in about 3 months (around 03/02/2020), or if symptoms worsen or fail to improve.  Ann Held, DO

## 2019-12-01 NOTE — Patient Instructions (Signed)

## 2019-12-01 NOTE — Addendum Note (Signed)
Addended by: Mervin Kung A on: 12/01/2019 01:25 PM   Modules accepted: Orders

## 2019-12-02 LAB — URINE CULTURE
MICRO NUMBER:: 10485284
SPECIMEN QUALITY:: ADEQUATE

## 2020-03-08 ENCOUNTER — Ambulatory Visit: Payer: 59 | Admitting: Family Medicine

## 2020-03-17 ENCOUNTER — Other Ambulatory Visit: Payer: Self-pay

## 2020-03-17 ENCOUNTER — Emergency Department (HOSPITAL_BASED_OUTPATIENT_CLINIC_OR_DEPARTMENT_OTHER)
Admission: EM | Admit: 2020-03-17 | Discharge: 2020-03-17 | Disposition: A | Discharge status: Home / Self Care | Payer: Self-pay | Attending: Emergency Medicine | Admitting: Emergency Medicine

## 2020-03-17 ENCOUNTER — Encounter (HOSPITAL_BASED_OUTPATIENT_CLINIC_OR_DEPARTMENT_OTHER): Payer: Self-pay

## 2020-03-17 DIAGNOSIS — H6122 Impacted cerumen, left ear: Secondary | ICD-10-CM | POA: Insufficient documentation

## 2020-03-17 DIAGNOSIS — R55 Syncope and collapse: Secondary | ICD-10-CM | POA: Insufficient documentation

## 2020-03-17 DIAGNOSIS — I10 Essential (primary) hypertension: Secondary | ICD-10-CM | POA: Insufficient documentation

## 2020-03-17 DIAGNOSIS — R42 Dizziness and giddiness: Secondary | ICD-10-CM

## 2020-03-17 LAB — URINALYSIS, MICROSCOPIC (REFLEX)

## 2020-03-17 LAB — BASIC METABOLIC PANEL
Anion gap: 9 (ref 5–15)
BUN: 13 mg/dL (ref 6–20)
CO2: 23 mmol/L (ref 22–32)
Calcium: 8.6 mg/dL — ABNORMAL LOW (ref 8.9–10.3)
Chloride: 104 mmol/L (ref 98–111)
Creatinine, Ser: 0.77 mg/dL (ref 0.44–1.00)
GFR calc Af Amer: >60 mL/min (ref >60)
GFR calc non Af Amer: >60 mL/min (ref >60)
Glucose, Bld: 114 mg/dL — ABNORMAL HIGH (ref 70–99)
Potassium: 4.2 mmol/L (ref 3.5–5.1)
Sodium: 136 mmol/L (ref 135–145)

## 2020-03-17 LAB — URINALYSIS, ROUTINE W REFLEX MICROSCOPIC
Bilirubin Urine: NEGATIVE
Glucose, UA: NEGATIVE mg/dL
Ketones, ur: NEGATIVE mg/dL
Leukocytes,Ua: NEGATIVE
Nitrite: NEGATIVE
Protein, ur: NEGATIVE mg/dL
Specific Gravity, Urine: 1.015 (ref 1.005–1.030)
pH: 6 (ref 5.0–8.0)

## 2020-03-17 LAB — CBC
HCT: 38.4 % (ref 36.0–46.0)
Hemoglobin: 11.9 g/dL — ABNORMAL LOW (ref 12.0–15.0)
MCH: 23.8 pg — ABNORMAL LOW (ref 26.0–34.0)
MCHC: 31 g/dL (ref 30.0–36.0)
MCV: 76.8 fL — ABNORMAL LOW (ref 80.0–100.0)
Platelets: 233 10*3/uL (ref 150–400)
RBC: 5 MIL/uL (ref 3.87–5.11)
RDW: 16.5 % — ABNORMAL HIGH (ref 11.5–15.5)
WBC: 7.5 10*3/uL (ref 4.0–10.5)
nRBC: 0 % (ref 0.0–0.2)

## 2020-03-17 LAB — PREGNANCY, URINE: Preg Test, Ur: NEGATIVE

## 2020-03-17 MED ORDER — MECLIZINE HCL 25 MG PO TABS: 25.0000 mg | ORAL_TABLET | Freq: Three times a day (TID) | ORAL | 0 refills | Status: DC | PRN

## 2020-03-17 MED ORDER — CARBAMIDE PEROXIDE 6.5 % OT SOLN: 5.0000 [drp] | Freq: Two times a day (BID) | OTIC | 0 refills | Status: DC

## 2020-03-17 MED ORDER — MECLIZINE HCL 25 MG PO TABS
25.0000 mg | ORAL_TABLET | Freq: Once | ORAL | Status: AC
  Administered 2020-03-17: 25 mg via ORAL
  Filled 2020-03-17: qty 1

## 2020-03-17 NOTE — Discharge Instructions (Addendum)
You were seen in the emergency department for dizziness and popping in your ears.  You have a lot of earwax in your right ear and we are prescribing you some drops for that.  Your symptoms are most vertigo and we are prescribing you medication to help with that.  Your blood pressure was elevated here and will be important for you to follow-up with your regular doctor to recheck this.

## 2020-03-17 NOTE — ED Provider Notes (Signed)
[facility] EMERGENCY DEPARTMENT Provider Note   CSN: [id number] Arrival date & time: 03/17/20  1400     History Chief Complaint  Patient presents with  . Dizziness    Lindsey Figueroa is a 34 y.o. female. She has borderline diabetes. She is complaining of 2 weeks of on and off dizziness which is room spinning. Very positional does not bother her usually driving or walking but when she lays down to go to bed hoping she needs over to pick up something from the floor she feels like she is going to fall. Associated with some popping in her ears and vague headache. No fevers chills cough shortness of breath nausea vomiting diarrhea or urinary symptoms. Last menstrual period is 2 weeks ago  The history is provided by the patient.  Dizziness Quality:  Room spinning Severity:  Moderate Onset quality:  Gradual Duration:  2 weeks Timing:  Intermittent Progression:  Unchanged Chronicity:  New Context: bending over and head movement   Context: not with loss of consciousness and not with physical activity   Relieved by:  Change in position Worsened by:  Lying down Ineffective treatments:  None tried Associated symptoms: headaches   Associated symptoms: no chest pain, no hearing loss, no shortness of breath, no tinnitus, no vision changes, no vomiting and no weakness   Risk factors: no hx of vertigo        Past Medical History:  Diagnosis Date  . STD (sexually transmitted disease)    Chlamydia    Patient Active Problem List   Diagnosis Date Noted  . Preventative health care 12/21/2017  . Morbid obesity with BMI of 50.0-59.9, adult ([facility]) 09/22/2013  . Vaginal discharge 05/20/2012  . UTI symptoms 05/20/2012  . Contraception management 04/03/2012  . MORBID OBESITY 06/23/2010  . HIP PAIN, LEFT 05/20/2010  . PALPITATIONS, RECURRENT 05/20/2010    Past Surgical History:  Procedure Laterality Date  . TONSILLECTOMY AND ADENOIDECTOMY    . WISDOM TOOTH EXTRACTION        OB History    Gravida  0   Para  0   Term  0   Preterm  0   AB  0   Living  0     SAB  0   TAB  0   Ectopic  0   Multiple  0   Live Births  0           Family History  Problem Relation Age of Onset  . Diabetes Mother   . Cancer - Lung Maternal Grandfather        Non smoker  . Hypertension Maternal Grandmother     Social History   Tobacco Use  . Smoking status: Never Smoker  . Smokeless tobacco: Never Used  Vaping Use  . Vaping Use: Never used  Substance Use Topics  . Alcohol use: No  . Drug use: No    Home Medications Prior to Admission medications   Medication Sig Start Date End Date Taking? Authorizing Provider  Insulin Pen Needle (NOVOFINE) 30G X 8 MM MISC Inject 10 each into the skin as needed. 12/01/19   Donato Schultz, DO  Liraglutide -Weight Management (SAXENDA) 18 MG/3ML SOPN Inject 0.5 mLs (3 mg total) into the skin daily. 12/01/19   Donato Schultz, DO    Allergies    Patient has no known allergies.  Review of Systems   Review of Systems  Constitutional: Negative for fever.  HENT: Positive for  ear pain. Negative for hearing loss, sore throat and tinnitus.   Eyes: Negative for visual disturbance.  Respiratory: Negative for shortness of breath.   Cardiovascular: Negative for chest pain.  Gastrointestinal: Negative for abdominal pain and vomiting.  Genitourinary: Negative for dysuria.  Musculoskeletal: Negative for neck pain.  Skin: Negative for rash.  Neurological: Positive for dizziness and headaches. Negative for weakness.    Physical Exam Updated Vital Signs BP (!) 146/116 (BP Location: Left Arm)   Pulse 65   Temp 98.6 F (37 C) (Oral)   Resp 20   Ht 5\' 7"  (1.702 m)   Wt (!) 168.7 kg   LMP 02/28/2020   SpO2 100%   BMI 58.26 kg/m   Physical Exam Vitals and nursing note reviewed.  Constitutional:      General: She is not in acute distress.    Appearance: Normal appearance. She is well-developed.   HENT:     Head: Normocephalic and atraumatic.     Right Ear: There is impacted cerumen.     Left Ear: Tympanic membrane normal.     Nose: Nose normal.     Mouth/Throat:     Mouth: Mucous membranes are moist.     Pharynx: Oropharynx is clear.  Eyes:     Extraocular Movements: Extraocular movements intact.     Conjunctiva/sclera: Conjunctivae normal.     Pupils: Pupils are equal, round, and reactive to light.     Comments: She has a few beats of horizontal nystagmus  Cardiovascular:     Rate and Rhythm: Normal rate and regular rhythm.     Heart sounds: No murmur heard.   Pulmonary:     Effort: Pulmonary effort is normal. No respiratory distress.     Breath sounds: Normal breath sounds.  Abdominal:     Palpations: Abdomen is soft.     Tenderness: There is no abdominal tenderness.  Musculoskeletal:        General: No deformity or signs of injury. Normal range of motion.     Cervical back: Normal range of motion and neck supple.  Skin:    General: Skin is warm and dry.     Capillary Refill: Capillary refill takes less than 2 seconds.  Neurological:     General: No focal deficit present.     Mental Status: She is alert and oriented to person, place, and time.     Cranial Nerves: No cranial nerve deficit.     Sensory: No sensory deficit.     Motor: No weakness.     Gait: Gait normal.     ED Results / Procedures / Treatments   Labs (all labs ordered are listed, but only abnormal results are displayed) Labs Reviewed  BASIC METABOLIC PANEL - Abnormal; Notable for the following components:      Result Value   Glucose, Bld 114 (*)    Calcium 8.6 (*)    All other components within normal limits  CBC - Abnormal; Notable for the following components:   Hemoglobin 11.9 (*)    MCV 76.8 (*)    MCH 23.8 (*)    RDW 16.5 (*)    All other components within normal limits  URINALYSIS, ROUTINE W REFLEX MICROSCOPIC - Abnormal; Notable for the following components:   Hgb urine dipstick  LARGE (*)    All other components within normal limits  URINALYSIS, MICROSCOPIC (REFLEX) - Abnormal; Notable for the following components:   Bacteria, UA FEW (*)    All other components within  normal limits  PREGNANCY, URINE    EKG EKG Interpretation  Date/Time:  Wednesday March 17 2020 14:29:17 EDT Ventricular Rate:  73 PR Interval:  148 QRS Duration: 78 QT Interval:  376 QTC Calculation: 414 R Axis:   82 Text Interpretation: Normal sinus rhythm Normal ECG No old tracing to compare Confirmed by Meridee Score [phone]) on 03/17/2020 4:07:49 PM   Radiology No results found.  Procedures Procedures (including critical care time)  Medications Ordered in ED Medications  meclizine (ANTIVERT) tablet 25 mg (has no administration in time range)    ED Course  I have reviewed the triage vital signs and the nursing notes.  Pertinent labs & imaging results that were available during my care of the patient were reviewed by me and considered in my medical decision making (see chart for details).  Clinical Course as of Mar 18 2331  Wed Mar 17, 2020  7062 The tech was able to irrigate her right ear with some improvement in her symptoms.  She still has a good amount of wax in there so we will put her on some ear drops.   [MB]    Clinical Course User Index [MB] Terrilee Files, MD   MDM Rules/Calculators/A&P                         This patient complains of room spinning ear pain; this involves an extensive number of treatment Options and is a complaint that carries with it a high risk of complications and Morbidity. The differential includes vertigo, sinusitis, otitis media, less likely stroke, hypertensive urgency  I ordered, reviewed and interpreted labs, which included CBC with normal hemoglobin normal white count chemistries with mild elevated glucose low calcium, urinalysis without signs of infection, pregnancy test negative I ordered medication meclizine with some  improvement in her symptoms Previous records obtained and reviewed in epic, no significant visits  After the interventions stated above, I reevaluated the patient and found patient to be somewhat improved.  Her right ear was irrigated out with some improvement in the cerumen.  Will prescribe her some more medication for that and meclizine.  Recommended that she also see her primary care doctor as her blood pressure was elevated although upon discharge it was more normal.  Return instructions discussed   Final Clinical Impression(s) / ED Diagnoses Final diagnoses:  Vertigo  Impacted cerumen of left ear  Hypertension, unspecified type    Rx / DC Orders ED Discharge Orders         Ordered    meclizine (ANTIVERT) 25 MG tablet  3 times daily PRN        03/17/20 1812    carbamide peroxide (DEBROX) 6.5 % OTIC solution  2 times daily        03/17/20 1812           Terrilee Files, MD 03/17/20 2336

## 2020-03-17 NOTE — ED Triage Notes (Addendum)
Pt c/o dizziness that is worse with change in positions, near syncopal, HA, "ears popping" x 1.5 weeks-NAD-steady gait

## 2020-03-17 NOTE — ED Notes (Addendum)
Pt noted a difference after 10 minutes.  She does state she is still dizzy

## 2020-07-08 ENCOUNTER — Ambulatory Visit (HOSPITAL_BASED_OUTPATIENT_CLINIC_OR_DEPARTMENT_OTHER)
Admission: RE | Admit: 2020-07-08 | Discharge: 2020-07-08 | Disposition: A | Discharge status: Home / Self Care | Payer: 59 | Source: Ambulatory Visit | Attending: Family Medicine | Admitting: Family Medicine

## 2020-07-08 ENCOUNTER — Ambulatory Visit (INDEPENDENT_AMBULATORY_CARE_PROVIDER_SITE_OTHER): Payer: 59 | Admitting: Family Medicine

## 2020-07-08 ENCOUNTER — Encounter: Payer: Self-pay | Admitting: Family Medicine

## 2020-07-08 ENCOUNTER — Other Ambulatory Visit: Payer: Self-pay

## 2020-07-08 DIAGNOSIS — R600 Localized edema: Secondary | ICD-10-CM

## 2020-07-08 DIAGNOSIS — M545 Low back pain, unspecified: Secondary | ICD-10-CM | POA: Diagnosis not present

## 2020-07-08 DIAGNOSIS — M549 Dorsalgia, unspecified: Secondary | ICD-10-CM | POA: Insufficient documentation

## 2020-07-08 DIAGNOSIS — R030 Elevated blood-pressure reading, without diagnosis of hypertension: Secondary | ICD-10-CM

## 2020-07-08 MED ORDER — TRAMADOL HCL 50 MG PO TABS: 50.0000 mg | ORAL_TABLET | Freq: Three times a day (TID) | ORAL | 0 refills | Status: AC | PRN

## 2020-07-08 MED ORDER — HYDROCHLOROTHIAZIDE 25 MG PO TABS: 25.0000 mg | ORAL_TABLET | Freq: Every day | ORAL | 3 refills | Status: DC

## 2020-07-08 MED ORDER — CYCLOBENZAPRINE HCL 10 MG PO TABS: 10.0000 mg | ORAL_TABLET | Freq: Three times a day (TID) | ORAL | 0 refills | Status: DC | PRN

## 2020-07-08 MED ORDER — PREDNISONE 10 MG PO TABS: ORAL_TABLET | ORAL | 0 refills | Status: DC

## 2020-07-08 NOTE — Progress Notes (Signed)
Patient ID: Lindsey Figueroa, female    DOB: 1986-06-04  Age: 34 y.o. MRN: [id number]    Subjective:  Subjective  HPI Lindsey Figueroa presents for back pain since Monday.  She bent over to pick up a sweatshirt and felt a pop in her low back and pain radiates down L leg to back of knee.  Pain is constant.  Leg feels like it will give out but it has not  She also c/o low ext swelling-- no calf pain and she is struggling to lose weight and is seeking help with this  Review of Systems  Constitutional: Negative for appetite change, diaphoresis, fatigue and unexpected weight change.  Eyes: Negative for pain, redness and visual disturbance.  Respiratory: Negative for cough, chest tightness, shortness of breath and wheezing.   Cardiovascular: Negative for chest pain, palpitations and leg swelling.  Endocrine: Negative for cold intolerance, heat intolerance, polydipsia, polyphagia and polyuria.  Genitourinary: Negative for difficulty urinating, dysuria and frequency.  Musculoskeletal: Positive for back pain.  Neurological: Negative for dizziness, light-headedness, numbness and headaches.    History Past Medical History:  Diagnosis Date  . STD (sexually transmitted disease)    Chlamydia    She has a past surgical history that includes Wisdom tooth extraction and Tonsillectomy and adenoidectomy.   Her family history includes Cancer - Lung in her maternal grandfather; Diabetes in her mother; Hypertension in her maternal grandmother.She reports that she has never smoked. She has never used smokeless tobacco. She reports that she does not drink alcohol and does not use drugs.  Current Outpatient Medications on File Prior to Visit  Medication Sig Dispense Refill  . carbamide peroxide (DEBROX) 6.5 % OTIC solution Place 5 drops into the right ear 2 (two) times daily. 15 mL 0  . meclizine (ANTIVERT) 25 MG tablet Take 1 tablet (25 mg total) by mouth 3 (three) times daily as needed for dizziness. 30  tablet 0   No current facility-administered medications on file prior to visit.     Objective:  Objective  Physical Exam Vitals and nursing note reviewed.  Constitutional:      Appearance: She is well-developed and well-nourished.  HENT:     Head: Normocephalic and atraumatic.  Eyes:     Extraocular Movements: EOM normal.     Conjunctiva/sclera: Conjunctivae normal.  Neck:     Thyroid: No thyromegaly.     Vascular: No carotid bruit or JVD.  Cardiovascular:     Rate and Rhythm: Normal rate and regular rhythm.     Heart sounds: Normal heart sounds. No murmur heard.   Pulmonary:     Effort: Pulmonary effort is normal. No respiratory distress.     Breath sounds: Normal breath sounds. No wheezing or rales.  Chest:     Chest wall: No tenderness.  Musculoskeletal:     Cervical back: Normal range of motion and neck supple.     Right lower leg: No tenderness. 2+ Pitting Edema present.     Left lower leg: No tenderness. 2+ Pitting Edema present.  Neurological:     General: No focal deficit present.     Mental Status: She is alert and oriented to person, place, and time.     Motor: Weakness present.     Gait: Gait abnormal.     Deep Tendon Reflexes: Reflexes normal.     Comments: + weakness in L hip flexor    Psychiatric:        Mood and Affect: Mood and  affect normal.    BP 130/90 (BP Location: Right Arm, Patient Position: Sitting, Cuff Size: Large)   Pulse 64   Temp 98.2 F (36.8 C) (Oral)   Resp 18   Ht 5\' 7"  (1.702 m)   Wt (!) 373 lb 6.4 oz (169.4 kg)   SpO2 99%   BMI 58.48 kg/m  Wt Readings from Last 3 Encounters:  07/08/20 (!) 373 lb 6.4 oz (169.4 kg)  03/17/20 (!) 372 lb (168.7 kg)  12/01/19 (!) 360 lb 12.8 oz (163.7 kg)     Lab Results  Component Value Date   WBC 7.5 03/17/2020   HGB 11.9 (L) 03/17/2020   HCT 38.4 03/17/2020   PLT 233 03/17/2020   GLUCOSE 114 (H) 03/17/2020   CHOL 149 12/01/2019   TRIG 81.0 12/01/2019   HDL 52.10 12/01/2019    LDLCALC 81 12/01/2019   ALT 14 12/01/2019   AST 15 12/01/2019   NA 136 03/17/2020   K 4.2 03/17/2020   CL 104 03/17/2020   CREATININE 0.77 03/17/2020   BUN 13 03/17/2020   CO2 23 03/17/2020   TSH 2.16 12/01/2019   HGBA1C 6.0 07/24/2011    No results found.   Assessment & Plan:  Plan  I have discontinued Lindsey Figueroa's Saxenda and NovoFine. I am also having her start on hydrochlorothiazide, cyclobenzaprine, predniSONE, and traMADol. Additionally, I am having her maintain her meclizine and carbamide peroxide.  Meds ordered this encounter  Medications  . hydrochlorothiazide (HYDRODIURIL) 25 MG tablet    Sig: Take 1 tablet (25 mg total) by mouth daily.    Dispense:  90 tablet    Refill:  3  . cyclobenzaprine (FLEXERIL) 10 MG tablet    Sig: Take 1 tablet (10 mg total) by mouth 3 (three) times daily as needed for muscle spasms.    Dispense:  30 tablet    Refill:  0  . predniSONE (DELTASONE) 10 MG tablet    Sig: TAKE 3 TABLETS PO QD FOR 3 DAYS THEN TAKE 2 TABLETS PO QD FOR 3 DAYS THEN TAKE 1 TABLET PO QD FOR 3 DAYS THEN TAKE 1/2 TAB PO QD FOR 3 DAYS    Dispense:  20 tablet    Refill:  0  . traMADol (ULTRAM) 50 MG tablet    Sig: Take 1 tablet (50 mg total) by mouth every 8 (eight) hours as needed for up to 5 days.    Dispense:  15 tablet    Refill:  0    Problem List Items Addressed This Visit      Unprioritized   Elevated BP without diagnosis of hypertension    Pt has a lot of back pain Dash diet  F/u 2 weeks       Relevant Medications   hydrochlorothiazide (HYDRODIURIL) 25 MG tablet   Low back pain with radiation    F/u 2 weeks or sooner prn  Heat /  Ice  Rest       Relevant Medications   cyclobenzaprine (FLEXERIL) 10 MG tablet   predniSONE (DELTASONE) 10 MG tablet   Other Relevant Orders   DG Lumbar Spine Complete (Completed)   Lower extremity edema    Elevated legs Compression socks hctz  F/u 2 weeks or sooner prn       Relevant Medications    hydrochlorothiazide (HYDRODIURIL) 25 MG tablet   MORBID OBESITY - Primary   Relevant Medications   hydrochlorothiazide (HYDRODIURIL) 25 MG tablet   Other Relevant Orders   Amb  Ref to Medical Weight Management      Follow-up: Return in about 3 weeks (around 07/29/2020), or if symptoms worsen or fail to improve, for hypertension.  Donato Schultz, DO

## 2020-07-08 NOTE — Patient Instructions (Signed)
Acute Back Pain, Adult Acute back pain is sudden and usually short-lived. It is often caused by an injury to the muscles and tissues in the back. The injury may result from:  A muscle or ligament getting overstretched or torn (strained). Ligaments are tissues that connect bones to each other. Lifting something improperly can cause a back strain.  Wear and tear (degeneration) of the spinal disks. Spinal disks are circular tissue that provides cushioning between the bones of the spine (vertebrae).  Twisting motions, such as while playing sports or doing yard work.  A hit to the back.  Arthritis. You may have a physical exam, lab tests, and imaging tests to find the cause of your pain. Acute back pain usually goes away with rest and home care. Follow these instructions at home: Managing pain, stiffness, and swelling  Take over-the-counter and prescription medicines only as told by your health care provider.  Your health care provider may recommend applying ice during the first 24-48 hours after your pain starts. To do this: ? Put ice in a plastic bag. ? Place a towel between your skin and the bag. ? Leave the ice on for 20 minutes, 2-3 times a day.  If directed, apply heat to the affected area as often as told by your health care provider. Use the heat source that your health care provider recommends, such as a moist heat pack or a heating pad. ? Place a towel between your skin and the heat source. ? Leave the heat on for 20-30 minutes. ? Remove the heat if your skin turns bright red. This is especially important if you are unable to feel pain, heat, or cold. You have a greater risk of getting burned. Activity   Do not stay in bed. Staying in bed for more than 1-2 days can delay your recovery.  Sit up and stand up straight. Avoid leaning forward when you sit, or hunching over when you stand. ? If you work at a desk, sit close to it so you do not need to lean over. Keep your chin tucked  in. Keep your neck drawn back, and keep your elbows bent at a right angle. Your arms should look like the letter "L." ? Sit high and close to the steering wheel when you drive. Add lower back (lumbar) support to your car seat, if needed.  Take short walks on even surfaces as soon as you are able. Try to increase the length of time you walk each day.  Do not sit, drive, or stand in one place for more than 30 minutes at a time. Sitting or standing for long periods of time can put stress on your back.  Do not drive or use heavy machinery while taking prescription pain medicine.  Use proper lifting techniques. When you bend and lift, use positions that put less stress on your back: ? Bend your knees. ? Keep the load close to your body. ? Avoid twisting.  Exercise regularly as told by your health care provider. Exercising helps your back heal faster and helps prevent back injuries by keeping muscles strong and flexible.  Work with a physical therapist to make a safe exercise program, as recommended by your health care provider. Do any exercises as told by your physical therapist. Lifestyle  Maintain a healthy weight. Extra weight puts stress on your back and makes it difficult to have good posture.  Avoid activities or situations that make you feel anxious or stressed. Stress and anxiety increase muscle   tension and can make back pain worse. Learn ways to manage anxiety and stress, such as through exercise. General instructions  Sleep on a firm mattress in a comfortable position. Try lying on your side with your knees slightly bent. If you lie on your back, put a pillow under your knees.  Follow your treatment plan as told by your health care provider. This may include: ? Cognitive or behavioral therapy. ? Acupuncture or massage therapy. ? Meditation or yoga. Contact a health care provider if:  You have pain that is not relieved with rest or medicine.  You have increasing pain going down  into your legs or buttocks.  Your pain does not improve after 2 weeks.  You have pain at night.  You lose weight without trying.  You have a fever or chills. Get help right away if:  You develop new bowel or bladder control problems.  You have unusual weakness or numbness in your arms or legs.  You develop nausea or vomiting.  You develop abdominal pain.  You feel faint. Summary  Acute back pain is sudden and usually short-lived.  Use proper lifting techniques. When you bend and lift, use positions that put less stress on your back.  Take over-the-counter and prescription medicines and apply heat or ice as directed by your health care provider. This information is not intended to replace advice given to you by your health care provider. Make sure you discuss any questions you have with your health care provider. Document Revised: 10/22/2018 Document Reviewed: 02/14/2017 Elsevier Patient Education  2020 [employer]  

## 2020-07-15 DIAGNOSIS — R030 Elevated blood-pressure reading, without diagnosis of hypertension: Secondary | ICD-10-CM | POA: Insufficient documentation

## 2020-07-15 NOTE — Assessment & Plan Note (Signed)
F/u 2 weeks or sooner prn  Heat /  Ice  Rest

## 2020-07-15 NOTE — Assessment & Plan Note (Signed)
Elevated legs Compression socks hctz  F/u 2 weeks or sooner prn

## 2020-07-15 NOTE — Assessment & Plan Note (Signed)
Pt has a lot of back pain Dash diet  F/u 2 weeks

## 2020-07-19 ENCOUNTER — Encounter: Payer: Self-pay | Admitting: Family Medicine

## 2020-07-19 ENCOUNTER — Ambulatory Visit: Payer: Self-pay | Admitting: Family Medicine

## 2020-07-20 NOTE — Telephone Encounter (Signed)
Take 2 for 2 days then 1 for 3 days

## 2020-07-20 NOTE — Telephone Encounter (Signed)
It depends on how many pills she has left ------how was she taking them ?  Should not be on pred long term.   If no improvement -- should have f/u and may need ortho referral.

## 2020-09-06 ENCOUNTER — Other Ambulatory Visit: Payer: Self-pay

## 2020-09-06 ENCOUNTER — Encounter: Payer: Self-pay | Admitting: Family Medicine

## 2020-09-06 ENCOUNTER — Other Ambulatory Visit: Payer: Self-pay | Admitting: Family Medicine

## 2020-09-06 ENCOUNTER — Ambulatory Visit (INDEPENDENT_AMBULATORY_CARE_PROVIDER_SITE_OTHER): Payer: 59 | Admitting: Family Medicine

## 2020-09-06 VITALS — BP 134/90 | HR 95 | Temp 98.0°F | Resp 18 | Ht 67.0 in | Wt 370.6 lb

## 2020-09-06 DIAGNOSIS — M25562 Pain in left knee: Secondary | ICD-10-CM

## 2020-09-06 DIAGNOSIS — R002 Palpitations: Secondary | ICD-10-CM

## 2020-09-06 DIAGNOSIS — I10 Essential (primary) hypertension: Secondary | ICD-10-CM

## 2020-09-06 DIAGNOSIS — R600 Localized edema: Secondary | ICD-10-CM | POA: Diagnosis not present

## 2020-09-06 DIAGNOSIS — M25561 Pain in right knee: Secondary | ICD-10-CM

## 2020-09-06 DIAGNOSIS — G8929 Other chronic pain: Secondary | ICD-10-CM | POA: Insufficient documentation

## 2020-09-06 MED ORDER — SAXENDA 18 MG/3ML ~~LOC~~ SOPN: PEN_INJECTOR | SUBCUTANEOUS | 5 refills | Status: DC

## 2020-09-06 MED ORDER — FUROSEMIDE 20 MG PO TABS: 20.0000 mg | ORAL_TABLET | Freq: Every day | ORAL | 3 refills | Status: DC

## 2020-09-06 MED ORDER — METOPROLOL SUCCINATE ER 50 MG PO TB24: 50.0000 mg | ORAL_TABLET | Freq: Every day | ORAL | 3 refills | Status: DC

## 2020-09-06 MED ORDER — INSULIN PEN NEEDLE 29G X 12.7MM MISC: 1 refills | Status: DC

## 2020-09-06 NOTE — Assessment & Plan Note (Signed)
saxenda ---  Start 0.6 mg x 1 week then inc 1.2 mg x 1 week etc F/u 3-4 weeks

## 2020-09-06 NOTE — Assessment & Plan Note (Signed)
Lasix 20 mg -- qd-----  Eat/ drink K rich foods Elevate leg --- compression socks

## 2020-09-06 NOTE — Assessment & Plan Note (Signed)
Start metoprolol daily Dash diet  F/u 3-4 weeks

## 2020-09-06 NOTE — Progress Notes (Signed)
Patient ID: Lindsey Figueroa, female    DOB: January 12, 1986  Age: 35 y.o. MRN: [id number]    Subjective:  Subjective  HPI Lindsey Figueroa presents for swelling in both legs x few months and palpitations -- the palp started just a few weeks ago and occurs daily.  The swelling has occurred several months and worsens as the day goes on  Pt also c/o knee pain b/l that is ongoing === she has not seen anyone for this--- no known injury and is not taking anything for it She is also struggling to lose weight and would like to try saxenda -- her sister is taking it and has lost a lot of weight   Review of Systems  Constitutional: Negative for appetite change, diaphoresis, fatigue and unexpected weight change.  Eyes: Negative for pain, redness and visual disturbance.  Respiratory: Negative for cough, chest tightness, shortness of breath and wheezing.   Cardiovascular: Positive for leg swelling. Negative for chest pain and palpitations.  Endocrine: Negative for cold intolerance, heat intolerance, polydipsia, polyphagia and polyuria.  Genitourinary: Negative for difficulty urinating, dysuria and frequency.  Musculoskeletal: Positive for arthralgias and joint swelling.  Neurological: Negative for dizziness, light-headedness, numbness and headaches.    History Past Medical History:  Diagnosis Date   STD (sexually transmitted disease)    Chlamydia    She has a past surgical history that includes Wisdom tooth extraction and Tonsillectomy and adenoidectomy.   Her family history includes Cancer - Lung in her maternal grandfather; Diabetes in her mother; Hypertension in her maternal grandmother.She reports that she has never smoked. She has never used smokeless tobacco. She reports that she does not drink alcohol and does not use drugs.  Current Outpatient Medications on File Prior to Visit  Medication Sig Dispense Refill   carbamide peroxide (DEBROX) 6.5 % OTIC solution Place 5 drops into the right  ear 2 (two) times daily. 15 mL 0   cyclobenzaprine (FLEXERIL) 10 MG tablet Take 1 tablet (10 mg total) by mouth 3 (three) times daily as needed for muscle spasms. 30 tablet 0   No current facility-administered medications on file prior to visit.     Objective:  Objective  Physical Exam Vitals and nursing note reviewed.  Constitutional:      Appearance: She is well-developed and well-nourished.  HENT:     Head: Normocephalic and atraumatic.  Eyes:     Extraocular Movements: EOM normal.     Conjunctiva/sclera: Conjunctivae normal.  Neck:     Thyroid: No thyromegaly.     Vascular: No carotid bruit or JVD.  Cardiovascular:     Rate and Rhythm: Normal rate and regular rhythm.     Heart sounds: Normal heart sounds. No murmur heard.   Pulmonary:     Effort: Pulmonary effort is normal. No respiratory distress.     Breath sounds: Normal breath sounds. No wheezing or rales.  Chest:     Chest wall: No tenderness.  Musculoskeletal:        General: Swelling present.     Cervical back: Normal range of motion and neck supple.     Right lower leg: 2+ Pitting Edema present.     Left lower leg: 2+ Pitting Edema present.  Neurological:     Mental Status: She is alert and oriented to person, place, and time.  Psychiatric:        Mood and Affect: Mood and affect normal.    BP 134/90 (BP Location: Right Arm, Patient Position: Sitting,  Cuff Size: Large)    Pulse 95    Temp 98 F (36.7 C) (Oral)    Resp 18    Ht 5\' 7"  (1.702 m)    Wt (!) 370 lb 9.6 oz (168.1 kg)    SpO2 98%    BMI 58.04 kg/m  Wt Readings from Last 3 Encounters:  09/06/20 (!) 370 lb 9.6 oz (168.1 kg)  07/08/20 (!) 373 lb 6.4 oz (169.4 kg)  03/17/20 (!) 372 lb (168.7 kg)     Lab Results  Component Value Date   WBC 7.5 03/17/2020   HGB 11.9 (L) 03/17/2020   HCT 38.4 03/17/2020   PLT 233 03/17/2020   GLUCOSE 114 (H) 03/17/2020   CHOL 149 12/01/2019   TRIG 81.0 12/01/2019   HDL 52.10 12/01/2019   LDLCALC 81  12/01/2019   ALT 14 12/01/2019   AST 15 12/01/2019   NA 136 03/17/2020   K 4.2 03/17/2020   CL 104 03/17/2020   CREATININE 0.77 03/17/2020   BUN 13 03/17/2020   CO2 23 03/17/2020   TSH 2.16 12/01/2019   HGBA1C 6.0 07/24/2011    DG Lumbar Spine Complete  Result Date: 07/08/2020 CLINICAL DATA:  Low back and left leg pain for 3 days. EXAM: LUMBAR SPINE - COMPLETE 4+ VIEW COMPARISON:  None. FINDINGS: There are 5 non rib-bearing lumbar type vertebrae. Vertebral alignment is normal. No fracture is identified. There is mild disc space narrowing at L5-S1 with preserved disc space heights elsewhere in the lumbar spine. Mild anterior endplate spurring is noted at L3-4 and at multiple levels in the lower thoracic spine. IMPRESSION: No acute osseous abnormality.  Mild spondylosis. Electronically Signed   By: 07/10/2020 M.D.   On: 07/08/2020 23:56     Assessment & Plan:  Plan  I have discontinued Chala L. Aigner's meclizine, hydrochlorothiazide, and predniSONE. I am also having her start on furosemide, metoprolol succinate, Saxenda, and Insulin Pen Needle. Additionally, I am having her maintain her carbamide peroxide and cyclobenzaprine.  Meds ordered this encounter  Medications   furosemide (LASIX) 20 MG tablet    Sig: Take 1 tablet (20 mg total) by mouth daily.    Dispense:  30 tablet    Refill:  3   metoprolol succinate (TOPROL-XL) 50 MG 24 hr tablet    Sig: Take 1 tablet (50 mg total) by mouth daily. Take with or immediately following a meal.    Dispense:  90 tablet    Refill:  3   Liraglutide -Weight Management (SAXENDA) 18 MG/3ML SOPN    Sig: 3 mg sq qd    Dispense:  3 mL    Refill:  5   Insulin Pen Needle 29G X 12.7MM MISC    Sig: As directed    Dispense:  100 each    Refill:  1    Problem List Items Addressed This Visit      Unprioritized   Chronic pain of both knees    voltaren gel otc Refer ortho       Relevant Orders   Ambulatory referral to Orthopedic Surgery    Lower extremity edema    Lasix 20 mg -- qd-----  Eat/ drink K rich foods Elevate leg --- compression socks       Relevant Medications   furosemide (LASIX) 20 MG tablet   Liraglutide -Weight Management (SAXENDA) 18 MG/3ML SOPN   Other Relevant Orders   ECHOCARDIOGRAM COMPLETE   MORBID OBESITY    saxenda ---  Start  0.6 mg x 1 week then inc 1.2 mg x 1 week etc F/u 3-4 weeks       Relevant Medications   Liraglutide -Weight Management (SAXENDA) 18 MG/3ML SOPN   Insulin Pen Needle 29G X 12.7MM MISC   Other Relevant Orders   Lipid panel   CBC with Differential/Platelet   Comprehensive metabolic panel   Insulin, random   Insulin, random   Palpitation - Primary    ekg -- nsr , no changes Event monitor and echo  Check labs       Relevant Medications   metoprolol succinate (TOPROL-XL) 50 MG 24 hr tablet   Liraglutide -Weight Management (SAXENDA) 18 MG/3ML SOPN   Other Relevant Orders   EKG 12-Lead (Completed)   Lipid panel   CBC with Differential/Platelet   Comprehensive metabolic panel   TSH   ECHOCARDIOGRAM COMPLETE   Cardiac event monitor   Primary hypertension    Start metoprolol daily Dash diet  F/u 3-4 weeks       Relevant Medications   furosemide (LASIX) 20 MG tablet   metoprolol succinate (TOPROL-XL) 50 MG 24 hr tablet   Liraglutide -Weight Management (SAXENDA) 18 MG/3ML SOPN   Other Relevant Orders   ECHOCARDIOGRAM COMPLETE      Follow-up: Return in about 4 weeks (around 10/04/2020), or if symptoms worsen or fail to improve, for hypertension.  Donato Schultz, DO

## 2020-09-06 NOTE — Assessment & Plan Note (Signed)
ekg -- nsr , no changes Event monitor and echo  Check labs

## 2020-09-06 NOTE — Patient Instructions (Signed)

## 2020-09-06 NOTE — Assessment & Plan Note (Signed)
voltaren gel otc Refer ortho

## 2020-09-07 ENCOUNTER — Telehealth: Payer: Self-pay | Admitting: *Deleted

## 2020-09-07 LAB — COMPREHENSIVE METABOLIC PANEL
ALT: 17 U/L (ref 0–35)
AST: 18 U/L (ref 0–37)
Albumin: 3.8 g/dL (ref 3.5–5.2)
Alkaline Phosphatase: 60 U/L (ref 39–117)
BUN: 15 mg/dL (ref 6–23)
CO2: 28 mEq/L (ref 19–32)
Calcium: 8.9 mg/dL (ref 8.4–10.5)
Chloride: 104 mEq/L (ref 96–112)
Creatinine, Ser: 0.87 mg/dL (ref 0.40–1.20)
GFR: 85.42 mL/min (ref >60.00)
Glucose, Bld: 98 mg/dL (ref 70–99)
Potassium: 3.7 mEq/L (ref 3.5–5.1)
Sodium: 139 mEq/L (ref 135–145)
Total Bilirubin: 0.3 mg/dL (ref 0.2–1.2)
Total Protein: 6.6 g/dL (ref 6.0–8.3)

## 2020-09-07 LAB — CBC WITH DIFFERENTIAL/PLATELET
Basophils Absolute: 0 10*3/uL (ref 0.0–0.1)
Basophils Relative: 0.2 % (ref 0.0–3.0)
Eosinophils Absolute: 0.1 10*3/uL (ref 0.0–0.7)
Eosinophils Relative: 1.4 % (ref 0.0–5.0)
HCT: 35.4 % — ABNORMAL LOW (ref 36.0–46.0)
Hemoglobin: 11.4 g/dL — ABNORMAL LOW (ref 12.0–15.0)
Lymphocytes Relative: 46.2 % — ABNORMAL HIGH (ref 12.0–46.0)
Lymphs Abs: 3 10*3/uL (ref 0.7–4.0)
MCHC: 32.2 g/dL (ref 30.0–36.0)
MCV: 73.9 fl — ABNORMAL LOW (ref 78.0–100.0)
Monocytes Absolute: 0.3 10*3/uL (ref 0.1–1.0)
Monocytes Relative: 5.2 % (ref 3.0–12.0)
Neutro Abs: 3 10*3/uL (ref 1.4–7.7)
Neutrophils Relative %: 47 % (ref 43.0–77.0)
Platelets: 185 10*3/uL (ref 150.0–400.0)
RBC: 4.8 Mil/uL (ref 3.87–5.11)
RDW: 16.2 % — ABNORMAL HIGH (ref 11.5–15.5)
WBC: 6.4 10*3/uL (ref 4.0–10.5)

## 2020-09-07 LAB — LIPID PANEL
Cholesterol: 141 mg/dL (ref 0–200)
HDL: 49.5 mg/dL (ref >39.00)
LDL Cholesterol: 71 mg/dL (ref 0–99)
NonHDL: 91.29
Total CHOL/HDL Ratio: 3
Triglycerides: 102 mg/dL (ref 0.0–149.0)
VLDL: 20.4 mg/dL (ref 0.0–40.0)

## 2020-09-07 LAB — TSH: TSH: 1.78 u[IU]/mL (ref 0.35–4.50)

## 2020-09-07 LAB — INSULIN, RANDOM: Insulin: 22 u[IU]/mL — ABNORMAL HIGH

## 2020-09-07 NOTE — Telephone Encounter (Signed)
Lindsey Figueroa is not covered by pts ins.  I tried prior auth anyway and was still denied.

## 2020-09-07 NOTE — Telephone Encounter (Signed)
Key: Lindsey Figueroa was denied   Case number: [id number]

## 2020-09-08 ENCOUNTER — Other Ambulatory Visit: Payer: Self-pay | Admitting: Family Medicine

## 2020-09-08 ENCOUNTER — Other Ambulatory Visit: Payer: Self-pay

## 2020-09-08 DIAGNOSIS — E8881 Metabolic syndrome: Secondary | ICD-10-CM

## 2020-09-08 DIAGNOSIS — R002 Palpitations: Secondary | ICD-10-CM

## 2020-09-08 DIAGNOSIS — I10 Essential (primary) hypertension: Secondary | ICD-10-CM

## 2020-09-08 DIAGNOSIS — R600 Localized edema: Secondary | ICD-10-CM

## 2020-09-08 MED ORDER — VICTOZA 18 MG/3ML ~~LOC~~ SOPN: 1.8000 mg | PEN_INJECTOR | Freq: Every day | SUBCUTANEOUS | 2 refills | Status: DC

## 2020-09-08 MED ORDER — SAXENDA 18 MG/3ML ~~LOC~~ SOPN: PEN_INJECTOR | SUBCUTANEOUS | 5 refills | Status: DC

## 2020-09-08 NOTE — Telephone Encounter (Signed)
Okay to refuse 

## 2020-09-08 NOTE — Telephone Encounter (Signed)
Now insurance is saying she needs to try Metformin first.

## 2020-09-08 NOTE — Telephone Encounter (Signed)
Let pt know ins is refusing until she tries metformin first--- which can also help with weight loss  If she has any trouble with it -- let us know and we can send in victoza again Meformin xr 500 mg #30  2 refills 1 po qd

## 2020-09-08 NOTE — Telephone Encounter (Signed)
Will change to Victoza as instructed per Dr Laury Axon in pts lab results.

## 2020-09-13 ENCOUNTER — Encounter: Payer: Self-pay | Admitting: Family Medicine

## 2020-09-13 NOTE — Telephone Encounter (Signed)
I saw the denial for the Saxenda. Have seen anything for the Victoza?

## 2020-09-17 ENCOUNTER — Other Ambulatory Visit: Payer: Self-pay

## 2020-09-17 DIAGNOSIS — E8881 Metabolic syndrome: Secondary | ICD-10-CM

## 2020-09-17 MED ORDER — METFORMIN HCL ER 500 MG PO TB24: 500.0000 mg | ORAL_TABLET | Freq: Every day | ORAL | 2 refills | Status: DC

## 2020-09-17 NOTE — Telephone Encounter (Signed)
Metformin sent to pharmacy

## 2020-09-20 ENCOUNTER — Ambulatory Visit: Payer: Self-pay

## 2020-09-20 ENCOUNTER — Ambulatory Visit (INDEPENDENT_AMBULATORY_CARE_PROVIDER_SITE_OTHER): Payer: 59 | Admitting: Orthopedic Surgery

## 2020-09-20 DIAGNOSIS — M25561 Pain in right knee: Secondary | ICD-10-CM

## 2020-09-20 DIAGNOSIS — G8929 Other chronic pain: Secondary | ICD-10-CM | POA: Diagnosis not present

## 2020-09-20 DIAGNOSIS — M25562 Pain in left knee: Secondary | ICD-10-CM | POA: Diagnosis not present

## 2020-09-22 ENCOUNTER — Other Ambulatory Visit: Payer: Self-pay

## 2020-09-22 ENCOUNTER — Telehealth: Payer: Self-pay

## 2020-09-22 NOTE — Telephone Encounter (Signed)
PA started and sent to plan.  Key: [id number]

## 2020-09-23 NOTE — Telephone Encounter (Signed)
PA already cx since pt was starting Metformin first. Med change not needed just yet.

## 2020-09-23 NOTE — Telephone Encounter (Signed)
We could inc the dose of metformin if she would like to try that ?

## 2020-09-23 NOTE — Telephone Encounter (Signed)
PA denied. Reference #: [id number]  Reason: Medicine is only covered with a diagnosis of type 2 DM.

## 2020-10-04 ENCOUNTER — Ambulatory Visit (HOSPITAL_BASED_OUTPATIENT_CLINIC_OR_DEPARTMENT_OTHER): Payer: 59 | Attending: Family Medicine

## 2020-10-05 ENCOUNTER — Encounter: Payer: Self-pay | Admitting: Orthopedic Surgery

## 2020-10-05 NOTE — Progress Notes (Signed)
Office Visit Note   Patient: Lindsey Figueroa           Date of Birth: Nov 22, 1985           MRN: [id number] Visit Date: 09/20/2020              Requested by: [address], [place], [place] [address] [place],  [place] [address] PCP: Zola Button, Grayling Congress, DO  Chief Complaint  Patient presents with  . Right Knee - Pain  . Left Knee - Pain      HPI: Patient is a 35 year old woman who presents complaining of patellofemoral pain in both knees she states she has popping and cracking and decreased range of motion.  She describes swelling and giving way of her knee denies any falls.  Assessment & Plan: Visit Diagnoses:  1. Chronic pain of both knees     Plan: Patient was given instructions and demonstrated quad isometric straight leg raises both knees were injected with steroid she tolerated this well follow-up as needed  Follow-Up Instructions: No follow-ups on file.   Ortho Exam  Patient is alert, oriented, no adenopathy, well-dressed, normal affect, normal respiratory effort. On examination patient has crepitation in the patellofemoral joint with range of motion she is tender to palpation of the medial lateral facets of the patella.  There is minimal effusion collaterals and cruciates are stable no redness no cellulitis.  Imaging: No results found. No images are attached to the encounter.  Labs: Lab Results  Component Value Date   HGBA1C 6.0 07/24/2011   LABORGA Multiple bacterial morphotypes present, none 12/10/2013   LABORGA predominant. Suggest appropriate recollection if  12/10/2013   LABORGA clinically indicated. 12/10/2013     Lab Results  Component Value Date   ALBUMIN 3.8 09/06/2020   ALBUMIN 3.7 12/01/2019   ALBUMIN 3.9 02/22/2016    No results found for: MG No results found for: VD25OH  No results found for: PREALBUMIN CBC EXTENDED Latest Ref Rng & Units 09/06/2020 03/17/2020 12/01/2019  WBC 4.0 - 10.5 K/uL 6.4 7.5 5.5  RBC 3.87 - 5.11  Mil/uL 4.80 5.00 4.68  HGB 12.0 - 15.0 g/dL 11.4(L) 11.9(L) 11.3(L)  HCT 36.0 - 46.0 % 35.4(L) 38.4 35.1(L)  PLT 150.0 - 400.0 K/uL 185.0 233 208.0  NEUTROABS 1.4 - 7.7 K/uL 3.0 - 2.1  LYMPHSABS 0.7 - 4.0 K/uL 3.0 - 3.0     There is no height or weight on file to calculate BMI.  Orders:  Orders Placed This Encounter  Procedures  . XR Knee 1-2 Views Right  . XR Knee 1-2 Views Left   No orders of the defined types were placed in this encounter.    Procedures: Large Joint Inj: bilateral knee on 10/05/2020 9:12 AM Indications: pain and diagnostic evaluation Details: 22 G 1.5 in needle, anteromedial approach  Arthrogram: No  Outcome: tolerated well, no immediate complications Procedure, treatment alternatives, risks and benefits explained, specific risks discussed. Consent was given by the patient. Immediately prior to procedure a time out was called to verify the correct patient, procedure, equipment, support staff and site/side marked as required. Patient was prepped and draped in the usual sterile fashion.      Clinical Data: No additional findings.  ROS:  All other systems negative, except as noted in the HPI. Review of Systems  Objective: Vital Signs: There were no vitals taken for this visit.  Specialty Comments:  No specialty comments available.  PMFS History: Patient  Active Problem List   Diagnosis Date Noted  . Primary hypertension 09/06/2020  . Chronic pain of both knees 09/06/2020  . Elevated BP without diagnosis of hypertension 07/15/2020  . Low back pain with radiation 07/08/2020  . Lower extremity edema 07/08/2020  . Preventative health care 12/21/2017  . Morbid obesity with BMI of 50.0-59.9, adult (HCC) 09/22/2013  . Vaginal discharge 05/20/2012  . UTI symptoms 05/20/2012  . Contraception management 04/03/2012  . MORBID OBESITY 06/23/2010  . HIP PAIN, LEFT 05/20/2010  . Palpitation 05/20/2010   Past Medical History:  Diagnosis Date  . STD  (sexually transmitted disease)    Chlamydia    Family History  Problem Relation Age of Onset  . Diabetes Mother   . Cancer - Lung Maternal Grandfather        Non smoker  . Hypertension Maternal Grandmother     Past Surgical History:  Procedure Laterality Date  . TONSILLECTOMY AND ADENOIDECTOMY    . WISDOM TOOTH EXTRACTION     Social History   Occupational History  . Occupation: style team lead    Employer: [employer]  Tobacco Use  . Smoking status: Never Smoker  . Smokeless tobacco: Never Used  Vaping Use  . Vaping Use: Never used  Substance and Sexual Activity  . Alcohol use: No  . Drug use: No  . Sexual activity: Yes    Partners: Male    Birth control/protection: Condom    Comment: 1st intercourse 35 yo-More than 5 partners

## 2020-10-12 ENCOUNTER — Ambulatory Visit (INDEPENDENT_AMBULATORY_CARE_PROVIDER_SITE_OTHER): Payer: 59 | Admitting: Family Medicine

## 2020-10-12 ENCOUNTER — Encounter (INDEPENDENT_AMBULATORY_CARE_PROVIDER_SITE_OTHER): Payer: Self-pay | Admitting: Family Medicine

## 2020-10-12 ENCOUNTER — Other Ambulatory Visit: Payer: Self-pay

## 2020-10-12 VITALS — BP 130/79 | HR 71 | Temp 98.0°F | Ht 67.0 in | Wt 373.0 lb

## 2020-10-12 DIAGNOSIS — I10 Essential (primary) hypertension: Secondary | ICD-10-CM

## 2020-10-12 DIAGNOSIS — D508 Other iron deficiency anemias: Secondary | ICD-10-CM

## 2020-10-12 DIAGNOSIS — Z9189 Other specified personal risk factors, not elsewhere classified: Secondary | ICD-10-CM | POA: Diagnosis not present

## 2020-10-12 DIAGNOSIS — R0602 Shortness of breath: Secondary | ICD-10-CM

## 2020-10-12 DIAGNOSIS — Z1331 Encounter for screening for depression: Secondary | ICD-10-CM

## 2020-10-12 DIAGNOSIS — R5383 Other fatigue: Secondary | ICD-10-CM

## 2020-10-12 DIAGNOSIS — Z6841 Body Mass Index (BMI) 40.0 and over, adult: Secondary | ICD-10-CM

## 2020-10-12 DIAGNOSIS — E8881 Metabolic syndrome: Secondary | ICD-10-CM

## 2020-10-12 DIAGNOSIS — Z0289 Encounter for other administrative examinations: Secondary | ICD-10-CM

## 2020-10-12 NOTE — Progress Notes (Signed)
Office: [phone]  /  Fax: [phone]    Date: October 18, 2020   Appointment Start Time: 9:02am Duration: 43 minutes Provider: Lawerance Cruel, Psy.D. Type of Session: Intake for Individual Therapy  Location of Patient: Home Location of Provider: Provider's Home (private office) Type of Contact: Telepsychological Visit via MyChart Video Visit  Informed Consent: Prior to proceeding with today's appointment, two pieces of identifying information were obtained. In addition, Lindsey Figueroa's physical location at the time of this appointment was obtained as well a phone number she could be reached at in the event of technical difficulties. Lindsey Figueroa and this provider participated in today's telepsychological service.   The provider's role was explained to Lindsey Figueroa. The provider reviewed and discussed issues of confidentiality, privacy, and limits therein (e.g., reporting obligations). In addition to verbal informed consent, written informed consent for psychological services was obtained prior to the initial appointment. Since the clinic is not a 24/7 crisis center, mental health emergency resources were shared and this  provider explained MyChart, e-mail, voicemail, and/or other messaging systems should be utilized only for non-emergency reasons. This provider also explained that information obtained during appointments will be placed in [name]'s medical record and relevant information will be shared with other providers at [facility] for coordination of care. Lindsey Figueroa agreed information may be shared with other [facility] providers as needed for coordination of care and by signing the service agreement document, she provided written consent for coordination of care. Prior to initiating telepsychological services, Lindsey Figueroa completed an informed consent document, which included the development of a safety plan (i.e., an emergency contact and emergency resources) in the event of  an emergency/crisis. Lindsey Figueroa expressed understanding of the rationale of the safety plan. Lindsey Figueroa verbally acknowledged understanding she is ultimately responsible for understanding her insurance benefits for telepsychological and in-person services. This provider also reviewed confidentiality, as it relates to telepsychological services, as well as the rationale for telepsychological services (i.e., to reduce exposure risk to COVID-19). Lindsey Figueroa  acknowledged understanding that appointments cannot be recorded without both party consent and she is aware she is responsible for securing confidentiality on her end of the session. Lindsey Figueroa verbally consented to proceed.  Chief Complaint/HPI: Lindsey Figueroa was referred by Dr. Quillian Quince during their initial appointment on October 12, 2020. Lindsey Figueroa's Food and Mood (modified PHQ-9) score on October 12, 2020 was 22.  During today's appointment, Lindsey Figueroa was verbally administered a questionnaire assessing various behaviors related to emotional eating. Lindsey Figueroa endorsed the following: overeat when you are celebrating, experience food cravings on a regular basis, eat certain foods when you are anxious, stressed, depressed, or your feelings are hurt, use food to help you cope with emotional situations, find food is comforting to you, overeat when you are angry or upset, overeat when you are worried about something, overeat frequently when you are bored or lonely, not worry about what you eat when you are in a good mood, overeat when you are angry at someone just to show them they cannot control you, overeat when you are alone, but eat much less when you are with other people and eat as a reward. She shared she craves sweets (e.g., cookies, cake, chocolate). Lindsey Figueroa believes the onset of emotional eating was likely in childhood, noting her grandmother was a "big" cook. She described the current frequency of emotional eating as "three times a week, maybe." In addition, Lindsey Figueroa reported a history  of engaging in binge eating behaviors when celebrating, noting she will keep  eating even if she is full as "it made [her] feel really good." She reported she last engaged in binge eating behaviors around the holidays. Lindsey Figueroa also discussed a history of not eating all day and then eating large amounts at the end of the day, noting the frequency of the aforementioned as "twice a week" prior to starting with the clinic. Lindsey Figueroa discussed a history of detox for weights loss, but denied laxative use, purging, and significant restriction of food intake. She denied ever being diagnosed with an eating disorder or treated for emotional eating. Currently, Lindsey Figueroa indicated stress related to residing with her mother and work and feeling sad about her living situation triggers emotional eating, whereas sketching/drawing and starting with the clinic makes emotional eating better. Regarding the prescribed structured meal plan, she discussed having some challenges due to feeling restricted.    Mental Status Examination:  Appearance: well groomed and appropriate hygiene  Behavior: appropriate to circumstances Mood: sad Affect: mood congruent Speech: normal in rate, volume, and tone Eye Contact: appropriate Psychomotor Activity: unable to assess  Gait: unable to assess Thought Process: linear, logical, and goal directed  Thought Content/Perception: denies suicidal and homicidal ideation, plan, and intent and no hallucinations, delusions, bizarre thinking or behavior reported or observed Orientation: time, person, place, and purpose of appointment Memory/Concentration: memory, attention, language, and fund of knowledge intact  Insight/Judgment: good  Family & Psychosocial History: Lindsey Figueroa reported she is not in a relationship and she does not have any children. 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 Additionally, Lindsey Figueroa shared her highest level of education obtained is a bachelor degree in  fine arts. Currently, Navya's social support system consists of her mother, sister, best friend, and Acupuncturist. Moreover, Camie stated she resides with her mother, sister, and three year old niece.   Medical History:  Past Medical History:  Diagnosis Date  . Back pain   . Bilateral swelling of feet and ankles   . Depression   . Edema   . GERD (gastroesophageal reflux disease)   . Hypertension   . Joint pain   . Lactose intolerance   . Other fatigue   . Prediabetes   . Shortness of breath on exertion   . STD (sexually transmitted disease)    Chlamydia   Past Surgical History:  Procedure Laterality Date  . TONSILLECTOMY AND ADENOIDECTOMY    . WISDOM TOOTH EXTRACTION     Current Outpatient Medications on File Prior to Visit  Medication Sig Dispense Refill  . furosemide (LASIX) 20 MG tablet Take 1 tablet (20 mg total) by mouth daily. 30 tablet 3  . metFORMIN (GLUCOPHAGE XR) 500 MG 24 hr tablet Take 1 tablet (500 mg total) by mouth daily with breakfast. 30 tablet 2  . metoprolol succinate (TOPROL-XL) 50 MG 24 hr tablet Take 1 tablet (50 mg total) by mouth daily. Take with or immediately following a meal. 90 tablet 3   No current facility-administered medications on file prior to visit.   Mental Health History: Shanena reported she attended therapeutic services starting in May or June 2020, noting their last appointment was in December. Lindsey Figueroa explained she sought services due to "emotional things in [her] relationship." She indicated she can reach out to her therapist as needed. She denied a history of psychotropic medication and hospitalizations for psychiatric concerns. Lindsey Figueroa reported her maternal grandmother suffers from alcoholism. Lindsey Figueroa reported there is no history of trauma including psychological, physical  and sexual abuse, as well  as neglect. However, she noted she has "abandonment issues" related to her father.   Lindsey Figueroa described her typical mood lately as "sad" due to  "going through so many changes (e.g., work challenges, eating changes, recently ending a relationship)." Aside from concerns noted above and endorsed on the PHQ-9 and GAD-7, Lindsey Figueroa reported experiencing crying spells; decreased motivation due to weight gain; self-deprecating thoughts (exacerbated with recent weight gain); occasional social withdrawal due to weight gain; and ongoing worry thoughts (e.g., well-being of family members; finances; work tasks; eating changes and desire for results). She discussed praying helps her cope. Lindsey Figueroa endorsed infrequent alcohol use, noting it is typically once every two months in the form of a standard pour of wine. She denied tobacco use. She denied illicit/recreational substance use. Regarding caffeine intake, Khrystal reported consuming coffee twice a week. Furthermore, Lindsey Figueroa indicated she is not experiencing the following: hallucinations and delusions, paranoia, symptoms of mania , social withdrawal and panic attacks. She also denied history of and current suicidal ideation, plan, and intent; history of and current homicidal ideation, plan, and intent; and history of and current engagement in self-harm. Notably, Lindsey Figueroa endorsed item 9 (i.e., "Do you feel that your weight problem is so hopeless that sometimes life doesn't seem worth living?") on the modified PHQ-9 during her initial appointment with Dr. Quillian Quincearen Beasley on October 12, 2020. Lindsey Figueroa clarified she endorsed the item due to feeling stuck about her weight and desire to give up trying to lose weight. She denied endorsing the item due to suicidal ideation.   Legal History: Lindsey Figueroa reported there is no history of legal involvement.   Structured Assessments Results: The Patient Health Questionnaire-9 (PHQ-9) is a self-report measure that assesses symptoms and severity of depression over the course of the last two weeks. Garnett obtained a score of 10 suggesting moderate depression. Lindsey Figueroa finds the endorsed symptoms to be  somewhat difficult. [0= Not at all; 1= Several days; 2= More than half the days; 3= Nearly every day] Little interest or pleasure in doing things 3  Feeling down, depressed, or hopeless 1  Trouble falling or staying asleep, or sleeping too much 3  Feeling tired or having little energy 1  Poor appetite or overeating 0  Feeling bad about yourself --- or that you are a failure or have let yourself or your family down 1  Trouble concentrating on things, such as reading the newspaper or watching television 1  Moving or speaking so slowly that other people could have noticed? Or the opposite --- being so fidgety or restless that you have been moving around a lot more than usual 0  Thoughts that you would be better off dead or hurting yourself in some way 0  PHQ-9 Score 10    The Generalized Anxiety Disorder-7 (GAD-7) is a brief self-report measure that assesses symptoms of anxiety over the course of the last two weeks. Alauna obtained a score of 21 suggesting severe anxiety. Lindsey Figueroa finds the endorsed symptoms to be somewhat difficult. [0= Not at all; 1= Several days; 2= Over half the days; 3= Nearly every day] Feeling nervous, anxious, on edge 3  Not being able to stop or control worrying 3  Worrying too much about different things 3  Trouble relaxing 3  Being so restless that it's hard to sit still 3  Becoming easily annoyed or irritable 3  Feeling afraid as if something awful might happen 3  GAD-7 Score 21   Interventions:  Conducted a chart review Focused on rapport  building Verbally administered PHQ-9 and GAD-7 for symptom monitoring Verbally administered Food & Mood questionnaire to assess various behaviors related to emotional eating Provided emphatic reflections and validation Collaborated with patient on a treatment goal  Psychoeducation provided regarding physical versus emotional hunger  Provisional DSM-5 Diagnosis(es): 311 (F32.8) Other Specified Depressive Disorder, Emotional  Eating Behaviors and 300.00 (F41.9) Unspecified Anxiety Disorder  Plan: Madelena appears able and willing to participate as evidenced by collaboration on a treatment goal, engagement in reciprocal conversation, and asking questions as needed for clarification. The next appointment will be scheduled in two weeks, which will be via MyChart Video Visit. The following treatment goal was established: increasing coping skills. She was receptive to re-initiating therapeutic services with her prior therapist if deemed necessary. This provider will regularly review the treatment plan and medical chart to keep informed of status changes. Tanga expressed understanding and agreement with the initial treatment plan of care. Jessie will be sent a handout via e-mail to utilize between now and the next appointment to increase awareness of hunger patterns and subsequent eating. Anastyn provided verbal consent during today's appointment for this provider to send the handout via e-mail.

## 2020-10-13 LAB — COMPREHENSIVE METABOLIC PANEL
ALT: 18 IU/L (ref 0–32)
AST: 13 IU/L (ref 0–40)
Albumin/Globulin Ratio: 1.4 (ref 1.2–2.2)
Albumin: 3.7 g/dL — ABNORMAL LOW (ref 3.8–4.8)
Alkaline Phosphatase: 70 IU/L (ref 44–121)
BUN/Creatinine Ratio: 13 (ref 9–23)
BUN: 9 mg/dL (ref 6–20)
Bilirubin Total: 0.2 mg/dL (ref 0.0–1.2)
CO2: 21 mmol/L (ref 20–29)
Calcium: 8.5 mg/dL — ABNORMAL LOW (ref 8.7–10.2)
Chloride: 107 mmol/L — ABNORMAL HIGH (ref 96–106)
Creatinine, Ser: 0.69 mg/dL (ref 0.57–1.00)
Globulin, Total: 2.6 g/dL (ref 1.5–4.5)
Glucose: 99 mg/dL (ref 65–99)
Potassium: 4.8 mmol/L (ref 3.5–5.2)
Sodium: 143 mmol/L (ref 134–144)
Total Protein: 6.3 g/dL (ref 6.0–8.5)
eGFR: 115 mL/min/{1.73_m2} (ref >59)

## 2020-10-13 LAB — INSULIN, RANDOM: INSULIN: 14.9 u[IU]/mL (ref 2.6–24.9)

## 2020-10-18 ENCOUNTER — Telehealth (INDEPENDENT_AMBULATORY_CARE_PROVIDER_SITE_OTHER): Payer: 59 | Admitting: Psychology

## 2020-10-18 DIAGNOSIS — F419 Anxiety disorder, unspecified: Secondary | ICD-10-CM

## 2020-10-18 DIAGNOSIS — F3289 Other specified depressive episodes: Secondary | ICD-10-CM | POA: Diagnosis not present

## 2020-10-18 NOTE — Progress Notes (Signed)
  Office: [phone]  /  Fax: [phone]    Date: November 01, 2020   Appointment Start Time: 8:30am Duration: 22 minutes Provider: Lawerance Cruel, Psy.D. Type of Session: Individual Therapy  Location of Patient: Home Location of Provider: Provider's Home (private office) Type of Contact: Telepsychological Visit via MyChart Video Visit  Session Content: Lindsey Figueroa is a 35 y.o. female presenting for a follow-up appointment to address the previously established treatment goal of increasing coping skills. Today's appointment was a telepsychological visit due to COVID-19. Lindsey Figueroa provided verbal consent for today's telepsychological appointment and she is aware she is responsible for securing confidentiality on her end of the session. Prior to proceeding with today's appointment, Lindsey Figueroa's physical location at the time of this appointment was obtained as well a phone number she could be reached at in the event of technical difficulties. Lindsey Figueroa and this provider participated in today's telepsychological service.   This provider conducted a brief check-in. Lindsey Figueroa shared about her recent visit with Dr. Dalbert Garnet, noting she lost 10 pounds. She also discussed making better choices and engaging in portion control during the Easter holiday. Positive reinforcement was provided. Notably, she discussed possibly not eating enough during the day on Easter due to worry about overeating or deviating from the meal plan. Associated thoughts and feelings were processed. Additionally, she was encouraged to eat congruent to the meal plan leading up to the meal in the future and focusing on protein/vegetable intake based on what is available. She agreed.   Lindsey Figueroa described experiencing cravings at time. Emotional and physical hunger were reviewed. Psychoeducation regarding triggers for emotional eating was provided. Lindsey Figueroa was provided a handout, and encouraged to utilize the handout between now and the next appointment to  increase awareness of triggers and frequency. Lindsey Figueroa agreed. This provider also discussed behavioral strategies for specific triggers, such as placing the utensil down when conversing to avoid mindless eating. Lindsey Figueroa provided verbal consent during today's appointment for this provider to send a handout about triggers via e-mail. Lindsey Figueroa was receptive to today's appointment as evidenced by openness to sharing, responsiveness to feedback, and willingness to explore triggers for emotional eating.  Mental Status Examination:  Appearance: well groomed and appropriate hygiene  Behavior: appropriate to circumstances Mood: euthymic Affect: mood congruent Speech: normal in rate, volume, and tone Eye Contact: appropriate Psychomotor Activity: unable to assess  Gait: unable to assess Thought Process: linear, logical, and goal directed  Thought Content/Perception: no hallucinations, delusions, bizarre thinking or behavior reported or observed and no evidence or endorsement of suicidal and homicidal ideation, plan, and intent Orientation: time, person, place, and purpose of appointment Memory/Concentration: memory, attention, language, and fund of knowledge intact  Insight/Judgment: good  Interventions:  Conducted a brief chart review Provided empathic reflections and validation Reviewed content from the previous session Provided positive reinforcement Employed supportive psychotherapy interventions to facilitate reduced distress and to improve coping skills with identified stressors Psychoeducation provided regarding triggers for emotional eating  DSM-5 Diagnosis(es): 311 (F32.8) Other Specified Depressive Disorder, Emotional Eating Behaviors and 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 Lindsey Figueroa has demonstrated some progress in her goal as evidenced by  increased awareness of hunger patterns.   Plan: The next appointment will be scheduled in two weeks, which will be via MyChart Video Visit. The next session will focus on working towards the established treatment goal.

## 2020-10-20 NOTE — Progress Notes (Signed)
Chief Complaint:   OBESITY Lindsey Figueroa (MR# [id number]) is a 35 y.o. female who presents for evaluation and treatment of obesity and related comorbidities. Current BMI is Body mass index is 58.42 kg/m. Lindsey Figueroa has been struggling with her weight for many years and has been unsuccessful in either losing weight, maintaining weight loss, or reaching her healthy weight goal.  Lindsey Figueroa is currently in the action stage of change and ready to dedicate time achieving and maintaining a healthier weight. Lindsey Figueroa is interested in becoming our patient and working on intensive lifestyle modifications including (but not limited to) diet and exercise for weight loss.  Lindsey Figueroa's habits were reviewed today and are as follows: Her family eats meals together, she thinks her family will eat healthier with her, she struggles with family and or coworkers weight loss sabotage, her desired weight loss is 173 lbs, she has been heavy most of her life, she started gaining weight at age 47, her heaviest weight ever was 376 pounds, she has significant food cravings issues, she skips meals frequently, she is frequently drinking liquids with calories, she frequently makes poor food choices, she has problems with excessive hunger, she frequently eats larger portions than normal and she struggles with emotional eating.  Depression Screen Lindsey Figueroa's Food and Mood (modified PHQ-9) score was 22.  Depression screen [facility] 2/9 10/12/2020  Decreased Interest 3  Down, Depressed, Hopeless 3  PHQ - 2 Score 6  Altered sleeping 3  Tired, decreased energy 3  Change in appetite 3  Feeling bad or failure about yourself  3  Trouble concentrating 3  Moving slowly or fidgety/restless 0  Suicidal thoughts 1  PHQ-9 Score 22  Difficult doing work/chores Somewhat difficult   Subjective:   1. Other fatigue Lindsey Figueroa admits to daytime somnolence and admits to waking up still tired. Patent has a history of symptoms of daytime fatigue and  morning headache. Lindsey Figueroa generally gets 5 hours of sleep per night, and states that she has nightime awakenings. Snoring is present. Apneic episodes are present. Epworth Sleepiness Score is 6.  2. Shortness of breath on exertion Jahnay notes increasing shortness of breath with exercising and seems to be worsening over time with weight gain. She notes getting out of breath sooner with activity than she used to. This has not gotten worse recently. Fidelis denies shortness of breath at rest or orthopnea.  3. Insulin resistance Blessed has a history of insulin resistance, and notes polyphagia. She notes some signs of hypoglycemia.  4. Primary hypertension Lindsey Figueroa's blood pressure is stable on her medications. She is working on diet and weight loss.  5. Other iron deficiency anemia Lindsey Figueroa has a history of low hemoglobin. She denies heavy menses and she is not on iron supplements.   6. At risk for diabetes mellitus Lindsey Figueroa is at higher than average risk for developing diabetes due to obesity.   Assessment/Plan:   1. Other fatigue Lindsey Figueroa does feel that her weight is causing her energy to be lower than it should be. Fatigue may be related to obesity, depression or many other causes. Labs will be ordered, and in the meanwhile, Sumie will focus on self care including making healthy food choices, increasing physical activity and focusing on stress reduction.  - Vitamin B12 - CBC with Differential/Platelet - Folate - Lipid Panel With LDL/HDL Ratio - T3 - T4 - VITAMIN D 25 Hydroxy (Vit-D Deficiency, Fractures)  2. Shortness of breath on exertion Lindsey Figueroa does feel that she gets out  of breath more easily that she used to when she exercises. Hailie's shortness of breath appears to be obesity related and exercise induced. She has agreed to work on weight loss and gradually increase exercise to treat her exercise induced shortness of breath. Will continue to monitor closely.  3. Insulin resistance Chassie  will start her Category 3 plan, and will continue to work on weight loss, exercise, and decreasing simple carbohydrates to help decrease the risk of diabetes. We will check labs today. Lindsey Figueroa agreed to follow-up with [place] as directed to closely monitor her progress.  - Hemoglobin A1c - Insulin, random - Comprehensive metabolic panel  4. Primary hypertension Lindsey Figueroa will start her Category 3 plan, and will continue working on healthy weight loss and exercise to improve blood pressure control. We will check labs today, and we will watch for signs of hypotension as she continues her lifestyle modifications.  - Comprehensive metabolic panel  5. Other iron deficiency anemia Lindsey Figueroa will start an iron rich diet and we will recheck labs in 3 months. Orders and follow up as documented in patient record.  6. Screening for depression Lindsey Figueroa had a positive depression screening. Depression is commonly associated with obesity and often results in emotional eating behaviors. We will monitor this closely and work on CBT to help improve the non-hunger eating patterns. Referral to Psychology may be required if no improvement is seen as she continues in our clinic.  7. At risk for diabetes mellitus Lindsey Figueroa was given approximately 30 minutes of diabetes education and counseling today. We discussed intensive lifestyle modifications today with an emphasis on weight loss as well as increasing exercise and decreasing simple carbohydrates in her diet. We also reviewed medication options with an emphasis on risk versus benefit of those discussed.   Repetitive spaced learning was employed today to elicit superior memory formation and behavioral change.  8. Class 3 severe obesity with serious comorbidity and body mass index (BMI) of 50.0 to 59.9 in adult, unspecified obesity type (HCC) Lindsey Figueroa is currently in the action stage of change and her goal is to continue with weight loss efforts. I recommend Hesper begin the  structured treatment plan as follows:  She has agreed to the Category 3 Plan.  Exercise goals: No exercise has been prescribed for now, while we concentrate on nutritional changes.  Behavioral modification strategies: increasing lean protein intake and no skipping meals.  She was informed of the importance of frequent follow-up visits to maximize her success with intensive lifestyle modifications for her multiple health conditions. She was informed we would discuss her lab results at her next visit unless there is a critical issue that needs to be addressed sooner. Lindsey Figueroa agreed to keep her next visit at the agreed upon time to discuss these results.  Objective:   Blood pressure 130/79, pulse 71, temperature 98 F (36.7 C), height 5\' 7"  (1.702 m), weight (!) 373 lb (169.2 kg), SpO2 99 %. Body mass index is 58.42 kg/m.  EKG: Normal sinus rhythm, rate 91 BPM.  Indirect Calorimeter completed today shows a VO2 of 355 and a REE of 2470.  Her calculated basal metabolic rate is thus her basal metabolic rate is better than expected.  General: Cooperative, alert, well developed, in no acute distress. HEENT: Conjunctivae and lids unremarkable. Cardiovascular: Regular rhythm.  Lungs: Normal work of breathing. Neurologic: No focal deficits.   Lab Results  Component Value Date   CREATININE 0.69 10/12/2020   BUN 9 10/12/2020   NA 143  10/12/2020   K 4.8 10/12/2020   CL 107 (H) 10/12/2020   CO2 21 10/12/2020   Lab Results  Component Value Date   ALT 18 10/12/2020   AST 13 10/12/2020   ALKPHOS 70 10/12/2020   BILITOT 0.2 10/12/2020   Lab Results  Component Value Date   HGBA1C 6.0 07/24/2011   Lab Results  Component Value Date   INSULIN 14.9 10/12/2020   Lab Results  Component Value Date   TSH 1.78 09/06/2020   Lab Results  Component Value Date   CHOL 141 09/06/2020   HDL 49.50 09/06/2020   LDLCALC 71 09/06/2020   TRIG 102.0 09/06/2020   CHOLHDL 3 09/06/2020   Lab  Results  Component Value Date   WBC 6.4 09/06/2020   HGB 11.4 (L) 09/06/2020   HCT 35.4 (L) 09/06/2020   MCV 73.9 (L) 09/06/2020   PLT 185.0 09/06/2020   No results found for: IRON, TIBC, FERRITIN  Attestation Statements:   Reviewed by clinician on day of visit: allergies, medications, problem list, medical history, surgical history, family history, social history, and previous encounter notes.   I, Burt Knack, am acting as transcriptionist for Quillian Quince, MD.  I have reviewed the above documentation for accuracy and completeness, and I agree with the above. - Quillian Quince, MD

## 2020-10-26 ENCOUNTER — Other Ambulatory Visit: Payer: Self-pay

## 2020-10-26 ENCOUNTER — Ambulatory Visit (INDEPENDENT_AMBULATORY_CARE_PROVIDER_SITE_OTHER): Payer: 59 | Admitting: Family Medicine

## 2020-10-26 VITALS — BP 146/78 | HR 63 | Temp 98.0°F | Ht 67.0 in | Wt 363.0 lb

## 2020-10-26 DIAGNOSIS — Z9189 Other specified personal risk factors, not elsewhere classified: Secondary | ICD-10-CM

## 2020-10-26 DIAGNOSIS — E8881 Metabolic syndrome: Secondary | ICD-10-CM

## 2020-10-26 DIAGNOSIS — Z6841 Body Mass Index (BMI) 40.0 and over, adult: Secondary | ICD-10-CM

## 2020-10-26 DIAGNOSIS — I10 Essential (primary) hypertension: Secondary | ICD-10-CM

## 2020-11-01 ENCOUNTER — Telehealth (INDEPENDENT_AMBULATORY_CARE_PROVIDER_SITE_OTHER): Payer: 59 | Admitting: Psychology

## 2020-11-01 DIAGNOSIS — F3289 Other specified depressive episodes: Secondary | ICD-10-CM

## 2020-11-01 DIAGNOSIS — F419 Anxiety disorder, unspecified: Secondary | ICD-10-CM

## 2020-11-01 NOTE — Progress Notes (Signed)
  Office: [phone]  /  Fax: [phone]    Date: Nov 15, 2020   Appointment Start Time: 8:00am Duration: 24 minutes Provider: Lawerance Cruel, Psy.D. Type of Session: Individual Therapy  Location of Patient: Home Location of Provider: Provider's Home (private office) Type of Contact: Telepsychological Visit via MyChart Video Visit  Session Content: Lindsey Figueroa is a 35 y.o. female presenting for a follow-up appointment to address the previously established treatment goal of increasing coping skills. Today's appointment was a telepsychological visit due to COVID-19. Lindsey Figueroa provided verbal consent for today's telepsychological appointment and she is aware she is responsible for securing confidentiality on her end of the session. Prior to proceeding with today's appointment, Lindsey Figueroa's physical location at the time of this appointment was obtained as well a phone number she could be reached at in the event of technical difficulties. Lindsey Figueroa and this provider participated in today's telepsychological service. With Lindsey Figueroa's verbal consent at 8:02pm, today's appointment was switched to a regular telephone call due to technical issues.  This provider conducted a brief check-in. Lindsey Figueroa shared she feels "disappointed" with her weight loss progress, noting she has not been feeling well (GI issues; headache) and is experiencing decreased appetite. She stated she has likely been sick for about a week; she was encouraged to reach out to her PCP. Associated thoughts and feelings were processed. She described engaging in all or nothing thinking, which was reflected to her. Psychoeducation provided regarding radical acceptance to assist with coping. Coping involving the senses was discussed. She was receptive as evidenced by her brainstorming different pleasurable activities for each sense. Overall, Lindsey Figueroa was receptive to today's appointment as evidenced by openness to sharing, responsiveness to feedback, and willingness  to implement discussed strategies.    Mental Status Examination:  Appearance: well groomed and appropriate hygiene  Behavior: appropriate to circumstances Mood: "disappointed" Affect: mood congruent Speech: normal in rate, volume, and tone Eye Contact: appropriate Psychomotor Activity: unable to assess  Gait: unable to assess Thought Process: linear, logical, and goal directed  Thought Content/Perception: no hallucinations, delusions, bizarre thinking or behavior reported or observed and no evidence or endorsement of suicidal and homicidal ideation, plan, and intent Orientation: time, person, place, and purpose of appointment Memory/Concentration: memory, attention, language, and fund of knowledge intact  Insight/Judgment: good  Interventions:  Conducted a brief chart review Provided empathic reflections and validation Employed supportive psychotherapy interventions to facilitate reduced distress and to improve coping skills with identified stressors Psychoeducation provided regarding radical acceptance  DSM-5 Diagnosis(es): 311 (F32.8) Other Specified Depressive Disorder, Emotional Eating Behaviors and 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 Lindsey Figueroa has demonstrated progress in her goal as evidenced by increased awareness of hunger patterns and increased awareness of triggers for emotional eating. Lindsey Figueroa also continues to demonstrate willingness to engage in learned skill(s).  Plan: The next appointment will be scheduled in two weeks, which will be via MyChart Video Visit. The next session will focus on working towards the established treatment goal.

## 2020-11-03 NOTE — Progress Notes (Signed)
Chief Complaint:   OBESITY [name] is here to discuss her progress with her obesity treatment plan along with follow-up of her obesity related diagnoses. Lindsey Figueroa is on the Category 3 Plan and states she is following her eating plan approximately 99% of the time. Lindsey Figueroa states she is doing 0 minutes 0 times per week.  Today's visit was #: 2 Starting weight: 373 lbs Starting date: 10/12/2020 Today's weight: 363 lbs Today's date: 10/26/2020 Total lbs lost to date: 10 Total lbs lost since last in-office visit: 10  Interim History: Lindsey Figueroa has done exceptionally well with her weight loss. She notes her hunger was mostly controlled and she did well with meal planning and prepping.  Subjective:   1. Insulin resistance Lindsey Figueroa's fasting insulin is elevated. She is working on diet and weight loss, and she notes polyphagia is already improving. I discussed labs with the patient today.  2. Essential hypertension Lindsey Figueroa's blood pressure is elevated today. She is on metoprolol and she has done very well with weight loss. She denies chest pain or headache.  3. At risk for impaired metabolic function Lindsey Figueroa is at increased risk for impaired metabolic function due to decreased protein intake.  Assessment/Plan:   1. Insulin resistance Lindsey Figueroa will continue to work on weight loss, diet, exercise, and decreasing simple carbohydrates to help decrease the risk of diabetes. Lindsey Figueroa will continue metformin as is, and will continue to follow-up with Lindsey Figueroa as directed to closely monitor her progress.  2. Essential hypertension Cathey will continue her diet and medications to improve blood pressure control. We will recheck her blood pressure in 2 weeks.  3. At risk for impaired metabolic function Lindsey Figueroa was given approximately 30 minutes of impaired  metabolic function prevention counseling today. We discussed intensive lifestyle modifications today with an emphasis on specific nutrition and exercise  instructions and strategies.   Repetitive spaced learning was employed today to elicit superior memory formation and behavioral change.  4. Obesity with current BMI of 56.9 Lindsey Figueroa is currently in the action stage of change. As such, her goal is to continue with weight loss efforts. She has agreed to the Category 3 Plan.   Behavioral modification strategies: increasing lean protein intake and meal planning and cooking strategies.  Lindsey Figueroa has agreed to follow-up with our clinic in 2 weeks. She was informed of the importance of frequent follow-up visits to maximize her success with intensive lifestyle modifications for her multiple health conditions.   Objective:   Blood pressure (!) 146/78, pulse 63, temperature 98 F (36.7 C), height 5\' 7"  (1.702 m), weight (!) 363 lb (164.7 kg), SpO2 100 %. Body mass index is 56.85 kg/m.  General: Cooperative, alert, well developed, in no acute distress. HEENT: Conjunctivae and lids unremarkable. Cardiovascular: Regular rhythm.  Lungs: Normal work of breathing. Neurologic: No focal deficits.   Lab Results  Component Value Date   CREATININE 0.69 10/12/2020   BUN 9 10/12/2020   NA 143 10/12/2020   K 4.8 10/12/2020   CL 107 (H) 10/12/2020   CO2 21 10/12/2020   Lab Results  Component Value Date   ALT 18 10/12/2020   AST 13 10/12/2020   ALKPHOS 70 10/12/2020   BILITOT 0.2 10/12/2020   Lab Results  Component Value Date   HGBA1C 6.0 07/24/2011   Lab Results  Component Value Date   INSULIN 14.9 10/12/2020   Lab Results  Component Value Date   TSH 1.78 09/06/2020   Lab Results  Component Value Date  CHOL 141 09/06/2020   HDL 49.50 09/06/2020   LDLCALC 71 09/06/2020   TRIG 102.0 09/06/2020   CHOLHDL 3 09/06/2020   Lab Results  Component Value Date   WBC 6.4 09/06/2020   HGB 11.4 (L) 09/06/2020   HCT 35.4 (L) 09/06/2020   MCV 73.9 (L) 09/06/2020   PLT 185.0 09/06/2020   No results found for: IRON, TIBC,  FERRITIN  Attestation Statements:   Reviewed by clinician on day of visit: allergies, medications, problem list, medical history, surgical history, family history, social history, and previous encounter notes.    I, Burt Knack, am acting as transcriptionist for Quillian Quince, MD.  I have reviewed the above documentation for accuracy and completeness, and I agree with the above. -  Quillian Quince, MD

## 2020-11-09 ENCOUNTER — Encounter (INDEPENDENT_AMBULATORY_CARE_PROVIDER_SITE_OTHER): Payer: Self-pay | Admitting: Family Medicine

## 2020-11-15 ENCOUNTER — Telehealth (INDEPENDENT_AMBULATORY_CARE_PROVIDER_SITE_OTHER): Payer: 59 | Admitting: Psychology

## 2020-11-15 DIAGNOSIS — F419 Anxiety disorder, unspecified: Secondary | ICD-10-CM | POA: Diagnosis not present

## 2020-11-15 DIAGNOSIS — F3289 Other specified depressive episodes: Secondary | ICD-10-CM

## 2020-11-15 NOTE — Progress Notes (Signed)
  Office: [phone]  /  Fax: [phone]    Date: Nov 29, 2020   Appointment Start Time: 9:01am Duration: 25 minutes Provider: Lawerance Cruel, Psy.D. Type of Session: Individual Therapy  Location of Patient: Home Location of Provider: Provider's Home (private office) Type of Contact: Telepsychological Visit via MyChart Video Visit  Session Content: Kiyra is a 35 y.o. female presenting for a follow-up appointment to address the previously established treatment goal of increasing coping skills. Today's appointment was a telepsychological visit due to COVID-19. Jillaine provided verbal consent for today's telepsychological appointment and she is aware she is responsible for securing confidentiality on her end of the session. Prior to proceeding with today's appointment, Calissa's physical location at the time of this appointment was obtained as well a phone number she could be reached at in the event of technical difficulties. Torii and this provider participated in today's telepsychological service.   This provider conducted a brief check-in. Daisey shared she is feeling better, noting her doctor stated she is likely dehydrated. She reported she continues to be more mindful of her water intake. Brina further shared, "Eating is going really well." She also discussed an overall reduction in emotional eating. Due to upcoming celebrations, psychoeducation regarding making better choices and engaging in portion control during holidays/celebrations/travel was provided. More specifically, this provider discussed the following strategies: coming to meals hungry, but not starving; avoid filling up on appetizers; managing portion sizes; not completely depriving yourself; making the plate colorful (e.g., vegetables); pacing yourself (e.g., waiting 10 minutes before going back for seconds); taking advantage of the nutritious foods; practicing mindfulness; staying hydrated; and avoid bringing home leftovers.  Furthermore, termination planning was discussed. Hanh was receptive to a follow-up appointment in 3-4 weeks and an additional follow-up/termination appointment in 3-4 weeks after that.  Overall, Carsyn was receptive to today's appointment as evidenced by openness to sharing, responsiveness to feedback, and willingness to implement discussed strategies .  Mental Status Examination:  Appearance: well groomed and appropriate hygiene  Behavior: appropriate to circumstances Mood: euthymic Affect: mood congruent Speech: normal in rate, volume, and tone Eye Contact: appropriate Psychomotor Activity: unable to assess  Gait: unable to assess Thought Process: linear, logical, and goal directed  Thought Content/Perception: no hallucinations, delusions, bizarre thinking or behavior reported or observed and no evidence or endorsement of suicidal and homicidal ideation, plan, and intent Orientation: time, person, place, and purpose of appointment Memory/Concentration: memory, attention, language, and fund of knowledge intact  Insight/Judgment: good  Interventions:  Conducted a brief chart review Provided empathic reflections and validation Employed supportive psychotherapy interventions to facilitate reduced distress and to improve coping skills with identified stressors Discussed termination planning Psychoeducation regarding strategies for travel/holidays/vacations  DSM-5 Diagnosis(es): F50.89 Other Specified Feeding or Eating Disorder, Emotional Eating Behaviors and F41.9 Unspecified Anxiety Disorder  Treatment Goal & Progress: During the initial appointment with this provider, the following treatment goal was established: increase coping skills. Loan has demonstrated progress in her goal as evidenced by increased awareness of hunger patterns, increased awareness of triggers for emotional eating and reduction in emotional eating. Julio also continues to demonstrate willingness to engage in  learned skill(s).  Plan: The next appointment will be scheduled in 3-4 weeks, which will be via MyChart Video Visit. The next session will focus on working towards the established treatment goal.

## 2020-11-22 ENCOUNTER — Encounter (INDEPENDENT_AMBULATORY_CARE_PROVIDER_SITE_OTHER): Payer: Self-pay | Admitting: Physician Assistant

## 2020-11-22 ENCOUNTER — Other Ambulatory Visit: Payer: Self-pay

## 2020-11-22 ENCOUNTER — Ambulatory Visit (INDEPENDENT_AMBULATORY_CARE_PROVIDER_SITE_OTHER): Payer: 59 | Admitting: Physician Assistant

## 2020-11-22 VITALS — BP 137/77 | HR 74 | Temp 97.8°F | Ht 67.0 in | Wt 355.0 lb

## 2020-11-22 DIAGNOSIS — Z6841 Body Mass Index (BMI) 40.0 and over, adult: Secondary | ICD-10-CM | POA: Diagnosis not present

## 2020-11-22 DIAGNOSIS — E8881 Metabolic syndrome: Secondary | ICD-10-CM | POA: Diagnosis not present

## 2020-11-22 NOTE — Progress Notes (Signed)
Chief Complaint:   OBESITY Mckensey is here to discuss her progress with her obesity treatment plan along with follow-up of her obesity related diagnoses. Cyniah is on the Category 3 Plan and states she is following her eating plan approximately 85% of the time. Dyan states she is doing 0 minutes 0 times per week.  Today's visit was #: 3 Starting weight: 373 lbs Starting date: 10/12/2020 Today's weight: 355 lbs Today's date: 11/22/2020 Total lbs lost to date: 18 Total lbs lost since last in-office visit: 8  Interim History: Normajean did well with weight loss. She has not felt well over the last 2 weeks, but she forced herself to eat on the plan. She is not drinking enough water.  Subjective:   1. Insulin resistance Elia is on metformin daily, and she is tolerating it well.  Assessment/Plan:   1. Insulin resistance Mela will continue metformin, and will continue to work on weight loss, exercise, and decreasing simple carbohydrates to help decrease the risk of diabetes. Ashely agreed to follow-up with [place] as directed to closely monitor her progress.  2. Morbid obesity with BMI of 50.0-59.9, adult (HCC) [place] is currently in the action stage of change. As such, her goal is to continue with weight loss efforts. She has agreed to the Category 3 Plan and keeping a food journal and adhering to recommended goals of 450-600 calories and 40 grams of protein at supper daily.   Exercise goals: No exercise has been prescribed at this time.  Behavioral modification strategies: increasing water intake, meal planning and cooking strategies and keeping healthy foods in the home.  Trisha has agreed to follow-up with our clinic in 2 weeks. She was informed of the importance of frequent follow-up visits to maximize her success with intensive lifestyle modifications for her multiple health conditions.   Objective:   Blood pressure 137/77, pulse 74, temperature 97.8 F (36.6 C), height 5\' 7"   (1.702 m), weight (!) 355 lb (161 kg), SpO2 99 %. Body mass index is 55.6 kg/m.  General: Cooperative, alert, well developed, in no acute distress. HEENT: Conjunctivae and lids unremarkable. Cardiovascular: Regular rhythm.  Lungs: Normal work of breathing. Neurologic: No focal deficits.   Lab Results  Component Value Date   CREATININE 0.69 10/12/2020   BUN 9 10/12/2020   NA 143 10/12/2020   K 4.8 10/12/2020   CL 107 (H) 10/12/2020   CO2 21 10/12/2020   Lab Results  Component Value Date   ALT 18 10/12/2020   AST 13 10/12/2020   ALKPHOS 70 10/12/2020   BILITOT 0.2 10/12/2020   Lab Results  Component Value Date   HGBA1C 6.0 07/24/2011   Lab Results  Component Value Date   INSULIN 14.9 10/12/2020   Lab Results  Component Value Date   TSH 1.78 09/06/2020   Lab Results  Component Value Date   CHOL 141 09/06/2020   HDL 49.50 09/06/2020   LDLCALC 71 09/06/2020   TRIG 102.0 09/06/2020   CHOLHDL 3 09/06/2020   Lab Results  Component Value Date   WBC 6.4 09/06/2020   HGB 11.4 (L) 09/06/2020   HCT 35.4 (L) 09/06/2020   MCV 73.9 (L) 09/06/2020   PLT 185.0 09/06/2020   No results found for: IRON, TIBC, FERRITIN  Attestation Statements:   Reviewed by clinician on day of visit: allergies, medications, problem list, medical history, surgical history, family history, social history, and previous encounter notes.  Time spent on visit including pre-visit chart review and  post-visit care and charting was 48 minutes.    Trude Mcburney, am acting as transcriptionist for [employer], PA-C.  I have reviewed the above documentation for accuracy and completeness, and I agree with the above. Alois Cliche, PA-C

## 2020-11-29 ENCOUNTER — Telehealth (INDEPENDENT_AMBULATORY_CARE_PROVIDER_SITE_OTHER): Payer: 59 | Admitting: Psychology

## 2020-11-29 DIAGNOSIS — F3289 Other specified depressive episodes: Secondary | ICD-10-CM | POA: Diagnosis not present

## 2020-11-29 DIAGNOSIS — F419 Anxiety disorder, unspecified: Secondary | ICD-10-CM | POA: Diagnosis not present

## 2020-12-06 ENCOUNTER — Encounter (INDEPENDENT_AMBULATORY_CARE_PROVIDER_SITE_OTHER): Payer: Self-pay | Admitting: Family Medicine

## 2020-12-06 ENCOUNTER — Ambulatory Visit (INDEPENDENT_AMBULATORY_CARE_PROVIDER_SITE_OTHER): Payer: 59 | Admitting: Family Medicine

## 2020-12-06 ENCOUNTER — Other Ambulatory Visit: Payer: Self-pay

## 2020-12-06 VITALS — BP 119/82 | HR 66 | Temp 97.9°F | Ht 67.0 in | Wt 353.0 lb

## 2020-12-06 DIAGNOSIS — E8881 Metabolic syndrome: Secondary | ICD-10-CM

## 2020-12-06 DIAGNOSIS — Z6841 Body Mass Index (BMI) 40.0 and over, adult: Secondary | ICD-10-CM

## 2020-12-06 DIAGNOSIS — E88819 Insulin resistance, unspecified: Secondary | ICD-10-CM

## 2020-12-07 DIAGNOSIS — E8881 Metabolic syndrome: Secondary | ICD-10-CM | POA: Insufficient documentation

## 2020-12-07 DIAGNOSIS — E88819 Insulin resistance, unspecified: Secondary | ICD-10-CM | POA: Insufficient documentation

## 2020-12-07 NOTE — Progress Notes (Signed)
Chief Complaint:   OBESITY Jena is here to discuss her progress with her obesity treatment plan along with follow-up of her obesity related diagnoses. Kami is on the Category 3 Plan and keeping a food journal and adhering to recommended goals of 450-600 calories and 40 grams of protein at supper daily and states she is following her eating plan approximately 90% of the time. Berlene states she is doing 0 minutes 0 times per week.  Today's visit was #: 4 Starting weight: 373 lbs Starting date: 10/12/2020 Today's weight: 353 lbs Today's date: 12/06/2020 Total lbs lost to date: 20 Total lbs lost since last in-office visit: 2  Interim History: Sacha continues to do well with weight loss. She is doing well with journaling for dinner. She is doing well with thinking ahead and doing damage control when she eats out.  Subjective:   1. Insulin resistance Kamarah continues to do well with diet and weight loss. She has been holding her metformin due to it decreasing her appetite enough that she wasn't able to eat everything on her plan. She notes decreased polyphagia.  Assessment/Plan:   1. Insulin resistance [place] agreed to hold metformin, and she will continue to work on diet and exercise, and decreasing simple carbohydrates to help decrease the risk of diabetes. Ladell agreed to follow-up with [place] as directed to closely monitor her progress.  2. Obesity with current BMI 55.3 Jori is currently in the action stage of change. As such, her goal is to continue with weight loss efforts. She has agreed to the Category 3 Plan and keeping a food journal and adhering to recommended goals of 450-600 calories and 40+ grams of protein at supper daily.   Behavioral modification strategies: increasing lean protein intake, better snacking choices and celebration eating strategies.  Cyriah has agreed to follow-up with our clinic in 2 weeks. She was informed of the importance of frequent follow-up  visits to maximize her success with intensive lifestyle modifications for her multiple health conditions.   Objective:   Blood pressure 119/82, pulse 66, temperature 97.9 F (36.6 C), height 5\' 7"  (1.702 m), weight (!) 353 lb (160.1 kg), SpO2 99 %. Body mass index is 55.29 kg/m.  General: Cooperative, alert, well developed, in no acute distress. HEENT: Conjunctivae and lids unremarkable. Cardiovascular: Regular rhythm.  Lungs: Normal work of breathing. Neurologic: No focal deficits.   Lab Results  Component Value Date   CREATININE 0.69 10/12/2020   BUN 9 10/12/2020   NA 143 10/12/2020   K 4.8 10/12/2020   CL 107 (H) 10/12/2020   CO2 21 10/12/2020   Lab Results  Component Value Date   ALT 18 10/12/2020   AST 13 10/12/2020   ALKPHOS 70 10/12/2020   BILITOT 0.2 10/12/2020   Lab Results  Component Value Date   HGBA1C 6.0 07/24/2011   Lab Results  Component Value Date   INSULIN 14.9 10/12/2020   Lab Results  Component Value Date   TSH 1.78 09/06/2020   Lab Results  Component Value Date   CHOL 141 09/06/2020   HDL 49.50 09/06/2020   LDLCALC 71 09/06/2020   TRIG 102.0 09/06/2020   CHOLHDL 3 09/06/2020   Lab Results  Component Value Date   WBC 6.4 09/06/2020   HGB 11.4 (L) 09/06/2020   HCT 35.4 (L) 09/06/2020   MCV 73.9 (L) 09/06/2020   PLT 185.0 09/06/2020   No results found for: IRON, TIBC, FERRITIN  Attestation Statements:   Reviewed by  clinician on day of visit: allergies, medications, problem list, medical history, surgical history, family history, social history, and previous encounter notes.  Time spent on visit including pre-visit chart review and post-visit care and charting was 45 minutes.    I, Burt Knack, am acting as transcriptionist for Quillian Quince, MD.  I have reviewed the above documentation for accuracy and completeness, and I agree with the above. -  Quillian Quince, MD

## 2020-12-14 NOTE — Progress Notes (Signed)
Office: [phone]  /  Fax: [phone]    Date: December 27, 2020   Appointment Start Time: 8:29am Duration: 24 minutes Provider: Lawerance Cruel, Psy.D. Type of Session: Individual Therapy  Location of Patient: Home Location of Provider: Provider's home (private office) Type of Contact: Telepsychological Visit via MyChart Video Visit  Session Content: Lindsey Figueroa is a 35 y.o. female presenting for a follow-up appointment to address the previously established treatment goal of increasing coping skills. Today's appointment was a telepsychological visit due to COVID-19. Lindsey Figueroa provided verbal consent for today's telepsychological appointment and she is aware she is responsible for securing confidentiality on her end of the session. Prior to proceeding with today's appointment, Lindsey Figueroa's physical location at the time of this appointment was obtained as well a phone number she could be reached at in the event of technical difficulties. Lindsey Figueroa and this provider participated in today's telepsychological service. Due to a poor connection, [name] provided verbal consent to switch to a regular telephone call at 8:31am.   This provider conducted a brief check-in and verbally administered the PHQ-9 and GAD-7. Lindsey Figueroa shared about recent celebrations, noting she engaged in portion control and made better choices. Positive reinforcement was provided. Reduction in PHQ-9 and GAD-7 scores were reflected. She discussed feeling "excited" and feeling she is "on the right track." Moreover, Lindsey Figueroa discussed incorporating physical activity, but noted feeling "nervous" due to a prior experience with exercise. This was further explored and processed. Psychoeducation provided regarding grounding techniques and she was engaged in an exercise (5-4-3-2-1). She was receptive as evidenced by her stated, "I really like that."  Lindsey Figueroa provided verbal consent during today's appointment for this provider to send the handout for today's  exercise via e-mail. Overall, Lindsey Figueroa was receptive to today's appointment as evidenced by openness to sharing, responsiveness to feedback, and willingness to implement discussed strategies .  Mental Status Examination:  Appearance: well groomed and appropriate hygiene  Behavior: appropriate to circumstances Mood: euthymic Affect: mood congruent Speech: normal in rate, volume, and tone Eye Contact: appropriate Psychomotor Activity: unable to assess  Gait: unable to assess Thought Process: linear, logical, and goal directed  Thought Content/Perception: no hallucinations, delusions, bizarre thinking or behavior reported or observed and no evidence or endorsement of suicidal and homicidal ideation, plan, and intent Orientation: time, person, place, and purpose of appointment Memory/Concentration: memory, attention, language, and fund of knowledge intact  Insight/Judgment: good  Structured Assessments Results: The Patient Health Questionnaire-9 (PHQ-9) is a self-report measure that assesses symptoms and severity of depression over the course of the last two weeks. Lindsey Figueroa obtained a score of 1 suggesting minimal depression. Lindsey Figueroa finds the endorsed symptoms to be somewhat difficult. [0= Not at all; 1= Several days; 2= More than half the days; 3= Nearly every day] Little interest or pleasure in doing things 0  Feeling down, depressed, or hopeless 0  Trouble falling or staying asleep, or sleeping too much 0  Feeling tired or having little energy 1  Poor appetite or overeating 0  Feeling bad about yourself --- or that you are a failure or have let yourself or your family down 0  Trouble concentrating on things, such as reading the newspaper or watching television 0  Moving or speaking so slowly that other people could have noticed? Or the opposite --- being so fidgety or restless that you have been moving around a lot more than usual 0  Thoughts that you would be better off dead or hurting  yourself in some way 0  PHQ-9 Score 1    The Generalized Anxiety Disorder-7 (GAD-7) is a brief self-report measure that assesses symptoms of anxiety over the course of the last two weeks. Lindsey Figueroa obtained a score of 4 suggesting minimal anxiety. Lindsey Figueroa finds the endorsed symptoms to be somewhat difficult. [0= Not at all; 1= Several days; 2= Over half the days; 3= Nearly every day] Feeling nervous, anxious, on edge 0  Not being able to stop or control worrying 1  Worrying too much about different things 1  Trouble relaxing 1  Being so restless that it's hard to sit still 0  Becoming easily annoyed or irritable 0  Feeling afraid as if something awful might happen-  "Failing at this life style." 1  GAD-7 Score 4   Interventions:  Conducted a brief chart review Verbally administered PHQ-9 and GAD-7 for symptom monitoring Provided empathic reflections and validation Provided positive reinforcement Employed supportive psychotherapy interventions to facilitate reduced distress and to improve coping skills with identified stressors Psychoeducation provided regarding grounding techniques Engaged patient in a grounding exercise  DSM-5 Diagnosis(es): F32.89 Other Specified Depressive Disorder, Emotional Eating Behaviors and F41.9 Unspecified Anxiety Disorder  Treatment Goal & Progress: During the initial appointment with this provider, the following treatment goal was established: increase coping skills. Lindsey Figueroa has demonstrated progress in her goal as evidenced by increased awareness of hunger patterns, increased awareness of triggers for emotional eating, and reduction in emotional eating. Lindsey Figueroa also continues to demonstrate willingness to engage in learned skill(s).  Plan: The next appointment will be scheduled in one month, which will be via MyChart Video Visit. The next session will focus on working towards the established treatment goal and termination.

## 2020-12-20 ENCOUNTER — Ambulatory Visit (INDEPENDENT_AMBULATORY_CARE_PROVIDER_SITE_OTHER): Payer: 59 | Admitting: Physician Assistant

## 2020-12-20 ENCOUNTER — Other Ambulatory Visit: Payer: Self-pay

## 2020-12-20 ENCOUNTER — Encounter (INDEPENDENT_AMBULATORY_CARE_PROVIDER_SITE_OTHER): Payer: Self-pay | Admitting: Physician Assistant

## 2020-12-20 VITALS — BP 134/84 | Ht 67.0 in | Wt 348.0 lb

## 2020-12-20 DIAGNOSIS — E8881 Metabolic syndrome: Secondary | ICD-10-CM

## 2020-12-20 DIAGNOSIS — Z6841 Body Mass Index (BMI) 40.0 and over, adult: Secondary | ICD-10-CM

## 2020-12-22 NOTE — Progress Notes (Signed)
Chief Complaint:   OBESITY Yalanda is here to discuss her progress with her obesity treatment plan along with follow-up of her obesity related diagnoses. Dajuana is on the Category 3 Plan and keeping a food journal and adhering to recommended goals of 450-600 calories and 40+ grams of protein at supper daily and states she is following her eating plan approximately 92% of the time. Shakura states she is doing 0 minutes 0 times per week.  Today's visit was #: 5 Starting weight: 373 lbs Starting date: 10/12/2020 Today's weight: 348 lbs Today's date: 12/20/2020 Total lbs lost to date: 25 Total lbs lost since last in-office visit: 5  Interim History: Lakasha reports that she has been excessively hungry over the last few weeks. She is averaging 1300 calories and 90 grams of protein daily when she is journaling. She notes metformin helps decrease her hunger but she is more hungry than normal.  Subjective:   1. Insulin resistance Alissia is on metformin, and it helps decrease her appetite. She stopped it for a few days and noticed a drastic increase in her appetite, so she restarted it.  Assessment/Plan:   1. Insulin resistance Mahkayla will continue with metformin, and continue to work on weight loss, exercise, and decreasing simple carbohydrates to help decrease the risk of diabetes. Sindhu agreed to follow-up with [place] as directed to closely monitor her progress.  2. Morbid obesity with BMI of 50.0-59.9, adult (HCC) Margeaux is currently in the action stage of change. As such, her goal is to continue with weight loss efforts. She has agreed to the Category 3 Plan.   Exercise goals: Detailed strength training was reviewed today.  Behavioral modification strategies: meal planning and cooking strategies and keeping healthy foods in the home.  Anistyn has agreed to follow-up with our clinic in 3 weeks. She was informed of the importance of frequent follow-up visits to maximize her success with  intensive lifestyle modifications for her multiple health conditions.   Objective:   Blood pressure 134/84, height 5\' 7"  (1.702 m), weight (!) 348 lb (157.9 kg). Body mass index is 54.5 kg/m.  General: Cooperative, alert, well developed, in no acute distress. HEENT: Conjunctivae and lids unremarkable. Cardiovascular: Regular rhythm.  Lungs: Normal work of breathing. Neurologic: No focal deficits.   Lab Results  Component Value Date   CREATININE 0.69 10/12/2020   BUN 9 10/12/2020   NA 143 10/12/2020   K 4.8 10/12/2020   CL 107 (H) 10/12/2020   CO2 21 10/12/2020   Lab Results  Component Value Date   ALT 18 10/12/2020   AST 13 10/12/2020   ALKPHOS 70 10/12/2020   BILITOT 0.2 10/12/2020   Lab Results  Component Value Date   HGBA1C 6.0 07/24/2011   Lab Results  Component Value Date   INSULIN 14.9 10/12/2020   Lab Results  Component Value Date   TSH 1.78 09/06/2020   Lab Results  Component Value Date   CHOL 141 09/06/2020   HDL 49.50 09/06/2020   LDLCALC 71 09/06/2020   TRIG 102.0 09/06/2020   CHOLHDL 3 09/06/2020   Lab Results  Component Value Date   WBC 6.4 09/06/2020   HGB 11.4 (L) 09/06/2020   HCT 35.4 (L) 09/06/2020   MCV 73.9 (L) 09/06/2020   PLT 185.0 09/06/2020   No results found for: IRON, TIBC, FERRITIN  Attestation Statements:   Reviewed by clinician on day of visit: allergies, medications, problem list, medical history, surgical history, family history, social history,  and previous encounter notes.  Time spent on visit including pre-visit chart review and post-visit care and charting was 45 minutes.    Trude Mcburney, am acting as transcriptionist for [employer], PA-C.  I have reviewed the above documentation for accuracy and completeness, and I agree with the above. Alois Cliche, PA-C

## 2020-12-27 ENCOUNTER — Telehealth (INDEPENDENT_AMBULATORY_CARE_PROVIDER_SITE_OTHER): Payer: 59 | Admitting: Psychology

## 2020-12-27 DIAGNOSIS — F419 Anxiety disorder, unspecified: Secondary | ICD-10-CM | POA: Diagnosis not present

## 2020-12-27 DIAGNOSIS — F3289 Other specified depressive episodes: Secondary | ICD-10-CM

## 2021-01-10 NOTE — Progress Notes (Signed)
Office: [phone]  /  Fax: [phone]    Date: January 24, 2021   Appointment Start Time: 8:30am Duration: 27 minutes Provider: Lawerance Cruel, Psy.D. Type of Session: Individual Therapy  Location of Patient: Home Location of Provider: Provider's home (private office) Type of Contact: Telepsychological Visit via MyChart Video Visit  Session Content: Lindsey Figueroa is a 35 y.o. female presenting for a follow-up appointment to address the previously established treatment goal of increasing coping skills. Today's appointment was a telepsychological visit due to COVID-19. Lindsey Figueroa provided verbal consent for today's telepsychological appointment and she is aware she is responsible for securing confidentiality on her end of the session. Prior to proceeding with today's appointment, Lindsey Figueroa's physical location at the time of this appointment was obtained as well a phone number she could be reached at in the event of technical difficulties. Lindsey Figueroa and this provider participated in today's telepsychological service.   This provider conducted a brief check-in. Lindsey Figueroa shared she was "disappointed" with her progress with weight loss, noting she was informed she gained 5 pounds in water weight. This was further explored and processed. Despite the challenges, Lindsey Figueroa indicated she continued to follow her prescribed meal plan. Positive reinforcement was provided. Lindsey Figueroa expressed an interest in weight loss surgery. Thus, session focused further on discussing bariatric surgery. She stated she has discussed the process of surgery with a friend who had bariatric surgery. This provider also shared about bariatric surgery support groups offered by [facility] and the benefit of supportive therapeutic services. Lindsey Figueroa provided verbal consent for this provider to e-mail links for the support groups as well as referral options.   Administered PHQ-9 and GAD-7 for symptom monitoring. Changes in scores were reflected. Lindsey Figueroa stated,  "I've definitely noticed it. I notice it every day." Progress to date was discussed/reflected. Lindsey Figueroa reported a significant reduction in emotional eating behaviors. Aside from the number on the scale that has changed since starting with the clinic, she observed the following changes: overall attitude and behavior; improvement in work life; improvement in social life; reduction in need to take medications; improved self-image; ability to dress with more ease; and increase ease in engaging in self-care. Positive reinforcement was provided and she was encouraged to write down the aforementioned for future reference. She was observed writing and noted a plan to place her list on her mirror so that she can see it daily. Overall, Lindsey Figueroa was receptive to today's appointment as evidenced by openness to sharing, responsiveness to feedback, and willingness to continue engaging in learned skills.  Mental Status Examination:  Appearance: well groomed and appropriate hygiene  Behavior: appropriate to circumstances Mood: euthymic Affect: mood congruent Speech: normal in rate, volume, and tone Eye Contact: appropriate Psychomotor Activity: unable to assess Gait: unable to assess Thought Process: linear, logical, and goal directed  Thought Content/Perception: no hallucinations, delusions, bizarre thinking or behavior reported or observed and no evidence or endorsement of suicidal and homicidal ideation, plan, and intent Orientation: time, person, place, and purpose of appointment Memory/Concentration: memory, attention, language, and fund of knowledge intact  Insight/Judgment: good  Structured Assessments Results: The Patient Health Questionnaire-9 (PHQ-9) is a self-report measure that assesses symptoms and severity of depression over the course of the last two weeks. Lindsey Figueroa obtained a score of 0. [0= Not at all; 1= Several days; 2= More than half the days; 3= Nearly every day] Little interest or pleasure in  doing things 0  Feeling down, depressed, or hopeless 0  Trouble falling or staying asleep, or sleeping  too much 0  Feeling tired or having little energy 0  Poor appetite or overeating 0  Feeling bad about yourself --- or that you are a failure or have let yourself or your family down 0  Trouble concentrating on things, such as reading the newspaper or watching television 0  Moving or speaking so slowly that other people could have noticed? Or the opposite --- being so fidgety or restless that you have been moving around a lot more than usual 0  Thoughts that you would be better off dead or hurting yourself in some way 0  PHQ-9 Score 0    The Generalized Anxiety Disorder-7 (GAD-7) is a brief self-report measure that assesses symptoms of anxiety over the course of the last two weeks. Lindsey Figueroa obtained a score of 3 suggesting minimal anxiety. Lindsey Figueroa finds the endorsed symptoms to be not difficult at all. [0= Not at all; 1= Several days; 2= Over half the days; 3= Nearly every day] Feeling nervous, anxious, on edge 1  Not being able to stop or control worrying 1  Worrying too much about different things 1  Trouble relaxing 0  Being so restless that it's hard to sit still 0  Becoming easily annoyed or irritable 0  Feeling afraid as if something awful might happen 0  GAD-7 Score 3   Interventions:  Conducted a brief chart review Verbal administration of PHQ-9 and GAD-7 for symptom monitoring Provided empathic reflections and validation Processed thoughts and feelings Provided positive reinforcement Employed supportive psychotherapy interventions to facilitate reduced distress, and to improve coping skills with identified stressors Psychoeducation provided regarding healthy weight loss   DSM-5 Diagnosis(es): F32.89 Other Specified Depressive Disorder, Emotional Eating Behaviors and F41.9 Unspecified Anxiety Disorder  Treatment Goal & Progress: During the initial appointment with this provider,  the following treatment goal was established: increase coping skills. Madelaine demonstrated progress in her goal as evidenced by increased awareness of hunger patterns, increased awareness of triggers for emotional eating behaviors, and reduction in emotional eating behaviors . Dashae also continues to demonstrate willingness to engage in learned skill(s).  Plan: As previously planned, today was Lisanne's last appointment with this provider. She acknowledged understanding that she may request a follow-up appointment with this provider in the future as long as she is still established with the clinic. No further follow-up planned by this provider.

## 2021-01-11 ENCOUNTER — Ambulatory Visit (INDEPENDENT_AMBULATORY_CARE_PROVIDER_SITE_OTHER): Payer: 59 | Admitting: Physician Assistant

## 2021-01-11 ENCOUNTER — Other Ambulatory Visit: Payer: Self-pay

## 2021-01-11 ENCOUNTER — Encounter (INDEPENDENT_AMBULATORY_CARE_PROVIDER_SITE_OTHER): Payer: Self-pay | Admitting: Physician Assistant

## 2021-01-11 VITALS — BP 159/92 | HR 72 | Temp 97.8°F | Ht 67.0 in | Wt 348.0 lb

## 2021-01-11 DIAGNOSIS — E8881 Metabolic syndrome: Secondary | ICD-10-CM

## 2021-01-11 DIAGNOSIS — Z6841 Body Mass Index (BMI) 40.0 and over, adult: Secondary | ICD-10-CM

## 2021-01-13 NOTE — Progress Notes (Signed)
Chief Complaint:   OBESITY Lindsey Figueroa is here to discuss her progress with her obesity treatment plan along with follow-up of her obesity related diagnoses. Lindsey Figueroa is on the Category 3 Plan and states she is following her eating plan approximately 90% of the time. Lindsey Figueroa states she is doing strength and cardio, and YouTube exerise for 30 minutes 2-3 times per week.  Today's visit was #: 6 Starting weight: 373 lbs Starting date: 10/12/2020 Today's weight: 348 lbs Today's date: 01/11/2021 Total lbs lost to date: 25 Total lbs lost since last in-office visit: 0  Interim History: Lindsey Figueroa reports that her hunger is controlled on metformin XR. She is eating close to 1500 calories and getting around 90 grams of protein daily. She is disappointed with her lack of weight loss. GLP-1 options were discussed today.  Subjective:   1. Insulin resistance Lindsey Figueroa is on metformin XR, and she denies polyphagia. She is exercising 2-3 times per week.  Assessment/Plan:   1. Insulin resistance Lindsey Figueroa will continue her medications and meal plan. She will continue to work on weight loss, increasing exercise, and decreasing simple carbohydrates to help decrease the risk of diabetes. Lindsey Figueroa agreed to follow-up with [place] as directed to closely monitor her progress.  2. Morbid obesity with BMI of 50.0-59.9, adult (HCC) Lindsey Figueroa is currently in the action stage of change. As such, her goal is to continue with weight loss efforts. She has agreed to keeping a food journal and adhering to recommended goals of 1700-1800 calories and 115 grams of protein daily.   Exercise goals: As is. Add water aerobics.  Behavioral modification strategies: meal planning and cooking strategies and keeping healthy foods in the home.  Lindsey Figueroa has agreed to follow-up with our clinic in 2 weeks. She was informed of the importance of frequent follow-up visits to maximize her success with intensive lifestyle modifications for her multiple health  conditions.   Objective:   Blood pressure (!) 159/92, pulse 72, temperature 97.8 F (36.6 C), height 5\' 7"  (1.702 m), weight (!) 348 lb (157.9 kg), last menstrual period 12/21/2020, SpO2 100 %. Body mass index is 54.5 kg/m.  General: Cooperative, alert, well developed, in no acute distress. HEENT: Conjunctivae and lids unremarkable. Cardiovascular: Regular rhythm.  Lungs: Normal work of breathing. Neurologic: No focal deficits.   Lab Results  Component Value Date   CREATININE 0.69 10/12/2020   BUN 9 10/12/2020   NA 143 10/12/2020   K 4.8 10/12/2020   CL 107 (H) 10/12/2020   CO2 21 10/12/2020   Lab Results  Component Value Date   ALT 18 10/12/2020   AST 13 10/12/2020   ALKPHOS 70 10/12/2020   BILITOT 0.2 10/12/2020   Lab Results  Component Value Date   HGBA1C 6.0 07/24/2011   Lab Results  Component Value Date   INSULIN 14.9 10/12/2020   Lab Results  Component Value Date   TSH 1.78 09/06/2020   Lab Results  Component Value Date   CHOL 141 09/06/2020   HDL 49.50 09/06/2020   LDLCALC 71 09/06/2020   TRIG 102.0 09/06/2020   CHOLHDL 3 09/06/2020   No results found for: VD25OH Lab Results  Component Value Date   WBC 6.4 09/06/2020   HGB 11.4 (L) 09/06/2020   HCT 35.4 (L) 09/06/2020   MCV 73.9 (L) 09/06/2020   PLT 185.0 09/06/2020   No results found for: IRON, TIBC, FERRITIN  Attestation Statements:   Reviewed by clinician on day of visit: allergies, medications, problem list,  medical history, surgical history, family history, social history, and previous encounter notes.  Time spent on visit including pre-visit chart review and post-visit care and charting was 30 minutes.    Trude Mcburney, am acting as transcriptionist for [employer], PA-C.  I have reviewed the above documentation for accuracy and completeness, and I agree with the above. Alois Cliche, PA-C

## 2021-01-17 ENCOUNTER — Encounter (INDEPENDENT_AMBULATORY_CARE_PROVIDER_SITE_OTHER): Payer: Self-pay | Admitting: Physician Assistant

## 2021-01-18 NOTE — Telephone Encounter (Signed)
Pt last seen by Tracey Aguilar, PA-C.  

## 2021-01-24 ENCOUNTER — Telehealth (INDEPENDENT_AMBULATORY_CARE_PROVIDER_SITE_OTHER): Payer: 59 | Admitting: Psychology

## 2021-01-24 DIAGNOSIS — F3289 Other specified depressive episodes: Secondary | ICD-10-CM | POA: Diagnosis not present

## 2021-01-24 DIAGNOSIS — F419 Anxiety disorder, unspecified: Secondary | ICD-10-CM | POA: Diagnosis not present

## 2021-01-31 ENCOUNTER — Other Ambulatory Visit: Payer: Self-pay

## 2021-01-31 ENCOUNTER — Ambulatory Visit (INDEPENDENT_AMBULATORY_CARE_PROVIDER_SITE_OTHER): Payer: 59 | Admitting: Physician Assistant

## 2021-01-31 ENCOUNTER — Encounter (INDEPENDENT_AMBULATORY_CARE_PROVIDER_SITE_OTHER): Payer: Self-pay | Admitting: Physician Assistant

## 2021-01-31 VITALS — BP 148/85 | HR 75 | Temp 97.8°F | Ht 67.0 in | Wt 347.0 lb

## 2021-01-31 DIAGNOSIS — Z9189 Other specified personal risk factors, not elsewhere classified: Secondary | ICD-10-CM

## 2021-01-31 DIAGNOSIS — R7303 Prediabetes: Secondary | ICD-10-CM | POA: Diagnosis not present

## 2021-01-31 MED ORDER — METFORMIN HCL ER 500 MG PO TB24: 500.0000 mg | ORAL_TABLET | Freq: Every day | ORAL | 2 refills | Status: DC

## 2021-01-31 MED ORDER — OZEMPIC (0.25 OR 0.5 MG/DOSE) 2 MG/1.5ML ~~LOC~~ SOPN: 0.2500 mg | PEN_INJECTOR | SUBCUTANEOUS | 0 refills | Status: DC

## 2021-02-01 ENCOUNTER — Encounter (INDEPENDENT_AMBULATORY_CARE_PROVIDER_SITE_OTHER): Payer: Self-pay

## 2021-02-08 NOTE — Progress Notes (Signed)
Chief Complaint:   OBESITY Lindsey Figueroa is here to discuss her progress with her obesity treatment plan along with follow-up of her obesity related diagnoses. Lindsey Figueroa is on keeping a food journal and adhering to recommended goals of 1700-1800 calories and 115 grams of protein daily and states she is following her eating plan approximately 95% of the time. Lindsey Figueroa states she is walking and doing strength training for 30 minutes 5 times per week, and cardio for 30 minutes 2 times per week.  Today's visit was #: 7 Starting weight: 373 lbs Starting date: 10/12/2020 Today's weight: 347 lbs Today's date: 01/31/2021 Total lbs lost to date: 26 Total lbs lost since last in-office visit: 1  Interim History: Lindsey Figueroa is averaging 1780 calories and 124 grams of protein daily. She continues to be hungry intermittently. She wants to discuss bariatric surgery today as well as GLP-1's.  Subjective:   1. Pre-diabetes Lindsey Figueroa reports polyphagia. She is on metformin and she denies nausea, vomiting, or muscle weakness.  2. At risk for diabetes mellitus Lindsey Figueroa is at higher than average risk for developing diabetes due to obesity.   Assessment/Plan:   1. Pre-diabetes Lindsey Figueroa agreed to start Ozempic 0.25 mg with no refills, and we will refill metformin for 30 days with 2 refills. She will continue to work on weight loss, exercise, and decreasing simple carbohydrates to help decrease the risk of diabetes.   - metFORMIN (GLUCOPHAGE XR) 500 MG 24 hr tablet; Take 1 tablet (500 mg total) by mouth daily with breakfast.  Dispense: 30 tablet; Refill: 2 - Semaglutide,0.25 or 0.5MG /DOS, (OZEMPIC, 0.25 OR 0.5 MG/DOSE,) 2 MG/1.5ML SOPN; Inject 0.25 mg into the skin once a week.  Dispense: 1.5 mL; Refill: 0  2. At risk for diabetes mellitus Lindsey Figueroa was given approximately 15 minutes of diabetes education and counseling today. We discussed intensive lifestyle modifications today with an emphasis on weight loss as well as  increasing exercise and decreasing simple carbohydrates in her diet. We also reviewed medication options with an emphasis on risk versus benefit of those discussed.   Repetitive spaced learning was employed today to elicit superior memory formation and behavioral change.  3. Morbid obesity (HCC) current bmi 54.34 Lindsey Figueroa is currently in the action stage of change. As such, her goal is to continue with weight loss efforts. She has agreed to keeping a food journal and adhering to recommended goals of 1500-1800 calories and 115 grams of protein daily.   Exercise goals: As is.  Behavioral modification strategies: meal planning and cooking strategies and keeping healthy foods in the home.  Lindsey Figueroa has agreed to follow-up with our clinic in 2 weeks. She was informed of the importance of frequent follow-up visits to maximize her success with intensive lifestyle modifications for her multiple health conditions.   Objective:   Blood pressure (!) 148/85, pulse 75, temperature 97.8 F (36.6 C), height 5\' 7"  (1.702 m), weight (!) 347 lb (157.4 kg), last menstrual period 01/24/2021, SpO2 100 %. Body mass index is 54.35 kg/m.  General: Cooperative, alert, well developed, in no acute distress. HEENT: Conjunctivae and lids unremarkable. Cardiovascular: Regular rhythm.  Lungs: Normal work of breathing. Neurologic: No focal deficits.   Lab Results  Component Value Date   CREATININE 0.69 10/12/2020   BUN 9 10/12/2020   NA 143 10/12/2020   K 4.8 10/12/2020   CL 107 (H) 10/12/2020   CO2 21 10/12/2020   Lab Results  Component Value Date   ALT 18 10/12/2020  AST 13 10/12/2020   ALKPHOS 70 10/12/2020   BILITOT 0.2 10/12/2020   Lab Results  Component Value Date   HGBA1C 6.0 07/24/2011   Lab Results  Component Value Date   INSULIN 14.9 10/12/2020   Lab Results  Component Value Date   TSH 1.78 09/06/2020   Lab Results  Component Value Date   CHOL 141 09/06/2020   HDL 49.50 09/06/2020    LDLCALC 71 09/06/2020   TRIG 102.0 09/06/2020   CHOLHDL 3 09/06/2020   No results found for: VD25OH Lab Results  Component Value Date   WBC 6.4 09/06/2020   HGB 11.4 (L) 09/06/2020   HCT 35.4 (L) 09/06/2020   MCV 73.9 (L) 09/06/2020   PLT 185.0 09/06/2020   No results found for: IRON, TIBC, FERRITIN  Attestation Statements:   Reviewed by clinician on day of visit: allergies, medications, problem list, medical history, surgical history, family history, social history, and previous encounter notes.   Trude Mcburney, am acting as transcriptionist for [employer], PA-C.  I have reviewed the above documentation for accuracy and completeness, and I agree with the above. Alois Cliche, PA-C

## 2021-02-14 ENCOUNTER — Ambulatory Visit (INDEPENDENT_AMBULATORY_CARE_PROVIDER_SITE_OTHER): Payer: 59 | Admitting: Physician Assistant

## 2021-02-21 ENCOUNTER — Encounter (INDEPENDENT_AMBULATORY_CARE_PROVIDER_SITE_OTHER): Payer: Self-pay | Admitting: Family Medicine

## 2021-02-21 ENCOUNTER — Ambulatory Visit (INDEPENDENT_AMBULATORY_CARE_PROVIDER_SITE_OTHER): Payer: 59 | Admitting: Family Medicine

## 2021-02-21 ENCOUNTER — Other Ambulatory Visit: Payer: Self-pay

## 2021-02-21 VITALS — BP 137/81 | HR 65 | Temp 98.0°F | Ht 67.0 in | Wt 346.0 lb

## 2021-02-21 DIAGNOSIS — I10 Essential (primary) hypertension: Secondary | ICD-10-CM | POA: Diagnosis not present

## 2021-02-21 DIAGNOSIS — Z9189 Other specified personal risk factors, not elsewhere classified: Secondary | ICD-10-CM

## 2021-02-21 DIAGNOSIS — Z6841 Body Mass Index (BMI) 40.0 and over, adult: Secondary | ICD-10-CM | POA: Diagnosis not present

## 2021-02-21 DIAGNOSIS — R7303 Prediabetes: Secondary | ICD-10-CM

## 2021-02-21 MED ORDER — METFORMIN HCL 500 MG PO TABS: ORAL_TABLET | ORAL | 0 refills | Status: DC

## 2021-02-22 ENCOUNTER — Ambulatory Visit (INDEPENDENT_AMBULATORY_CARE_PROVIDER_SITE_OTHER): Payer: 59 | Admitting: Physician Assistant

## 2021-02-22 NOTE — Progress Notes (Signed)
Chief Complaint:   OBESITY Lindsey Figueroa is here to discuss her progress with her obesity treatment plan along with follow-up of her obesity related diagnoses. [name] is on keeping a food journal and adhering to recommended goals of 1500-1800 calories and 115 grams of protein daily and states she is following her eating plan approximately 90% of the time. [name] states she is doing 0 minutes 0 times per week.  Today's visit was #: 8 Starting weight: 373 lbs Starting date: 09/22/2020 Today's weight: 346 lbs Today's date: 02/21/2021 Total lbs lost to date: 27 Total lbs lost since last in-office visit: 1  Interim History: [name] has a new job and has caused her to increase travel a lot more. She is having a difficult time with metformin XR. She notes it causes decreased appetite and she is only taking it approximately 50% of the time. Also she feels there is too much food on the Category 4 plan, and she would like to change back to the Category 3 plan. She is not exercising currently.  Subjective:   1. Pre-diabetes [employer] insurance would not cover Ozempic. She is on metformin XR, but per patient her appetite has decreased too much.   2. Essential hypertension Denia's blood pressure is stable today.  3. At risk for diabetes mellitus Zillah is at higher than average risk for developing diabetes due to obesity.   Assessment/Plan:   Medications Discontinued During This Encounter  Medication Reason   metFORMIN (GLUCOPHAGE XR) 500 MG 24 hr tablet      Meds ordered this encounter  Medications   metFORMIN (GLUCOPHAGE) 500 MG tablet    Sig: 1 po with lunch QD    Dispense:  30 tablet    Refill:  0     1. Pre-diabetes [name] agreed to change from metformin XR to metformin 12 hour formula and take 1/2-1 tablet with lunch daily consistently.  2. Essential hypertension [name] will continue working on healthy weight loss and exercise to improve blood pressure control. We will watch for  signs of hypotension as she continues her lifestyle modifications.  3. At risk for diabetes mellitus [name] was given approximately 9 minutes of diabetes education and counseling today. We discussed intensive lifestyle modifications today with an emphasis on weight loss as well as increasing exercise and decreasing simple carbohydrates in her diet. We also reviewed medication options with an emphasis on risk versus benefit of those discussed.   Repetitive spaced learning was employed today to elicit superior memory formation and behavioral change.  4. Obesity with current BMI 54.3 [name] is currently in the action stage of change. As such, her goal is to continue with weight loss efforts. She has agreed to keeping a food journal and adhering to recommended goals of 1500-1800 calories and 115 grams of protein daily.   Exercise goals: All adults should avoid inactivity. Some physical activity is better than none, and adults who participate in any amount of physical activity gain some health benefits.  Behavioral modification strategies: decreasing simple carbohydrates and planning for success.  [name] has agreed to follow-up with our clinic in 2 to 3 weeks. She was informed of the importance of frequent follow-up visits to maximize her success with intensive lifestyle modifications for her multiple health conditions.   Objective:   Blood pressure 137/81, pulse 65, temperature 98 F (36.7 C), height 5\' 7"  (1.702 m), weight (!) 346 lb (156.9 kg), last menstrual period 01/24/2021, SpO2 99 %. Body mass index is 54.19 kg/m.  General: Cooperative, alert, well developed, in no acute distress. HEENT: Conjunctivae and lids unremarkable. Cardiovascular: Regular rhythm.  Lungs: Normal work of breathing. Neurologic: No focal deficits.   Lab Results  Component Value Date   CREATININE 0.69 10/12/2020   BUN 9 10/12/2020   NA 143 10/12/2020   K 4.8 10/12/2020   CL 107 (H) 10/12/2020   CO2 21  10/12/2020   Lab Results  Component Value Date   ALT 18 10/12/2020   AST 13 10/12/2020   ALKPHOS 70 10/12/2020   BILITOT 0.2 10/12/2020   Lab Results  Component Value Date   HGBA1C 6.0 07/24/2011   Lab Results  Component Value Date   INSULIN 14.9 10/12/2020   Lab Results  Component Value Date   TSH 1.78 09/06/2020   Lab Results  Component Value Date   CHOL 141 09/06/2020   HDL 49.50 09/06/2020   LDLCALC 71 09/06/2020   TRIG 102.0 09/06/2020   CHOLHDL 3 09/06/2020   No results found for: VD25OH Lab Results  Component Value Date   WBC 6.4 09/06/2020   HGB 11.4 (L) 09/06/2020   HCT 35.4 (L) 09/06/2020   MCV 73.9 (L) 09/06/2020   PLT 185.0 09/06/2020   No results found for: IRON, TIBC, FERRITIN  Attestation Statements:   Reviewed by clinician on day of visit: allergies, medications, problem list, medical history, surgical history, family history, social history, and previous encounter notes.   Trude Mcburney, am acting as transcriptionist for [employer], DO.  I have reviewed the above documentation for accuracy and completeness, and I agree with the above. Carlye Grippe, D.O.  The 21st Century Cures Act was signed into law in 2016 which includes the topic of electronic health records.  This provides immediate access to information in MyChart.  This includes consultation notes, operative notes, office notes, lab results and pathology reports.  If you have any questions about what you read please let us know at your next visit so we can discuss your concerns and take corrective action if need be.  We are right here with you.

## 2021-03-07 ENCOUNTER — Other Ambulatory Visit: Payer: Self-pay

## 2021-03-07 ENCOUNTER — Encounter (INDEPENDENT_AMBULATORY_CARE_PROVIDER_SITE_OTHER): Payer: Self-pay | Admitting: Family Medicine

## 2021-03-07 ENCOUNTER — Ambulatory Visit (INDEPENDENT_AMBULATORY_CARE_PROVIDER_SITE_OTHER): Payer: 59 | Admitting: Family Medicine

## 2021-03-07 VITALS — BP 150/79 | HR 71 | Temp 98.4°F | Ht 67.0 in | Wt 345.0 lb

## 2021-03-07 DIAGNOSIS — R7303 Prediabetes: Secondary | ICD-10-CM

## 2021-03-07 DIAGNOSIS — Z6841 Body Mass Index (BMI) 40.0 and over, adult: Secondary | ICD-10-CM

## 2021-03-07 DIAGNOSIS — I10 Essential (primary) hypertension: Secondary | ICD-10-CM | POA: Diagnosis not present

## 2021-03-07 DIAGNOSIS — Z9189 Other specified personal risk factors, not elsewhere classified: Secondary | ICD-10-CM

## 2021-03-07 MED ORDER — METFORMIN HCL 500 MG PO TABS: ORAL_TABLET | ORAL | 0 refills | Status: DC

## 2021-03-07 MED ORDER — HYDROCHLOROTHIAZIDE 25 MG PO TABS: 25.0000 mg | ORAL_TABLET | Freq: Every day | ORAL | 0 refills | Status: DC

## 2021-03-08 NOTE — Progress Notes (Signed)
Chief Complaint:   OBESITY Lindsey Figueroa is here to discuss her progress with her obesity treatment plan along with follow-up of her obesity related diagnoses. Lindsey Figueroa is on keeping a food journal and adhering to recommended goals of 1500-1800 calories and 115 grams of protein daily and states she is following her eating plan approximately 85% of the time. Lindsey Figueroa states she is doing 0 minutes 0 times per week.  Today's visit was #: 9 Starting weight: 373 lbs Starting date: 09/22/2020 Today's weight: 345 lbs Today's date: 03/07/2021 Total lbs lost to date: 28 Total lbs lost since last in-office visit: 1  Interim History: Lindsey Figueroa learned a lot by journaling and keeping track on [employer]. She is not eating enough calories or protein more days than not.  Subjective:   1. Pre-diabetes Lindsey Figueroa has a diagnosis of prediabetes based on her elevated HgA1c and was informed this puts her at greater risk of developing diabetes. She continues to work on diet and exercise to decrease her risk of diabetes. She denies nausea or hypoglycemia.  2. Essential hypertension Lindsey Figueroa hasn't taken any medications yet today. Her blood pressures have been suboptimally controlled the last several office visits, but she hadn't taken her medications prior to her appointments.   3. At risk for deficient intake of food Lindsey Figueroa is at a higher than average risk of deficient intake of food due to being under in calories and protein per day.  Assessment/Plan:  No orders of the defined types were placed in this encounter.   Medications Discontinued During This Encounter  Medication Reason   hydrochlorothiazide (HYDRODIURIL) 25 MG tablet Reorder   metFORMIN (GLUCOPHAGE) 500 MG tablet Reorder   Semaglutide,0.25 or 0.5MG /DOS, (OZEMPIC, 0.25 OR 0.5 MG/DOSE,) 2 MG/1.5ML SOPN Error   metoprolol succinate (TOPROL-XL) 50 MG 24 hr tablet Error     Meds ordered this encounter  Medications   metFORMIN (GLUCOPHAGE) 500 MG  tablet    Sig: 1 po with lunch QD    Dispense:  30 tablet    Refill:  0   hydrochlorothiazide (HYDRODIURIL) 25 MG tablet    Sig: Take 1 tablet (25 mg total) by mouth daily.    Dispense:  30 tablet    Refill:  0     1. Pre-diabetes [name] will continue to work on weight loss, exercise, and decreasing simple carbohydrates to help decrease the risk of diabetes. We will refill metformin for 1 month.  - metFORMIN (GLUCOPHAGE) 500 MG tablet; 1 po with lunch QD  Dispense: 30 tablet; Refill: 0  2. Essential hypertension [name] is to check her blood pressure at home daily and bring in her log to her next office visit. She requests a refill for hydrochlorothiazide today. She will continue working on healthy weight loss and exercise to improve blood pressure control. We will refill hydrochlorothiazide for 1 month.   - hydrochlorothiazide (HYDRODIURIL) 25 MG tablet; Take 1 tablet (25 mg total) by mouth daily.  Dispense: 30 tablet; Refill: 0  3. At risk for deficient intake of food Lindsey Figueroa was given extensive education and counseling today of more than 9 minutes on risks associated with deficient food intake.  Counseled her on the importance of following our prescribed meal plan and eating adequate amounts of protein.  Discussed with Lindsey Figueroa that inadequate food intake over longer periods of time can slow their metabolism down significantly.   4. Obesity with current BMI of 54.0 Lindsey Figueroa is currently in the action stage of change. As  such, her goal is to continue with weight loss efforts. She has agreed to keeping a food journal and adhering to recommended goals of 1500-1800 calories and 115 grams of protein daily and use Category 3 as a guide.   Exercise goals: All adults should avoid inactivity. Some physical activity is better than none, and adults who participate in any amount of physical activity gain some health benefits.  Behavioral modification strategies: increasing lean protein  intake, decreasing simple carbohydrates, and keeping a strict food journal.  Lindsey Figueroa has agreed to follow-up with our clinic in 3 weeks. She was informed of the importance of frequent follow-up visits to maximize her success with intensive lifestyle modifications for her multiple health conditions.   Objective:   Blood pressure (!) 150/79, pulse 71, temperature 98.4 F (36.9 C), height 5\' 7"  (1.702 m), weight (!) 345 lb (156.5 kg), SpO2 99 %. Body mass index is 54.03 kg/m.  General: Cooperative, alert, well developed, in no acute distress. HEENT: Conjunctivae and lids unremarkable. Cardiovascular: Regular rhythm.  Lungs: Normal work of breathing. Neurologic: No focal deficits.   Lab Results  Component Value Date   CREATININE 0.69 10/12/2020   BUN 9 10/12/2020   NA 143 10/12/2020   K 4.8 10/12/2020   CL 107 (H) 10/12/2020   CO2 21 10/12/2020   Lab Results  Component Value Date   ALT 18 10/12/2020   AST 13 10/12/2020   ALKPHOS 70 10/12/2020   BILITOT 0.2 10/12/2020   Lab Results  Component Value Date   HGBA1C 6.0 07/24/2011   Lab Results  Component Value Date   INSULIN 14.9 10/12/2020   Lab Results  Component Value Date   TSH 1.78 09/06/2020   Lab Results  Component Value Date   CHOL 141 09/06/2020   HDL 49.50 09/06/2020   LDLCALC 71 09/06/2020   TRIG 102.0 09/06/2020   CHOLHDL 3 09/06/2020   No results found for: VD25OH Lab Results  Component Value Date   WBC 6.4 09/06/2020   HGB 11.4 (L) 09/06/2020   HCT 35.4 (L) 09/06/2020   MCV 73.9 (L) 09/06/2020   PLT 185.0 09/06/2020   No results found for: IRON, TIBC, FERRITIN  Attestation Statements:   Reviewed by clinician on day of visit: allergies, medications, problem list, medical history, surgical history, family history, social history, and previous encounter notes.   09/08/2020, am acting as transcriptionist for Trude Mcburney, DO.  I have reviewed the above documentation for accuracy and  completeness, and I agree with the above. [employer], D.O.  The 21st Century Cures Act was signed into law in 2016 which includes the topic of electronic health records.  This provides immediate access to information in MyChart.  This includes consultation notes, operative notes, office notes, lab results and pathology reports.  If you have any questions about what you read please let 2017 know at your next visit so we can discuss your concerns and take corrective action if need be.  We are right here with you.

## 2021-03-28 ENCOUNTER — Ambulatory Visit (INDEPENDENT_AMBULATORY_CARE_PROVIDER_SITE_OTHER): Payer: 59 | Admitting: Family Medicine

## 2021-04-11 ENCOUNTER — Ambulatory Visit (INDEPENDENT_AMBULATORY_CARE_PROVIDER_SITE_OTHER): Payer: 59 | Admitting: Family Medicine

## 2021-04-11 ENCOUNTER — Encounter (INDEPENDENT_AMBULATORY_CARE_PROVIDER_SITE_OTHER): Payer: Self-pay

## 2021-04-11 ENCOUNTER — Encounter (INDEPENDENT_AMBULATORY_CARE_PROVIDER_SITE_OTHER): Payer: Self-pay | Admitting: Family Medicine

## 2021-04-11 DIAGNOSIS — Z5329 Procedure and treatment not carried out because of patient's decision for other reasons: Secondary | ICD-10-CM

## 2021-06-03 ENCOUNTER — Encounter: Payer: Self-pay | Admitting: Family Medicine

## 2021-06-03 ENCOUNTER — Telehealth: Payer: 59 | Admitting: Family Medicine

## 2021-06-03 DIAGNOSIS — R6889 Other general symptoms and signs: Secondary | ICD-10-CM | POA: Diagnosis not present

## 2021-06-03 MED ORDER — OSELTAMIVIR PHOSPHATE 75 MG PO CAPS
75.0000 mg | ORAL_CAPSULE | Freq: Two times a day (BID) | ORAL | 0 refills | Status: AC
Start: 2021-06-03 — End: 2021-06-08

## 2021-06-03 MED ORDER — FLUTICASONE PROPIONATE 50 MCG/ACT NA SUSP: 2.0000 | Freq: Every day | NASAL | 0 refills | Status: DC

## 2021-06-03 MED ORDER — BENZONATATE 100 MG PO CAPS: 100.0000 mg | ORAL_CAPSULE | Freq: Two times a day (BID) | ORAL | 0 refills | Status: DC | PRN

## 2021-06-03 NOTE — Patient Instructions (Addendum)
I appreciate the opportunity to provide you with care for your health and wellness.  You might have the flu- below is more information on this. Take medication as directed.  Please complete the full course  Hydrate well, get some vitamin C and rest  Please continue to practice social distancing to keep you, your family, and our community safe.  If you must go out, please wear a mask and practice good handwashing.  Have a wonderful day. With [employer]at[employer], Tereasa Coop, DNP, AGNP-BC  Influenza, Adult Influenza is also called "the flu." It is an infection in the lungs, nose, and throat (respiratory tract). It spreads easily from person to person (is contagious). The flu causes symptoms that are like a cold, along with high fever and body aches. What are the causes? This condition is caused by the influenza virus. You can get the virus by: Breathing in droplets that are in the air after a person infected with the flu coughed or sneezed. Touching something that has the virus on it and then touching your mouth, nose, or eyes. What increases the risk? Certain things may make you more likely to get the flu. These include: Not washing your hands often. Having close contact with many people during cold and flu season. Touching your mouth, eyes, or nose without first washing your hands. Not getting a flu shot every year. You may have a higher risk for the flu, and serious problems, such as a lung infection (pneumonia), if you: Are older than 65. Are pregnant. Have a weakened disease-fighting system (immune system) because of a disease or because you are taking certain medicines. Have a long-term (chronic) condition, such as: Heart, kidney, or lung disease. Diabetes. Asthma. Have a liver disorder. Are very overweight (morbidly obese). Have anemia. What are the signs or symptoms? Symptoms usually begin suddenly and last 4-14 days. They may include: Fever and chills. Headaches, body aches,  or muscle aches. Sore throat. Cough. Runny or stuffy (congested) nose. Feeling discomfort in your chest. Not wanting to eat as much as normal. Feeling weak or tired. Feeling dizzy. Feeling sick to your stomach or throwing up. How is this treated? If the flu is found early, you can be treated with antiviral medicine. This can help to reduce how bad the illness is and how long it lasts. This may be given by mouth or through an IV tube. Taking care of yourself at home can help your symptoms get better. Your doctor may want you to: Take over-the-counter medicines. Drink plenty of fluids. The flu often goes away on its own. If you have very bad symptoms or other problems, you may be treated in a hospital. Follow these instructions at home:   Activity Rest as needed. Get plenty of sleep. Stay home from work or school as told by your doctor. Do not leave home until you do not have a fever for 24 hours without taking medicine. Leave home only to go to your doctor. Eating and drinking Take an ORS (oral rehydration solution). This is a drink that is sold at pharmacies and stores. Drink enough fluid to keep your pee pale yellow. Drink clear fluids in small amounts as you are able. Clear fluids include: Water. Ice chips. Fruit juice mixed with water. Low-calorie sports drinks. Eat bland foods that are easy to digest. Eat small amounts as you are able. These foods include: Bananas. Applesauce. Rice. Lean meats. Toast. Crackers. Do not eat or drink: Fluids that have a lot of sugar or  caffeine. Alcohol. Spicy or fatty foods. General instructions Take over-the-counter and prescription medicines only as told by your doctor. Use a cool mist humidifier to add moisture to the air in your home. This can make it easier for you to breathe. When using a cool mist humidifier, clean it daily. Empty water and replace with clean water. Cover your mouth and nose when you cough or sneeze. Wash your  hands with soap and water often and for at least 20 seconds. This is also important after you cough or sneeze. If you cannot use soap and water, use alcohol-based hand sanitizer. Keep all follow-up visits. How is this prevented?  Get a flu shot every year. You may get the flu shot in late summer, fall, or winter. Ask your doctor when you should get your flu shot. Avoid contact with people who are sick during fall and winter. This is cold and flu season. Contact a doctor if: You get new symptoms. You have: Chest pain. Watery poop (diarrhea). A fever. Your cough gets worse. You start to have more mucus. You feel sick to your stomach. You throw up. Get help right away if you: Have shortness of breath. Have trouble breathing. Have skin or nails that turn a bluish color. Have very bad pain or stiffness in your neck. Get a sudden headache. Get sudden pain in your face or ear. Cannot eat or drink without throwing up. These symptoms may represent a serious problem that is an emergency. Get medical help right away. Call your local emergency services (911 in the [place] Do not wait to see if the symptoms will go away. Do not drive yourself to the hospital. Summary Influenza is also called "the flu." It is an infection in the lungs, nose, and throat. It spreads easily from person to person. Take over-the-counter and prescription medicines only as told by your doctor. Getting a flu shot every year is the best way to not get the flu. This information is not intended to replace advice given to you by your health care provider. Make sure you discuss any questions you have with your health care provider. Document Revised: 02/20/2020 Document Reviewed: 02/20/2020 Elsevier Patient Education  2022 [employer].

## 2021-06-03 NOTE — Progress Notes (Signed)
Virtual Visit Consent   Lindsey Figueroa, you are scheduled for a virtual visit with a [facility] provider today.     Just as with appointments in the office, your consent must be obtained to participate.  Your consent will be active for this visit and any virtual visit you may have with one of our providers in the next 365 days.     If you have a MyChart account, a copy of this consent can be sent to you electronically.  All virtual visits are billed to your insurance company just like a traditional visit in the office.    As this is a virtual visit, video technology does not allow for your provider to perform a traditional examination.  This may limit your provider's ability to fully assess your condition.  If your provider identifies any concerns that need to be evaluated in person or the need to arrange testing (such as labs, EKG, etc.), we will make arrangements to do so.     Although advances in technology are sophisticated, we cannot ensure that it will always work on either your end or our end.  If the connection with a video visit is poor, the visit may have to be switched to a telephone visit.  With either a video or telephone visit, we are not always able to ensure that we have a secure connection.     I need to obtain your verbal consent now.   Are you willing to proceed with your visit today?    Lindsey Figueroa has provided verbal consent on 06/03/2021 for a virtual visit (video or telephone).   Freddy Finner, NP   Date: 06/03/2021 8:56 AM   Virtual Visit via Video Note   I, Freddy Finner, connected with  Lindsey Figueroa  ([id number], 11-23-1985) on 06/03/21 at  9:00 AM EST by a video-enabled telemedicine application and verified that I am speaking with the correct person using two identifiers.  Location: Patient: Virtual Visit Location Patient: Home Provider: Virtual Visit Location Provider: Home Office   I discussed the limitations of evaluation and management by  telemedicine and the availability of in person appointments. The patient expressed understanding and agreed to proceed.    History of Present Illness: Lindsey Figueroa is a 35 y.o. who identifies as a female who was assigned female at birth, and is being seen today for cold symptoms. Onset Tuesday morning- came on suddenly- woke up sick. Niece lives with her (68 years old)- has been coughing and sick. No one else in the home is sick. Reports sore throat, headache, body aches, nasal congestion- green yellow color, nasal passage burns with sneezing, watery eyes, ear pain as well.   Tested for COVID negative   Denies fevers, chills, chest pain, shortness of breath.  Denies other symptoms or concerns at this time.  Problems:  Patient Active Problem List   Diagnosis Date Noted   Insulin resistance 12/07/2020   Primary hypertension 09/06/2020   Chronic pain of both knees 09/06/2020   Elevated BP without diagnosis of hypertension 07/15/2020   Low back pain with radiation 07/08/2020   Lower extremity edema 07/08/2020   Preventative health care 12/21/2017   Morbid obesity with BMI of 50.0-59.9, adult (HCC) 09/22/2013   Vaginal discharge 05/20/2012   UTI symptoms 05/20/2012   Contraception management 04/03/2012   MORBID OBESITY 06/23/2010   HIP PAIN, LEFT 05/20/2010   Palpitation 05/20/2010    Allergies: No Known Allergies Medications:  Current  Outpatient Medications:    hydrochlorothiazide (HYDRODIURIL) 25 MG tablet, Take 1 tablet (25 mg total) by mouth daily., Disp: 30 tablet, Rfl: 0   metFORMIN (GLUCOPHAGE) 500 MG tablet, 1 po with lunch QD, Disp: 30 tablet, Rfl: 0  Observations/Objective: Patient is well-developed, well-nourished in no acute distress.  Resting comfortably  at home.  Head is normocephalic, atraumatic.  No labored breathing.  Speech is clear and coherent with logical content.  Patient is alert and oriented at baseline.  Nasal tone, throat is erythremic/ injected -no  white patches noted  Assessment and Plan: 1. Flu-like symptoms S&S are consistent for virus/ flu like  COVID neg OTC measures reviewed and on AVS  Antiviral provided- pt not pregnant   - fluticasone (FLONASE) 50 MCG/ACT nasal spray; Place 2 sprays into both nostrils daily.  Dispense: 16 g; Refill: 0 - benzonatate (TESSALON) 100 MG capsule; Take 1 capsule (100 mg total) by mouth 2 (two) times daily as needed for cough.  Dispense: 20 capsule; Refill: 0 - oseltamivir (TAMIFLU) 75 MG capsule; Take 1 capsule (75 mg total) by mouth 2 (two) times daily for 5 days.  Dispense: 10 capsule; Refill: 0    Reviewed side effects, risks and benefits of medication.    Patient acknowledged agreement and understanding of the plan.   I discussed the assessment and treatment plan with the patient. The patient was provided an opportunity to ask questions and all were answered. The patient agreed with the plan and demonstrated an understanding of the instructions.   The patient was advised to call back or seek an in-person evaluation if the symptoms worsen or if the condition fails to improve as anticipated.   The above assessment and management plan was discussed with the patient. The patient verbalized understanding of and has agreed to the management plan. Patient is aware to call the clinic if symptoms persist or worsen. Patient is aware when to return to the clinic for a follow-up visit. Patient educated on when it is appropriate to go to the emergency department.   Follow Up Instructions: I discussed the assessment and treatment plan with the patient. The patient was provided an opportunity to ask questions and all were answered. The patient agreed with the plan and demonstrated an understanding of the instructions.  A copy of instructions were sent to the patient via MyChart unless otherwise noted below.     The patient was advised to call back or seek an in-person evaluation if the symptoms worsen or  if the condition fails to improve as anticipated.  Time:  I spent 10 minutes with the patient via telehealth technology discussing the above problems/concerns.    Freddy Finner, NP

## 2021-08-13 ENCOUNTER — Telehealth: Payer: 59 | Admitting: Family

## 2021-08-13 DIAGNOSIS — R112 Nausea with vomiting, unspecified: Secondary | ICD-10-CM

## 2021-08-13 DIAGNOSIS — R519 Headache, unspecified: Secondary | ICD-10-CM

## 2021-08-13 MED ORDER — ONDANSETRON HCL 4 MG PO TABS: 4.0000 mg | ORAL_TABLET | Freq: Three times a day (TID) | ORAL | 0 refills | Status: DC | PRN

## 2021-08-13 NOTE — Progress Notes (Signed)
E-Visit for Nausea and Vomiting   We are sorry that you are not feeling well. Here is how we plan to help!  Based on what you have shared with me it looks like you have a Virus that is irritating your GI tract.  Vomiting is the forceful emptying of a portion of the stomach's content through the mouth.  Although nausea and vomiting can make you feel miserable, it's important to remember that these are not diseases, but rather symptoms of an underlying illness.  When we treat short term symptoms, we always caution that any symptoms that persist should be fully evaluated in a medical office.  I have prescribed a medication that will help alleviate your symptoms and allow you to stay hydrated:  Zofran 4 mg 1 tablet every 8 hours as needed for nausea and vomiting.  I recommend you take a COVID test to rule this out. If your headache worsens, or does not improve you need to be seen in person to be evaluated.   HOME CARE: Drink clear liquids.  This is very important! Dehydration (the lack of fluid) can lead to a serious complication.  Start off with 1 tablespoon every 5 minutes for 8 hours. You may begin eating bland foods after 8 hours without vomiting.  Start with saltine crackers, white bread, rice, mashed potatoes, applesauce. After 48 hours on a bland diet, you may resume a normal diet. Try to go to sleep.  Sleep often empties the stomach and relieves the need to vomit.  GET HELP RIGHT AWAY IF:  Your symptoms do not improve or worsen within 2 days after treatment. You have a fever for over 3 days. You cannot keep down fluids after trying the medication.  MAKE SURE YOU:  Understand these instructions. Will watch your condition. Will get help right away if you are not doing well or get worse.    Thank you for choosing an e-visit.  Your e-visit answers were reviewed by a board certified advanced clinical practitioner to complete your personal care plan. Depending upon the condition, your  plan could have included both over the counter or prescription medications.  Please review your pharmacy choice. Make sure the pharmacy is open so you can pick up prescription now. If there is a problem, you may contact your provider through [employer] and have the prescription routed to another pharmacy.  Your safety is important to [place]. If you have drug allergies check your prescription carefully.   For the next 24 hours you can use MyChart to ask questions about today's visit, request a non-urgent call back, or ask for a work or school excuse. You will get an email in the next two days asking about your experience. I hope that your e-visit has been valuable and will speed your recovery.  Approximately 5 minutes was spent documenting and reviewing patient's chart.

## 2021-08-23 ENCOUNTER — Telehealth: Payer: 59 | Admitting: Physician Assistant

## 2021-08-23 DIAGNOSIS — J029 Acute pharyngitis, unspecified: Secondary | ICD-10-CM

## 2021-08-23 MED ORDER — ONDANSETRON 4 MG PO TBDP: 4.0000 mg | ORAL_TABLET | Freq: Three times a day (TID) | ORAL | 0 refills | Status: DC | PRN

## 2021-08-23 NOTE — Progress Notes (Signed)
I have spent 5 minutes in review of e-visit questionnaire, review and updating patient chart, medical decision making and response to patient.   Nakai Pollio Cody Anam Bobby, PA-C    

## 2021-08-23 NOTE — Progress Notes (Signed)
E-Visit for Sore Throat   We are sorry that you are not feeling well.  Here is how we plan to help!   Your symptoms indicate a likely viral infection (Pharyngitis).   Pharyngitis is inflammation in the back of the throat which can cause a sore throat, scratchiness and sometimes difficulty swallowing.   Pharyngitis is typically caused by a respiratory virus and will just run its course.  Please keep in mind that your symptoms could last up to 10 days.  For throat pain, we recommend over the counter oral pain relief medications such as acetaminophen or aspirin, or anti-inflammatory medications such as ibuprofen or naproxen sodium.  Topical treatments such as oral throat lozenges or sprays may be used as needed.  Avoid close contact with loved ones, especially the very young and elderly.  Remember to wash your hands thoroughly throughout the day as this is the number one way to prevent the spread of infection and wipe down door knobs and counters with disinfectant.   After careful review of your answers, I would not recommend an antibiotic for your condition.  Antibiotics should not be used to treat conditions that we suspect are caused by viruses like the virus that causes the common cold or flu. However, some people can have Strep with atypical symptoms. You may need formal testing in clinic or office to confirm if your symptoms continue or worsen.   Providers prescribe antibiotics to treat infections caused by bacteria. Antibiotics are very powerful in treating bacterial infections when they are used properly.  To maintain their effectiveness, they should be used only when necessary.  Overuse of antibiotics has resulted in the development of super bugs that are resistant to treatment!     Home Care: Only take medications as instructed by your medical team. Do not drink alcohol while taking these medications. A steam or ultrasonic humidifier can help congestion.  You can place a towel over your head and  breathe in the steam from hot water coming from a faucet. Avoid close contacts especially the very young and the elderly. Cover your mouth when you cough or sneeze. Always remember to wash your hands.   Get Help Right Away If: You develop worsening fever or throat pain. You develop a severe head ache or visual changes. Your symptoms persist after you have completed your treatment plan.   Make sure you Understand these instructions. Will watch your condition. Will get help right away if you are not doing well or get worse.     Thank you for choosing an e-visit.   Your e-visit answers were reviewed by a board certified advanced clinical practitioner to complete your personal care plan. Depending upon the condition, your plan could have included both over the counter or prescription medications.   Please review your pharmacy choice. Make sure the pharmacy is open so you can pick up prescription now. If there is a problem, you may contact your provider through MyChart messaging and have the prescription routed to another pharmacy.  Your safety is important to us. If you have drug allergies check your prescription carefully.    For the next 24 hours you can use MyChart to ask questions about today's visit, request a non-urgent call back, or ask for a work or school excuse. You will get an email in the next two days asking about your experience. I hope that your e-visit has been valuable and will speed your recovery.  

## 2021-08-23 NOTE — Addendum Note (Signed)
Addended by: Brunetta Jeans on: 08/23/2021 07:55 AM   Modules accepted: Orders

## 2021-10-07 ENCOUNTER — Encounter: Payer: Self-pay | Admitting: Family Medicine

## 2021-10-07 IMAGING — DX DG LUMBAR SPINE COMPLETE 4+V
5 series · 5 of 5 positions shown · non-contrast
Comparison: None.

CLINICAL DATA: Low back and left leg pain for 3 days.

EXAM:
LUMBAR SPINE - COMPLETE 4+ VIEW

[l-spine ap]
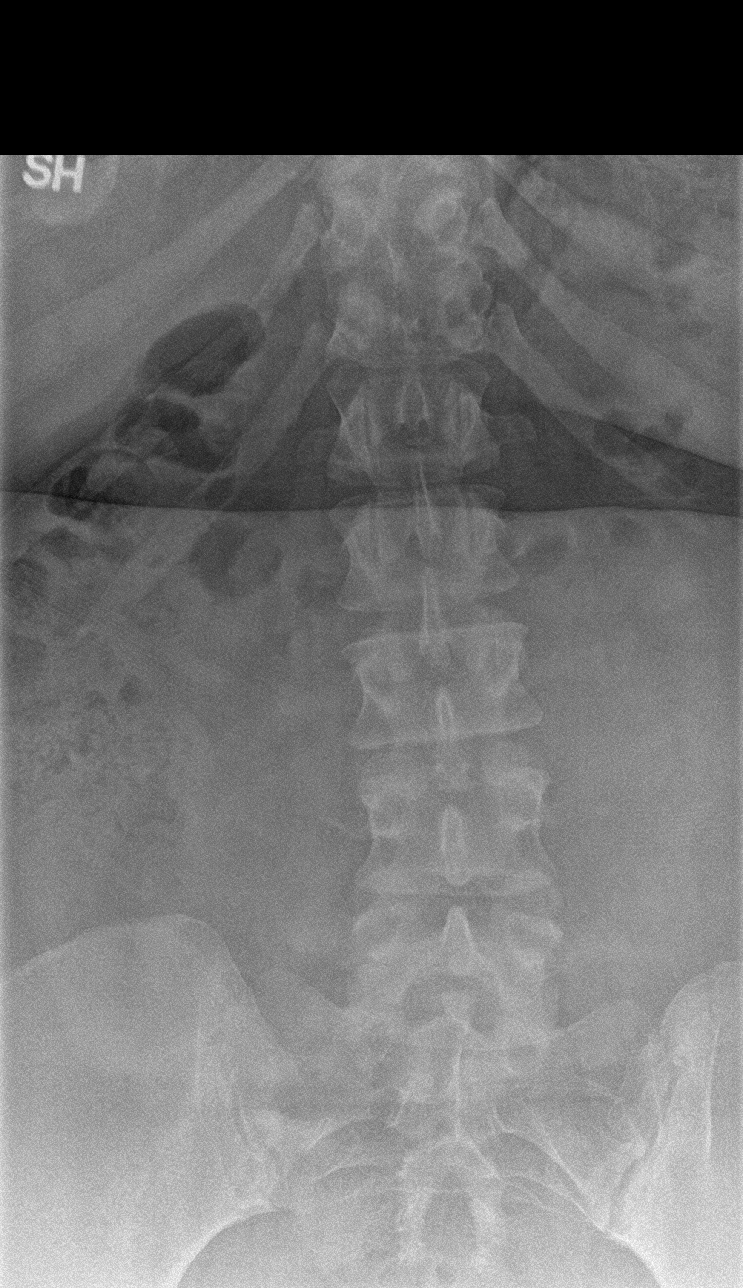

[l-spine obl (1 of 2)]
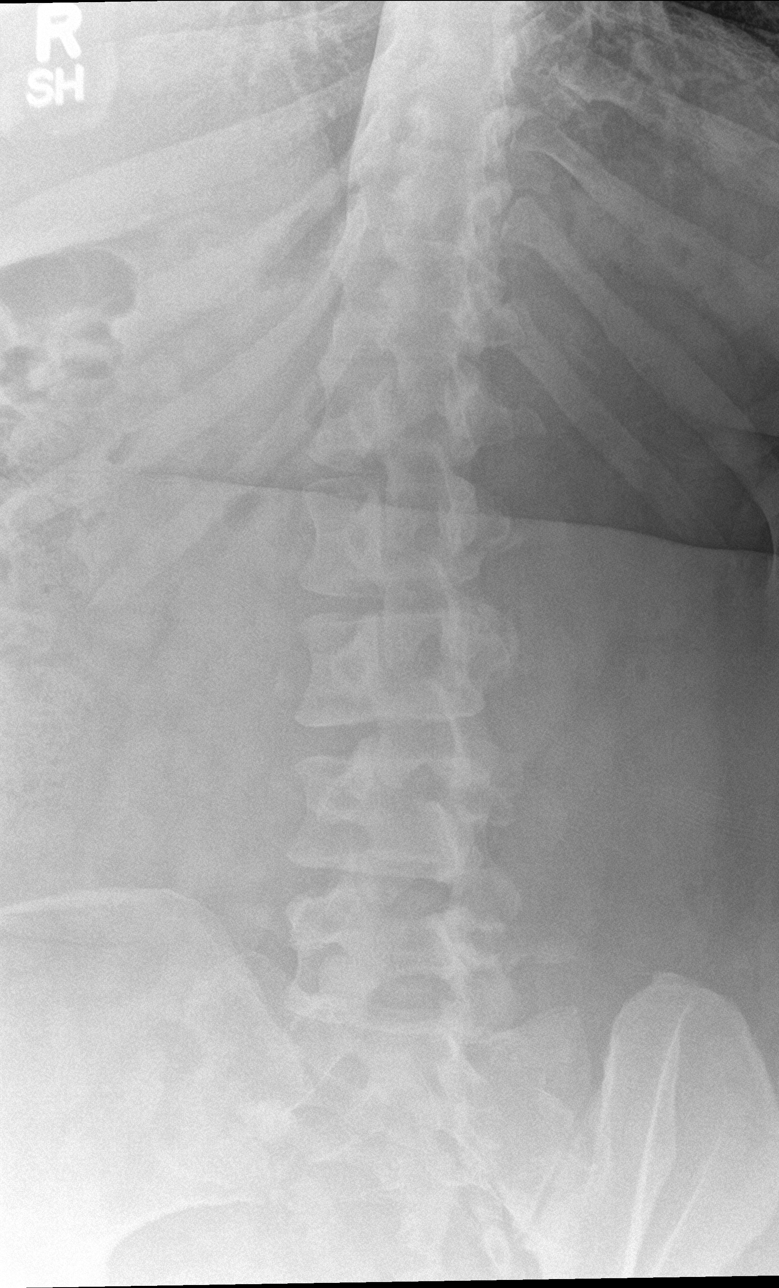

[l-spine obl (2 of 2)]
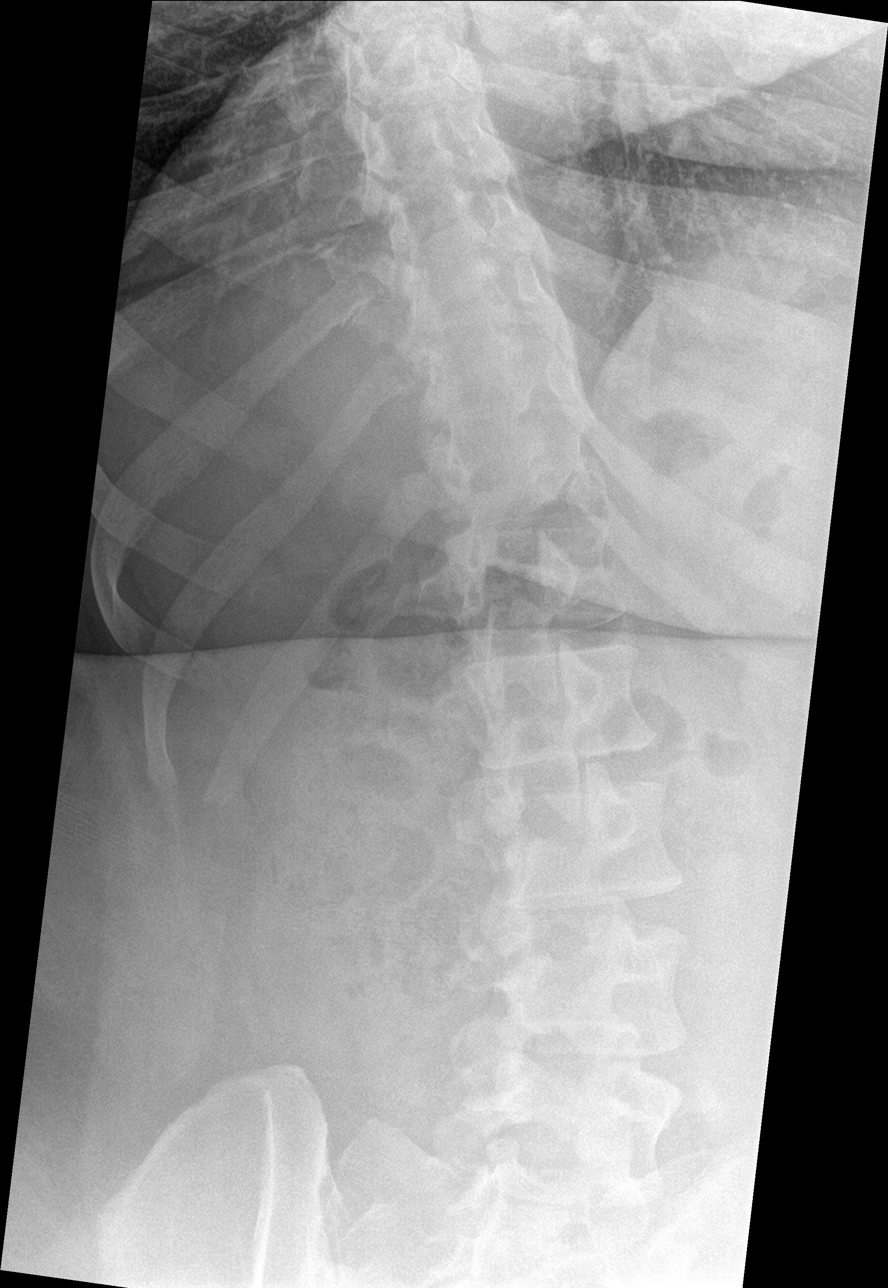

[l-spine lat]
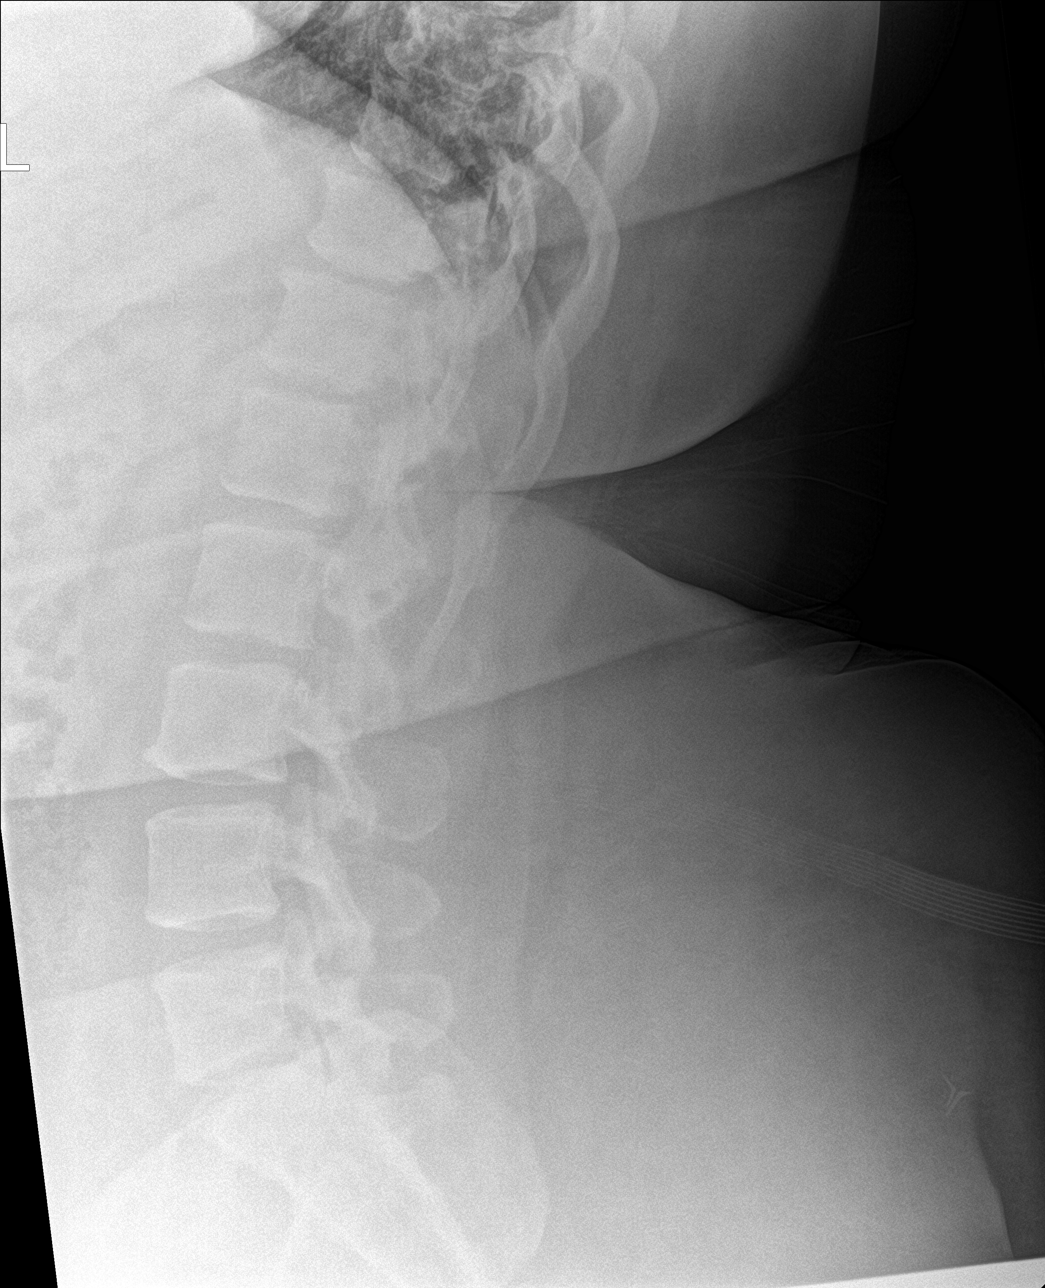

[l-spine spot]
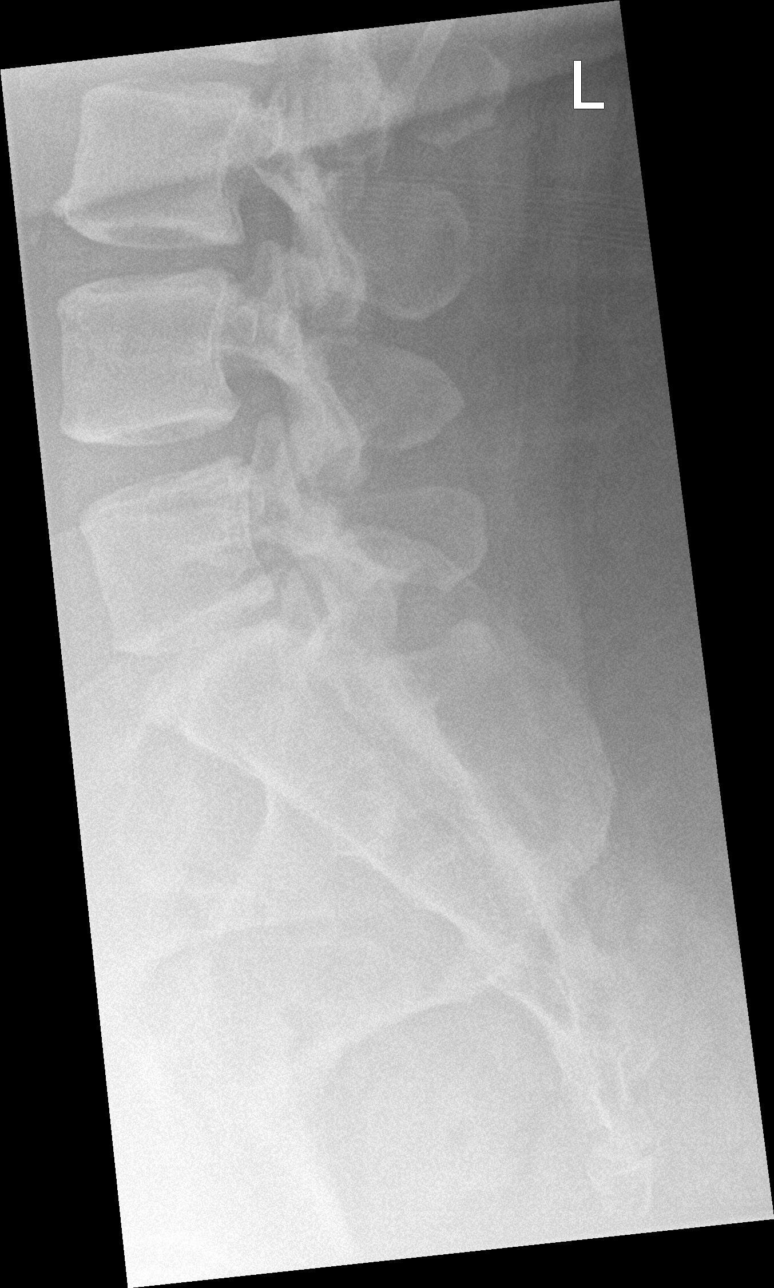

[5 of 5 positions shown; findings below may reference images not displayed]

FINDINGS: There are 5 non rib-bearing lumbar type vertebrae. Vertebral
alignment is normal. No fracture is identified. There is mild disc
space narrowing at L5-S1 with preserved disc space heights elsewhere
in the lumbar spine. Mild anterior endplate spurring is noted at
L3-4 and at multiple levels in the lower thoracic spine.
IMPRESSION: No acute osseous abnormality.  Mild spondylosis.

## 2021-12-12 ENCOUNTER — Encounter: Payer: Self-pay | Admitting: Family Medicine

## 2021-12-20 ENCOUNTER — Ambulatory Visit: Payer: Self-pay | Admitting: Family Medicine

## 2022-02-15 ENCOUNTER — Telehealth: Payer: Self-pay | Admitting: Physician Assistant

## 2022-02-15 ENCOUNTER — Encounter: Payer: Self-pay | Admitting: Family Medicine

## 2022-02-15 DIAGNOSIS — M549 Dorsalgia, unspecified: Secondary | ICD-10-CM

## 2022-02-15 NOTE — Progress Notes (Signed)
Because of severe back pain, I feel your condition warrants further evaluation and I recommend that you be seen in a face to face visit. You need a detailed examination to make sure proper treatments are given. If you can, have someone take you for evaluation at local Urgent Care ASAP if your PCP is unavailable.    NOTE: There will be NO CHARGE for this eVisit   If you are having a true medical emergency please call 911.      For an urgent face to face visit,  has seven urgent care centers for your convenience:     [facility] at [facility] Directions [phone] [address] [place], [place] [address]    [facility]) Get Driving Directions [phone] [address] [place], [place] [address]  [facility] Center [facility]) Get Driving Directions [phone] [address] [place],  [place]  [address]  [facility]gent Care Center [facility] at [employer] Directions  [phone] [address] [place],  [place] [address]   [facility] at [facility] Get Driving Directions [phone] [address], [address] [place], [place] [address]   [facility] Urgent Care at [facility] Get Driving Directions  [phone] [address] [place], [place] [address]   [facility] Health Urgent Care at [facility] Directions [phone] [address], [address] [place], [place] [address]  Your MyChart E-visit questionnaire answers were reviewed by a board certified advanced clinical practitioner to complete your personal care plan based on your specific symptoms.  Thank you for using e-Visits.

## 2022-02-16 ENCOUNTER — Other Ambulatory Visit: Payer: Self-pay

## 2022-02-16 ENCOUNTER — Encounter (HOSPITAL_COMMUNITY): Payer: Self-pay | Admitting: *Deleted

## 2022-02-16 ENCOUNTER — Ambulatory Visit (INDEPENDENT_AMBULATORY_CARE_PROVIDER_SITE_OTHER): Payer: BLUE CROSS/BLUE SHIELD

## 2022-02-16 ENCOUNTER — Ambulatory Visit (HOSPITAL_COMMUNITY)
Admission: EM | Admit: 2022-02-16 | Discharge: 2022-02-16 | Disposition: A | Discharge status: Home / Self Care | Payer: BLUE CROSS/BLUE SHIELD | Attending: Nurse Practitioner | Admitting: Nurse Practitioner

## 2022-02-16 DIAGNOSIS — M5442 Lumbago with sciatica, left side: Secondary | ICD-10-CM | POA: Diagnosis not present

## 2022-02-16 DIAGNOSIS — M51369 Other intervertebral disc degeneration, lumbar region without mention of lumbar back pain or lower extremity pain: Secondary | ICD-10-CM

## 2022-02-16 DIAGNOSIS — M5136 Other intervertebral disc degeneration, lumbar region: Secondary | ICD-10-CM

## 2022-02-16 DIAGNOSIS — M545 Low back pain, unspecified: Secondary | ICD-10-CM

## 2022-02-16 MED ORDER — IBUPROFEN 800 MG PO TABS: 800.0000 mg | ORAL_TABLET | Freq: Three times a day (TID) | ORAL | 0 refills | Status: DC

## 2022-02-16 MED ORDER — METHYLPREDNISOLONE 4 MG PO TBPK: ORAL_TABLET | ORAL | 0 refills | Status: AC

## 2022-02-16 NOTE — ED Provider Notes (Signed)
[facility]    CSN: [id number] Arrival date & time: 02/16/22  2585      History   Chief Complaint Chief Complaint  Patient presents with   Back Pain    HPI Lindsey Figueroa is a 36 y.o. female.   HPI  She is having a 2 day history of left low back pain that radiates to her knee. She has numbness in the back of the leg. She denies any accident or injury, tingling or weakness. She has bilateral ankle swelling but travels a lot with work Home tx [employer], Aleve, ice and heat. The heat was most effective. HX of back last flare was one year ago. The pain can be recurrent worse during menstrual cycle.  Past Medical History:  Diagnosis Date   Back pain    Bilateral swelling of feet and ankles    Depression    Edema    GERD (gastroesophageal reflux disease)    Hypertension    Joint pain    Lactose intolerance    Other fatigue    Prediabetes    Shortness of breath on exertion    STD (sexually transmitted disease)    Chlamydia    Patient Active Problem List   Diagnosis Date Noted   Insulin resistance 12/07/2020   Primary hypertension 09/06/2020   Chronic pain of both knees 09/06/2020   Elevated BP without diagnosis of hypertension 07/15/2020   Low back pain with radiation 07/08/2020   Lower extremity edema 07/08/2020   Preventative health care 12/21/2017   Morbid obesity with BMI of 50.0-59.9, adult (HCC) 09/22/2013   Vaginal discharge 05/20/2012   UTI symptoms 05/20/2012   Contraception management 04/03/2012   MORBID OBESITY 06/23/2010   HIP PAIN, LEFT 05/20/2010   Palpitation 05/20/2010    Past Surgical History:  Procedure Laterality Date   TONSILLECTOMY AND ADENOIDECTOMY     WISDOM TOOTH EXTRACTION      OB History     Gravida  0   Para  0   Term  0   Preterm  0   AB  0   Living  0      SAB  0   IAB  0   Ectopic  0   Multiple  0   Live Births  0            Home Medications    Prior to Admission medications   Medication  Sig Start Date End Date Taking? Authorizing Provider  ibuprofen (ADVIL) 800 MG tablet Take 1 tablet (800 mg total) by mouth 3 (three) times daily. 02/16/22  Yes Barbette Merino, NP  methylPREDNISolone (MEDROL) 4 MG TBPK tablet Follow package instructions. 02/16/22 02/22/22 Yes Kiwan Gadsden, Shana Chute, NP  hydrochlorothiazide (HYDRODIURIL) 25 MG tablet Take 1 tablet (25 mg total) by mouth daily. 03/07/21   Thomasene Lot, DO  metFORMIN (GLUCOPHAGE) 500 MG tablet 1 po with lunch QD 03/07/21   Opalski, Deborah, DO  ondansetron (ZOFRAN-ODT) 4 MG disintegrating tablet Take 1 tablet (4 mg total) by mouth every 8 (eight) hours as needed for nausea or vomiting. 08/23/21   Waldon Merl, PA-C    Family History Family History  Problem Relation Age of Onset   Diabetes Mother    Depression Mother    Sleep apnea Mother    Cancer - Lung Maternal Grandfather        Non smoker   Hypertension Maternal Grandmother     Social History Social History   Tobacco Use  Smoking status: Never   Smokeless tobacco: Never  Vaping Use   Vaping Use: Never used  Substance Use Topics   Alcohol use: No   Drug use: No     Allergies   Patient has no known allergies.   Review of Systems Review of Systems   Physical Exam Triage Vital Signs ED Triage Vitals  Enc Vitals Group     BP 02/16/22 0848 120/73     Pulse Rate 02/16/22 0848 66     Resp 02/16/22 0848 18     Temp 02/16/22 0848 98 F (36.7 C)     Temp src --      SpO2 02/16/22 0848 99 %     Weight --      Height --      Head Circumference --      Peak Flow --      Pain Score 02/16/22 0845 10     Pain Loc --      Pain Edu? --      Excl. in GC? --    No data found.  Updated Vital Signs BP 120/73   Pulse 66   Temp 98 F (36.7 C)   Resp 18   LMP 02/16/2022   SpO2 99%   Visual Acuity Right Eye Distance:   Left Eye Distance:   Bilateral Distance:    Right Eye Near:   Left Eye Near:    Bilateral Near:     Physical Exam Constitutional:       General: She is in acute distress.     Appearance: She is obese. She is not ill-appearing, toxic-appearing or diaphoretic.  HENT:     Head: Normocephalic and atraumatic.  Musculoskeletal:     Lumbar back: Tenderness and bony tenderness present. Decreased range of motion.     Comments: Unable to perform LR Guarded gait   Skin:    Capillary Refill: Capillary refill takes less than 2 seconds.  Neurological:     General: No focal deficit present.     Mental Status: She is alert and oriented to person, place, and time.  Psychiatric:        Mood and Affect: Mood normal.        Behavior: Behavior normal.        Thought Content: Thought content normal.        Judgment: Judgment normal.      UC Treatments / Results  Labs (all labs ordered are listed, but only abnormal results are displayed) Labs Reviewed - No data to display  EKG   Radiology DG Lumbar Spine Complete  Result Date: 02/16/2022 CLINICAL DATA:  Left-sided lower back pain. Pain radiates down the left leg. EXAM: LUMBAR SPINE - COMPLETE 4+ VIEW COMPARISON:  Lumbar spine radiographs 07/08/2020 FINDINGS: There are 5 non-rib-bearing lumbar-type vertebral bodies. Normal frontal alignment. No sagittal spondylolisthesis. Vertebral body heights are maintained. Mild posterior L5-S1 disc space narrowing. Mild anterior L3-4 greater than L4-5 and mild anterior T10-11 and T11-12 endplate spurring. IMPRESSION: No significant change from prior. Mild L5-S1 degenerative disc changes. Electronically Signed   By: Neita Garnet M.D.   On: 02/16/2022 10:25    Procedures Procedures (including critical care time)  Medications Ordered in UC Medications - No data to display  Initial Impression / Assessment and Plan / UC Course  I have reviewed the triage vital signs and the nursing notes.  Pertinent labs & imaging results that were available during my care of the patient were reviewed by  me and considered in my medical decision making (see  chart for details).    Low back pain DDD of lumbar mild Final Clinical Impressions(s) / UC Diagnoses   Final diagnoses:  Left-sided low back pain with left-sided sciatica, unspecified chronicity  DDD (degenerative disc disease), lumbar     Discharge Instructions      Lumbar xray no acute abnormality. You do have some degenerative changes. Treatment Medrol Dosepak take as directed Enourage you to start the exercises per the discharge instructions as tolerated.  After completion Ibuprofen 800 mg every 8 hours as needed for future pain       ED Prescriptions     Medication Sig Dispense Auth. Provider   methylPREDNISolone (MEDROL) 4 MG TBPK tablet Follow package instructions. 21 tablet Thad Ranger M, NP   ibuprofen (ADVIL) 800 MG tablet Take 1 tablet (800 mg total) by mouth 3 (three) times daily. 21 tablet Barbette Merino, NP      PDMP not reviewed this encounter.   Thad Ranger Dodge, NP 02/16/22 1106

## 2022-02-16 NOTE — Discharge Instructions (Addendum)
Lumbar xray no acute abnormality. You do have some mild degenerative changes. Treatment Medrol Dosepak take as directed Enourage you to start the exercises per the discharge instructions as tolerated.  After completion Ibuprofen 800 mg every 8 hours as needed for future pain

## 2022-02-16 NOTE — ED Triage Notes (Signed)
Reports lt side back pain started on Tuesday . Pain radiates down the lt leg.

## 2022-02-17 ENCOUNTER — Encounter: Payer: Self-pay | Admitting: Family Medicine

## 2022-02-17 ENCOUNTER — Ambulatory Visit: Payer: BLUE CROSS/BLUE SHIELD | Admitting: Family Medicine

## 2022-02-17 VITALS — BP 134/80 | HR 71 | Temp 97.8°F | Resp 18 | Ht 67.0 in | Wt 379.2 lb

## 2022-02-17 DIAGNOSIS — Z6841 Body Mass Index (BMI) 40.0 and over, adult: Secondary | ICD-10-CM | POA: Diagnosis not present

## 2022-02-17 DIAGNOSIS — M549 Dorsalgia, unspecified: Secondary | ICD-10-CM

## 2022-02-17 LAB — LIPID PANEL
Cholesterol: 152 mg/dL (ref 0–200)
HDL: 62.7 mg/dL (ref >39.00)
LDL Cholesterol: 79 mg/dL (ref 0–99)
NonHDL: 89.73
Total CHOL/HDL Ratio: 2
Triglycerides: 52 mg/dL (ref 0.0–149.0)
VLDL: 10.4 mg/dL (ref 0.0–40.0)

## 2022-02-17 LAB — VITAMIN D 25 HYDROXY (VIT D DEFICIENCY, FRACTURES): VITD: <7 ng/mL — ABNORMAL LOW (ref 30.00–100.00)

## 2022-02-17 LAB — CBC WITH DIFFERENTIAL/PLATELET
Basophils Absolute: 0.1 10*3/uL (ref 0.0–0.1)
Basophils Relative: 1.2 % (ref 0.0–3.0)
Eosinophils Absolute: 0 10*3/uL (ref 0.0–0.7)
Eosinophils Relative: 0.2 % (ref 0.0–5.0)
HCT: 38.2 % (ref 36.0–46.0)
Hemoglobin: 12.3 g/dL (ref 12.0–15.0)
Lymphocytes Relative: 34.6 % (ref 12.0–46.0)
Lymphs Abs: 2.8 10*3/uL (ref 0.7–4.0)
MCHC: 32.4 g/dL (ref 30.0–36.0)
MCV: 74.4 fl — ABNORMAL LOW (ref 78.0–100.0)
Monocytes Absolute: 0.4 10*3/uL (ref 0.1–1.0)
Monocytes Relative: 4.4 % (ref 3.0–12.0)
Neutro Abs: 4.9 10*3/uL (ref 1.4–7.7)
Neutrophils Relative %: 59.6 % (ref 43.0–77.0)
Platelets: 236 10*3/uL (ref 150.0–400.0)
RBC: 5.13 Mil/uL — ABNORMAL HIGH (ref 3.87–5.11)
RDW: 16.1 % — ABNORMAL HIGH (ref 11.5–15.5)
WBC: 8.2 10*3/uL (ref 4.0–10.5)

## 2022-02-17 LAB — COMPREHENSIVE METABOLIC PANEL
ALT: 13 U/L (ref 0–35)
AST: 13 U/L (ref 0–37)
Albumin: 4 g/dL (ref 3.5–5.2)
Alkaline Phosphatase: 57 U/L (ref 39–117)
BUN: 11 mg/dL (ref 6–23)
CO2: 26 mEq/L (ref 19–32)
Calcium: 8.8 mg/dL (ref 8.4–10.5)
Chloride: 105 mEq/L (ref 96–112)
Creatinine, Ser: 0.7 mg/dL (ref 0.40–1.20)
GFR: 109.76 mL/min (ref >60.00)
Glucose, Bld: 103 mg/dL — ABNORMAL HIGH (ref 70–99)
Potassium: 4.4 mEq/L (ref 3.5–5.1)
Sodium: 137 mEq/L (ref 135–145)
Total Bilirubin: 0.3 mg/dL (ref 0.2–1.2)
Total Protein: 6.8 g/dL (ref 6.0–8.3)

## 2022-02-17 LAB — TSH: TSH: 1.33 u[IU]/mL (ref 0.35–5.50)

## 2022-02-17 LAB — HEMOGLOBIN A1C: Hgb A1c MFr Bld: 6.2 % (ref 4.6–6.5)

## 2022-02-17 MED ORDER — CYCLOBENZAPRINE HCL 10 MG PO TABS: 10.0000 mg | ORAL_TABLET | Freq: Three times a day (TID) | ORAL | 0 refills | Status: DC | PRN

## 2022-02-17 MED ORDER — WEGOVY 0.25 MG/0.5ML ~~LOC~~ SOAJ
0.2500 mg | SUBCUTANEOUS | 0 refills | Status: DC
Start: 2022-02-17 — End: 2022-02-23

## 2022-02-17 NOTE — Progress Notes (Signed)
Subjective:   By signing my name below, I, Luiz Ochoa, attest that this documentation has been prepared under the direction and in the presence of Ann Held, DO 02/17/2022    Patient ID: Lindsey Figueroa, female    DOB: 1986/04/22, 36 y.o.   MRN: [id number]  Chief Complaint  Patient presents with   Sciatica    Left side, sxs start Tuesday evening, pain with walking and laying down.   Joint Swelling    HPI Patient is in today for follow up visit.  She complains of having pain in her back, and her discomfort worsens when menstruating. Her pain originates in the left side of her back and radiates towards her knee. She is not taking her ibuprofen to manage her symptoms because she was instructed not to take it with her steroid. She has started taking steroid at this time.  She reports having constant swelling in her lower extremities bilaterally. She no longer goes to ' [facility] due to her father having an aneurysm last year. She tried to start going again, but her insurance did not cover the bill she was charged despite being told that she did not have any copayments.  She denies having any dysuria or other urinary symptoms.   Past Medical History:  Diagnosis Date   Back pain    Bilateral swelling of feet and ankles    Depression    Edema    GERD (gastroesophageal reflux disease)    Hypertension    Joint pain    Lactose intolerance    Other fatigue    Prediabetes    Shortness of breath on exertion    STD (sexually transmitted disease)    Chlamydia    Past Surgical History:  Procedure Laterality Date   TONSILLECTOMY AND ADENOIDECTOMY     WISDOM TOOTH EXTRACTION      Family History  Problem Relation Age of Onset   Diabetes Mother    Depression Mother    Sleep apnea Mother    Cancer - Lung Maternal Grandfather        Non smoker   Hypertension Maternal Grandmother     Social History   Socioeconomic History   Marital status:  Single    Spouse name: Not on file   Number of children: Not on file   Years of education: Not on file   Highest education level: Not on file  Occupational History   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: [employer]    Comment: [employer]  Tobacco Use   Smoking status: Never   Smokeless tobacco: Never  Vaping Use   Vaping Use: Never used  Substance and Sexual Activity   Alcohol use: No   Drug use: No   Sexual activity: Yes    Partners: Male    Birth control/protection: Condom    Comment: 1st intercourse 36 yo-More than 5 partners  Other Topics Concern   Not on file  Social History Programmer, applications at gym 4x a week   Social Determinants of Health   Financial Resource Strain: Not on file  Food Insecurity: Not on file  Transportation Needs: Not on file  Physical Activity: Not on file  Stress: Not on file  Social Connections: Not on file  Intimate Partner Violence: Not on file    Outpatient Medications Prior to Visit  Medication Sig Dispense Refill   hydrochlorothiazide (HYDRODIURIL) 25 MG tablet Take 1 tablet (25 mg total) by  mouth daily. 30 tablet 0   ibuprofen (ADVIL) 800 MG tablet Take 1 tablet (800 mg total) by mouth 3 (three) times daily. 21 tablet 0   metFORMIN (GLUCOPHAGE) 500 MG tablet 1 po with lunch QD 30 tablet 0   methylPREDNISolone (MEDROL) 4 MG TBPK tablet Follow package instructions. 21 tablet 0   ondansetron (ZOFRAN-ODT) 4 MG disintegrating tablet Take 1 tablet (4 mg total) by mouth every 8 (eight) hours as needed for nausea or vomiting. 20 tablet 0   No facility-administered medications prior to visit.    No Known Allergies  Review of Systems  Constitutional:  Negative for fever and malaise/fatigue.       (+) Swelling in her lower extremities bilaterally  HENT:  Negative for congestion.   Eyes:  Negative for blurred vision.  Respiratory:  Negative for cough and shortness of breath.   Cardiovascular:  Negative for chest pain, palpitations and  leg swelling.  Gastrointestinal:  Negative for vomiting.  Musculoskeletal:  Positive for back pain (modified with menstruation). Negative for myalgias and neck pain.  Skin:  Negative for rash.  Neurological:  Positive for weakness. Negative for loss of consciousness and headaches.       Objective:      BP 134/80 (BP Location: Left Arm, Patient Position: Sitting, Cuff Size: Large)   Pulse 71   Temp 97.8 F (36.6 C) (Oral)   Resp 18   Ht 5' 7"  (1.702 m)   Wt (!) 379 lb 3.2 oz (172 kg)   LMP 02/16/2022   SpO2 97%   BMI 59.39 kg/m  Wt Readings from Last 3 Encounters:  02/17/22 (!) 379 lb 3.2 oz (172 kg)  03/07/21 (!) 345 lb (156.5 kg)  02/21/21 (!) 346 lb (156.9 kg)  BP 134/80 (BP Location: Left Arm, Patient Position: Sitting, Cuff Size: Large)   Pulse 71   Temp 97.8 F (36.6 C) (Oral)   Resp 18   Ht 5' 7"  (1.702 m)   Wt (!) 379 lb 3.2 oz (172 kg)   LMP 02/16/2022   SpO2 97%   BMI 59.39 kg/m  General appearance: alert, cooperative, and mild distress Lungs: clear to auscultation bilaterally Heart: regular rate and rhythm, S1, S2 normal, no murmur, click, rub or gallop Abdomen: soft, non-tender; bowel sounds normal; no masses,  no organomegaly Extremities: extremities normal, atraumatic, no cyanosis or edema Neurologic: 3/5 strength L hip flexion , knee ext , foot dorsit/ plantar flexion  R side normal  Dtr =and b/l    Diabetic Foot Exam - Simple   No data filed    Lab Results  Component Value Date   WBC 6.4 09/06/2020   HGB 11.4 (L) 09/06/2020   HCT 35.4 (L) 09/06/2020   PLT 185.0 09/06/2020   GLUCOSE 99 10/12/2020   CHOL 141 09/06/2020   TRIG 102.0 09/06/2020   HDL 49.50 09/06/2020   LDLCALC 71 09/06/2020   ALT 18 10/12/2020   AST 13 10/12/2020   NA 143 10/12/2020   K 4.8 10/12/2020   CL 107 (H) 10/12/2020   CREATININE 0.69 10/12/2020   BUN 9 10/12/2020   CO2 21 10/12/2020   TSH 1.78 09/06/2020   HGBA1C 6.0 07/24/2011    Lab Results  Component  Value Date   TSH 1.78 09/06/2020   Lab Results  Component Value Date   WBC 6.4 09/06/2020   HGB 11.4 (L) 09/06/2020   HCT 35.4 (L) 09/06/2020   MCV 73.9 (L) 09/06/2020   PLT 185.0 09/06/2020  Lab Results  Component Value Date   NA 143 10/12/2020   K 4.8 10/12/2020   CO2 21 10/12/2020   GLUCOSE 99 10/12/2020   BUN 9 10/12/2020   CREATININE 0.69 10/12/2020   BILITOT 0.2 10/12/2020   ALKPHOS 70 10/12/2020   AST 13 10/12/2020   ALT 18 10/12/2020   PROT 6.3 10/12/2020   ALBUMIN 3.7 (L) 10/12/2020   CALCIUM 8.5 (L) 10/12/2020   ANIONGAP 9 03/17/2020   EGFR 115 10/12/2020   GFR 85.42 09/06/2020   Lab Results  Component Value Date   CHOL 141 09/06/2020   Lab Results  Component Value Date   HDL 49.50 09/06/2020   Lab Results  Component Value Date   LDLCALC 71 09/06/2020   Lab Results  Component Value Date   TRIG 102.0 09/06/2020   Lab Results  Component Value Date   CHOLHDL 3 09/06/2020   Lab Results  Component Value Date   HGBA1C 6.0 07/24/2011       Assessment & Plan:   Problem List Items Addressed This Visit       Unprioritized   Severe back pain    Finish pred taper from er --- started yesterday Xray reviewed  If no better --- consider mri vs PT      Relevant Medications   cyclobenzaprine (FLEXERIL) 10 MG tablet   Other Relevant Orders   CBC with Differential/Platelet   Comprehensive metabolic panel   Lipid panel   TSH   VITAMIN D 25 Hydroxy (Vit-D Deficiency, Fractures)   Insulin, random   Other Visit Diagnoses     Class 3 severe obesity with serious comorbidity and body mass index (BMI) of 50.0 to 59.9 in adult, unspecified obesity type ([place])    -  Primary   Relevant Medications   Semaglutide-Weight Management (WEGOVY) 0.25 MG/0.5ML SOAJ   Other Relevant Orders   CBC with Differential/Platelet   Comprehensive metabolic panel   Lipid panel   TSH   VITAMIN D 25 Hydroxy (Vit-D Deficiency, Fractures)   Insulin, random   Hemoglobin  A1c        Meds ordered this encounter  Medications   cyclobenzaprine (FLEXERIL) 10 MG tablet    Sig: Take 1 tablet (10 mg total) by mouth 3 (three) times daily as needed for muscle spasms.    Dispense:  30 tablet    Refill:  0   Semaglutide-Weight Management (WEGOVY) 0.25 MG/0.5ML SOAJ    Sig: Inject 0.25 mg into the skin once a week.    Dispense:  2 mL    Refill:  0    I, Ann Held, DO, personally preformed the services described in this documentation.  All medical record entries made by the scribe were at my direction and in my presence.  I have reviewed the chart and discharge instructions (if applicable) and agree that the record reflects my personal performance and is accurate and complete. 02/17/2022   I,Tinashe Williams,acting as a scribe for Ann Held, DO.,have documented all relevant documentation on the behalf of Ann Held, DO,as directed by  Ann Held, DO while in the presence of Ann Held, DO.    Ann Held, DO

## 2022-02-17 NOTE — Telephone Encounter (Signed)
Pt has appt today

## 2022-02-17 NOTE — Assessment & Plan Note (Signed)
Finish pred taper from er --- started yesterday Xray reviewed  If no better --- consider mri vs PT

## 2022-02-20 LAB — INSULIN, RANDOM: Insulin: 23.5 u[IU]/mL — ABNORMAL HIGH

## 2022-02-22 ENCOUNTER — Encounter (INDEPENDENT_AMBULATORY_CARE_PROVIDER_SITE_OTHER): Payer: Self-pay

## 2022-02-23 ENCOUNTER — Other Ambulatory Visit (HOSPITAL_BASED_OUTPATIENT_CLINIC_OR_DEPARTMENT_OTHER): Payer: Self-pay

## 2022-02-23 ENCOUNTER — Other Ambulatory Visit: Payer: Self-pay

## 2022-02-23 ENCOUNTER — Ambulatory Visit (INDEPENDENT_AMBULATORY_CARE_PROVIDER_SITE_OTHER): Payer: BLUE CROSS/BLUE SHIELD | Admitting: Family Medicine

## 2022-02-23 ENCOUNTER — Encounter: Payer: Self-pay | Admitting: Family Medicine

## 2022-02-23 VITALS — BP 118/84 | HR 108 | Temp 98.5°F | Resp 18 | Ht 67.0 in | Wt 385.8 lb

## 2022-02-23 DIAGNOSIS — M549 Dorsalgia, unspecified: Secondary | ICD-10-CM | POA: Diagnosis not present

## 2022-02-23 DIAGNOSIS — N62 Hypertrophy of breast: Secondary | ICD-10-CM | POA: Diagnosis not present

## 2022-02-23 DIAGNOSIS — Z6841 Body Mass Index (BMI) 40.0 and over, adult: Secondary | ICD-10-CM

## 2022-02-23 DIAGNOSIS — M545 Low back pain, unspecified: Secondary | ICD-10-CM | POA: Diagnosis not present

## 2022-02-23 MED ORDER — VITAMIN D (ERGOCALCIFEROL) 1.25 MG (50000 UNIT) PO CAPS: 50000.0000 [IU] | ORAL_CAPSULE | ORAL | 1 refills | Status: DC

## 2022-02-23 MED ORDER — WEGOVY 0.25 MG/0.5ML ~~LOC~~ SOAJ: 0.2500 mg | SUBCUTANEOUS | 0 refills | Status: DC

## 2022-02-23 NOTE — Assessment & Plan Note (Signed)
Pt will check with her ins about wegovy

## 2022-02-23 NOTE — Assessment & Plan Note (Signed)
No better with pred taper and muscle relaxer  Referral to PT and get MRI  con't muscle relaxer prn  rto prn

## 2022-02-23 NOTE — Progress Notes (Signed)
Subjective:   By signing my name below, I, Shehryar Baig, attest that this documentation has been prepared under the direction and in the presence of Ann Held, DO  02/23/2022    Patient ID: Lindsey Figueroa, female    DOB: 1985/11/10, 36 y.o.   MRN: [id number]  Chief Complaint  Patient presents with   Sciatica    Pt states steroid helped but states pain is back. Pt states pain is across her back and shooting down her leg and pulling feeling.     HPI Patient is in today for a office visit.   She continues complaining of pain going down her right leg to her knee. She also notes her pain developed across her lower back. She continues taking 10 mg flexeril, 800 mg ibuprofen and finds mild relief. She notes the medication helps mask the pain temporarily but her pain resumes shortly after.  She is requesting for a breast reduction procedure. She is interested in seeing a Psychiatric nurse for a consultation.  She was told that wegovy is on back order at the moment and she could not receive it.    Past Medical History:  Diagnosis Date   Back pain    Bilateral swelling of feet and ankles    Depression    Edema    GERD (gastroesophageal reflux disease)    Hypertension    Joint pain    Lactose intolerance    Other fatigue    Prediabetes    Shortness of breath on exertion    STD (sexually transmitted disease)    Chlamydia    Past Surgical History:  Procedure Laterality Date   TONSILLECTOMY AND ADENOIDECTOMY     WISDOM TOOTH EXTRACTION      Family History  Problem Relation Age of Onset   Diabetes Mother    Depression Mother    Sleep apnea Mother    Cancer - Lung Maternal Grandfather        Non smoker   Hypertension Maternal Grandmother     Social History   Socioeconomic History   Marital status: Single    Spouse name: Not on file   Number of children: Not on file   Years of education: Not on file   Highest education level: Not on file  Occupational  History   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: [employer]    Comment: [employer]  Tobacco Use   Smoking status: Never   Smokeless tobacco: Never  Vaping Use   Vaping Use: Never used  Substance and Sexual Activity   Alcohol use: No   Drug use: No   Sexual activity: Yes    Partners: Male    Birth control/protection: Condom    Comment: 1st intercourse 36 yo-More than 5 partners  Other Topics Concern   Not on file  Social History Programmer, applications at gym 4x a week   Social Determinants of Health   Financial Resource Strain: Not on file  Food Insecurity: Not on file  Transportation Needs: Not on file  Physical Activity: Not on file  Stress: Not on file  Social Connections: Not on file  Intimate Partner Violence: Not on file    Outpatient Medications Prior to Visit  Medication Sig Dispense Refill   cyclobenzaprine (FLEXERIL) 10 MG tablet Take 1 tablet (10 mg total) by mouth 3 (three) times daily as needed for muscle spasms. 30 tablet 0   hydrochlorothiazide (HYDRODIURIL) 25 MG tablet Take 1 tablet (  25 mg total) by mouth daily. 30 tablet 0   ibuprofen (ADVIL) 800 MG tablet Take 1 tablet (800 mg total) by mouth 3 (three) times daily. 21 tablet 0   metFORMIN (GLUCOPHAGE) 500 MG tablet 1 po with lunch QD 30 tablet 0   ondansetron (ZOFRAN-ODT) 4 MG disintegrating tablet Take 1 tablet (4 mg total) by mouth every 8 (eight) hours as needed for nausea or vomiting. 20 tablet 0   Vitamin D, Ergocalciferol, (DRISDOL) 1.25 MG (50000 UNIT) CAPS capsule Take 1 capsule (50,000 Units total) by mouth every 7 (seven) days. 12 capsule 1   Semaglutide-Weight Management (WEGOVY) 0.25 MG/0.5ML SOAJ Inject 0.25 mg into the skin once a week. 2 mL 0   No facility-administered medications prior to visit.    No Known Allergies  Review of Systems  Constitutional:  Negative for fever and malaise/fatigue.  HENT:  Negative for congestion.   Eyes:  Negative for blurred vision.  Respiratory:   Negative for shortness of breath.   Cardiovascular:  Negative for chest pain, palpitations and leg swelling.  Gastrointestinal:  Negative for abdominal pain, blood in stool and nausea.  Genitourinary:  Negative for dysuria and frequency.  Musculoskeletal:  Negative for falls.  Skin:  Negative for rash.  Neurological:  Negative for dizziness, loss of consciousness and headaches.  Endo/Heme/Allergies:  Negative for environmental allergies.  Psychiatric/Behavioral:  Negative for depression. The patient is not nervous/anxious.        Objective:    Physical Exam Vitals and nursing note reviewed.  Constitutional:      General: She is not in acute distress.    Appearance: Normal appearance. She is not ill-appearing.  HENT:     Head: Normocephalic and atraumatic.     Right Ear: External ear normal.     Left Ear: External ear normal.  Eyes:     Extraocular Movements: Extraocular movements intact.     Pupils: Pupils are equal, round, and reactive to light.  Cardiovascular:     Rate and Rhythm: Normal rate and regular rhythm.     Heart sounds: Normal heart sounds. No murmur heard.    No gallop.  Pulmonary:     Effort: Pulmonary effort is normal. No respiratory distress.     Breath sounds: Normal breath sounds. No wheezing or rales.  Musculoskeletal:     Comments: 3/5 hip flexion on left 3/5 knee flexion on left Toe flexion/extension is normal.  Foot flexion is normal  Skin:    General: Skin is warm and dry.  Neurological:     Mental Status: She is alert and oriented to person, place, and time.  Psychiatric:        Judgment: Judgment normal.     BP 118/84 (BP Location: Left Arm, Patient Position: Sitting, Cuff Size: Normal)   Pulse (!) 108   Temp 98.5 F (36.9 C) (Oral)   Resp 18   Ht 5' 7"  (1.702 m)   Wt (!) 385 lb 12.8 oz (175 kg)   LMP 02/16/2022   SpO2 99%   BMI 60.42 kg/m  Wt Readings from Last 3 Encounters:  02/23/22 (!) 385 lb 12.8 oz (175 kg)  02/17/22 (!) 379  lb 3.2 oz (172 kg)  03/07/21 (!) 345 lb (156.5 kg)    Diabetic Foot Exam - Simple   No data filed    Lab Results  Component Value Date   WBC 8.2 02/17/2022   HGB 12.3 02/17/2022   HCT 38.2 02/17/2022   PLT 236.0  02/17/2022   GLUCOSE 103 (H) 02/17/2022   CHOL 152 02/17/2022   TRIG 52.0 02/17/2022   HDL 62.70 02/17/2022   LDLCALC 79 02/17/2022   ALT 13 02/17/2022   AST 13 02/17/2022   NA 137 02/17/2022   K 4.4 02/17/2022   CL 105 02/17/2022   CREATININE 0.70 02/17/2022   BUN 11 02/17/2022   CO2 26 02/17/2022   TSH 1.33 02/17/2022   HGBA1C 6.2 02/17/2022    Lab Results  Component Value Date   TSH 1.33 02/17/2022   Lab Results  Component Value Date   WBC 8.2 02/17/2022   HGB 12.3 02/17/2022   HCT 38.2 02/17/2022   MCV 74.4 (L) 02/17/2022   PLT 236.0 02/17/2022   Lab Results  Component Value Date   NA 137 02/17/2022   K 4.4 02/17/2022   CO2 26 02/17/2022   GLUCOSE 103 (H) 02/17/2022   BUN 11 02/17/2022   CREATININE 0.70 02/17/2022   BILITOT 0.3 02/17/2022   ALKPHOS 57 02/17/2022   AST 13 02/17/2022   ALT 13 02/17/2022   PROT 6.8 02/17/2022   ALBUMIN 4.0 02/17/2022   CALCIUM 8.8 02/17/2022   ANIONGAP 9 03/17/2020   EGFR 115 10/12/2020   GFR 109.76 02/17/2022   Lab Results  Component Value Date   CHOL 152 02/17/2022   Lab Results  Component Value Date   HDL 62.70 02/17/2022   Lab Results  Component Value Date   LDLCALC 79 02/17/2022   Lab Results  Component Value Date   TRIG 52.0 02/17/2022   Lab Results  Component Value Date   CHOLHDL 2 02/17/2022   Lab Results  Component Value Date   HGBA1C 6.2 02/17/2022       Assessment & Plan:   Problem List Items Addressed This Visit       Unprioritized   Morbid obesity with BMI of 50.0-59.9, adult ([place])    Pt will check with her ins about wegovy      Relevant Medications   Semaglutide-Weight Management (WEGOVY) 0.25 MG/0.5ML SOAJ   Low back pain with radiation - Primary    No better  with pred taper and muscle relaxer  Referral to PT and get MRI  con't muscle relaxer prn  rto prn       Relevant Orders   MR Lumbar Spine Wo Contrast   Ambulatory referral to Physical Therapy   Other Visit Diagnoses     Large breasts       Relevant Orders   Ambulatory referral to Plastic Surgery   Upper back pain       Relevant Orders   Ambulatory referral to Plastic Surgery   Class 3 severe obesity with serious comorbidity and body mass index (BMI) of 50.0 to 59.9 in adult, unspecified obesity type (HCC)       Relevant Medications   Semaglutide-Weight Management (WEGOVY) 0.25 MG/0.5ML SOAJ        Meds ordered this encounter  Medications   Semaglutide-Weight Management (WEGOVY) 0.25 MG/0.5ML SOAJ    Sig: Inject 0.25 mg into the skin once a week.    Dispense:  2 mL    Refill:  0    I, Ann Held, DO, personally preformed the services described in this documentation.  All medical record entries made by the scribe were at my direction and in my presence.  I have reviewed the chart and discharge instructions (if applicable) and agree that the record reflects my personal performance and is accurate  and complete. 02/23/2022   I,Shehryar Baig,acting as a scribe for Ann Held, DO.,have documented all relevant documentation on the behalf of Ann Held, DO,as directed by  Ann Held, DO while in the presence of Ann Held, DO.   Ann Held, DO

## 2022-02-23 NOTE — Patient Instructions (Addendum)

## 2022-02-24 NOTE — Telephone Encounter (Signed)
Pt had office visit yesterday.  

## 2022-03-06 ENCOUNTER — Ambulatory Visit: Payer: BLUE CROSS/BLUE SHIELD | Admitting: Physical Therapy

## 2022-03-10 ENCOUNTER — Institutional Professional Consult (permissible substitution): Payer: BLUE CROSS/BLUE SHIELD | Admitting: Plastic Surgery

## 2022-03-23 ENCOUNTER — Institutional Professional Consult (permissible substitution): Payer: BLUE CROSS/BLUE SHIELD | Admitting: Plastic Surgery

## 2022-04-03 ENCOUNTER — Encounter (INDEPENDENT_AMBULATORY_CARE_PROVIDER_SITE_OTHER): Payer: Self-pay | Admitting: Internal Medicine

## 2022-04-03 ENCOUNTER — Ambulatory Visit (INDEPENDENT_AMBULATORY_CARE_PROVIDER_SITE_OTHER): Payer: BLUE CROSS/BLUE SHIELD | Admitting: Internal Medicine

## 2022-04-03 VITALS — BP 151/78 | HR 75 | Temp 98.3°F | Ht 67.0 in | Wt 376.0 lb

## 2022-04-03 DIAGNOSIS — I10 Essential (primary) hypertension: Secondary | ICD-10-CM

## 2022-04-03 DIAGNOSIS — R600 Localized edema: Secondary | ICD-10-CM

## 2022-04-03 DIAGNOSIS — R7303 Prediabetes: Secondary | ICD-10-CM

## 2022-04-03 DIAGNOSIS — Z6841 Body Mass Index (BMI) 40.0 and over, adult: Secondary | ICD-10-CM

## 2022-04-03 DIAGNOSIS — R5383 Other fatigue: Secondary | ICD-10-CM

## 2022-04-03 DIAGNOSIS — Z0289 Encounter for other administrative examinations: Secondary | ICD-10-CM

## 2022-04-03 NOTE — Progress Notes (Unsigned)
  Office: [phone]  /  Fax: [phone]  New Patient Consultation  Lindsey Figueroa was seen in clinic today to evaluate for obesity. We did a consultation to discuss her options for treatment and educate the patient on her disease state. 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 She desires to lose weight to improve health, lower risks and improve swelling in her ankles.   Lindsey Figueroa's evaluation and workup began today. She was weighed on the bioimpedance scale and results were discussed and documented in the synopsis. Blood pressure (!) 151/78, pulse 75, temperature 98.3 F (36.8 C), height 5\' 7"  (1.702 m), weight (!) 376 lb (170.6 kg), SpO2 99 %. Body mass index is 58.89 kg/m.   Obesity education preformed today:  We discussed obesity as a disease and the importance of a more detailed evaluation of all the factors contributing to the disease.  We discussed the importance of long term lifestyle changes which include nutrition, exercise and behavioral modifications as well as the importance of customizing this to her specific health and social needs.  We discussed the benefits of reaching a healthier weight to alleviate the symptoms or reduce the risks of biomechanical, metabolic and psychological effects of obesity.  Current health conditions we discussed, and we expect to improve include:   Class 3 severe obesity with serious comorbidity and body mass index (BMI) of 50.0 to 59.9 in adult, unspecified obesity type ([place])  Pre-diabetes  Essential hypertension  Lower extremity edema.  We discussed the goals of this program is to improve her overall health and not simply achieve a specific BMI.  Frequent visits are very important to patient success. I plan to see her every 2 weeks for the first 3 months and then evaluate the visit frequency after that time. I explained obesity is a life-long chronic disease and long term treatments would be required. Medications  to help her follow his eating plan may be offered as appropriate but are not required. All medication decisions will be made together after the initial workup is done and benefits and side effects are discussed in depth.  The clinic rules were reviewed including the late policy, cancellation policy, no show and program fees.  Lindsey Figueroa appears to be in the action stage of change and agrees they are ready to start intensive lifestyle modifications and behavioral modifications. We will schedule a longer visit to continue the evaluation including fasting labs, indirect calorimetry, ECG and a full nutritional and lifestyle evaluation.  25 minutes was spent today on this visit including the above counseling, pre-visit chart review, and post-visit documentation.

## 2022-04-04 ENCOUNTER — Emergency Department (HOSPITAL_BASED_OUTPATIENT_CLINIC_OR_DEPARTMENT_OTHER)
Admission: EM | Admit: 2022-04-04 | Discharge: 2022-04-04 | Disposition: A | Discharge status: Home / Self Care | Payer: BLUE CROSS/BLUE SHIELD | Attending: Emergency Medicine | Admitting: Emergency Medicine

## 2022-04-04 ENCOUNTER — Other Ambulatory Visit: Payer: Self-pay

## 2022-04-04 ENCOUNTER — Telehealth: Payer: Self-pay | Admitting: Physician Assistant

## 2022-04-04 ENCOUNTER — Encounter (HOSPITAL_BASED_OUTPATIENT_CLINIC_OR_DEPARTMENT_OTHER): Payer: Self-pay

## 2022-04-04 DIAGNOSIS — R0981 Nasal congestion: Secondary | ICD-10-CM | POA: Insufficient documentation

## 2022-04-04 DIAGNOSIS — R221 Localized swelling, mass and lump, neck: Secondary | ICD-10-CM

## 2022-04-04 DIAGNOSIS — R070 Pain in throat: Secondary | ICD-10-CM | POA: Diagnosis present

## 2022-04-04 DIAGNOSIS — L539 Erythematous condition, unspecified: Secondary | ICD-10-CM | POA: Diagnosis not present

## 2022-04-04 DIAGNOSIS — J029 Acute pharyngitis, unspecified: Secondary | ICD-10-CM

## 2022-04-04 DIAGNOSIS — M791 Myalgia, unspecified site: Secondary | ICD-10-CM | POA: Insufficient documentation

## 2022-04-04 DIAGNOSIS — Z20822 Contact with and (suspected) exposure to covid-19: Secondary | ICD-10-CM | POA: Insufficient documentation

## 2022-04-04 LAB — SARS CORONAVIRUS 2 BY RT PCR: SARS Coronavirus 2 by RT PCR: NEGATIVE

## 2022-04-04 LAB — GROUP A STREP BY PCR: Group A Strep by PCR: NOT DETECTED

## 2022-04-04 MED ORDER — DEXAMETHASONE 4 MG PO TABS
10.0000 mg | ORAL_TABLET | Freq: Once | ORAL | Status: AC
  Administered 2022-04-04: 10 mg via ORAL
  Filled 2022-04-04: qty 3

## 2022-04-04 NOTE — Progress Notes (Signed)
Because of severe throat swelling along with this and need for detailed exam and possible IM steroids, I feel your condition warrants further evaluation and I recommend that you be seen in a face to face visit.   NOTE: There will be NO CHARGE for this eVisit   If you are having a true medical emergency please call 911.      For an urgent face to face visit, [facility] has seven urgent care centers for your convenience:     [facility] [place] Get Driving Directions [phone] [address] [place], [place] [address]    [facility]) Get Driving Directions [phone] [place], [place] [address]  [facility] [place]) Get Driving Directions [phone] [address] [place],  [place]  [address]  [facility] [address] [place] Get Driving Directions  [phone] [place],  [place] [address]   [facility] Get Driving Directions [phone] [address] [place], [place], [place] [address]   [facility] Get Driving Directions  [phone] [address] [place], [place] [address]   [facility] [place] Get Driving Directions [phone] [address] [place], [place] [address]  Your MyChart E-visit questionnaire answers were reviewed by a board certified advanced clinical practitioner to complete your personal care plan based on your specific symptoms.  Thank you for using e-Visits.

## 2022-04-04 NOTE — Discharge Instructions (Addendum)
I have treated you with a long-acting steroid to help with your symptoms.  Suspect that this is likely a viral or allergy process.  If your strep test is positive I will send an antibiotic to your pharmacy.  Please follow-up with your MyChart.  If both your COVID and strep test are negative I still suspect some other virus or probably allergy process.  Follow-up with your primary care doctor if you do not improve or return if symptoms worsen.

## 2022-04-04 NOTE — ED Provider Notes (Signed)
[facility] EMERGENCY DEPT Provider Note   CSN: [id number] Arrival date & time: 04/04/22  0740     History  Chief Complaint  Patient presents with   Sore Throat    Lindsey Figueroa is a 36 y.o. female.  Sore throat, nasal congestion and body aches for the last 2 days.  Normal vitals.  No fever.  No significant medical history.  Nothing makes it worse or better.  No chest pain or shortness of breath or abdominal pain or nausea or vomiting.  No weakness or numbness.  No difficulty swallowing or opening her mouth.  Denies any sick contacts.  The history is provided by the patient.       Home Medications Prior to Admission medications   Medication Sig Start Date End Date Taking? Authorizing Provider  cyclobenzaprine (FLEXERIL) 10 MG tablet Take 1 tablet (10 mg total) by mouth 3 (three) times daily as needed for muscle spasms. 02/17/22   Ann Held, DO  hydrochlorothiazide (HYDRODIURIL) 25 MG tablet Take 1 tablet (25 mg total) by mouth daily. 03/07/21   Mellody Dance, DO  ibuprofen (ADVIL) 800 MG tablet Take 1 tablet (800 mg total) by mouth 3 (three) times daily. 02/16/22   Vevelyn Francois, NP  ondansetron (ZOFRAN-ODT) 4 MG disintegrating tablet Take 1 tablet (4 mg total) by mouth every 8 (eight) hours as needed for nausea or vomiting. 08/23/21   Brunetta Jeans, PA-C      Allergies    Patient has no known allergies.    Review of Systems   Review of Systems  Physical Exam Updated Vital Signs Pulse 94   Temp 98.4 F (36.9 C) (Temporal)   Resp 15   Ht 5\' 7"  (1.702 m)   Wt (!) 170.5 kg   SpO2 100%   BMI 58.89 kg/m  Physical Exam Vitals and nursing note reviewed.  Constitutional:      General: She is not in acute distress.    Appearance: She is well-developed.  HENT:     Head: Normocephalic and atraumatic.     Nose: Congestion present.     Mouth/Throat:     Mouth: Mucous membranes are moist.     Pharynx: Uvula midline. Posterior oropharyngeal  erythema present. No pharyngeal swelling, oropharyngeal exudate or uvula swelling.     Tonsils: No tonsillar exudate or tonsillar abscesses. 0 on the right. 0 on the left.  Eyes:     Conjunctiva/sclera: Conjunctivae normal.  Cardiovascular:     Rate and Rhythm: Normal rate and regular rhythm.     Heart sounds: Normal heart sounds. No murmur heard. Pulmonary:     Effort: Pulmonary effort is normal. No respiratory distress.     Breath sounds: Normal breath sounds.  Abdominal:     Palpations: Abdomen is soft.     Tenderness: There is no abdominal tenderness.  Musculoskeletal:        General: No swelling.     Cervical back: Normal range of motion and neck supple.  Lymphadenopathy:     Cervical: No cervical adenopathy.  Skin:    General: Skin is warm and dry.     Capillary Refill: Capillary refill takes less than 2 seconds.  Neurological:     Mental Status: She is alert.  Psychiatric:        Mood and Affect: Mood normal.     ED Results / Procedures / Treatments   Labs (all labs ordered are listed, but only abnormal results are displayed) Labs  Reviewed  GROUP A STREP BY PCR  SARS CORONAVIRUS 2 BY RT PCR    EKG None  Radiology No results found.  Procedures Procedures    Medications Ordered in ED Medications  dexamethasone (DECADRON) tablet 10 mg (has no administration in time range)    ED Course/ Medical Decision Making/ A&P                           Medical Decision Making Risk Prescription drug management.  Lillian Ballester Dinkel is here with sore throat.  Normal vitals.  No fever.  No significant medical comorbidities.  Differential diagnosis is viral process versus strep versus allergies.  No concern for pregnancy.  Will give Decadron.  Will prescribe antibiotic if strep test is positive.  Otherwise return precautions given.  Suspect viral versus allergy process clinically.  Clear breath sounds.  No respiratory symptoms.  No sign of deep space throat infection or  submandibular swelling.  Understands return precautions.  Discharged in good condition.  This chart was dictated using voice recognition software.  Despite best efforts to proofread,  errors can occur which can change the documentation meaning.         Final Clinical Impression(s) / ED Diagnoses Final diagnoses:  Throat pain in adult    Rx / DC Orders ED Discharge Orders     None         Virgina Norfolk, DO 04/04/22 9379

## 2022-04-04 NOTE — ED Triage Notes (Signed)
Pt presents POV from home, ambulatory, ca/ox4  Pt c/o sore throat, nasal congestion and body aches x2 days

## 2022-04-08 ENCOUNTER — Telehealth: Payer: BLUE CROSS/BLUE SHIELD | Admitting: Nurse Practitioner

## 2022-04-08 DIAGNOSIS — J00 Acute nasopharyngitis [common cold]: Secondary | ICD-10-CM

## 2022-04-08 MED ORDER — FLUTICASONE PROPIONATE 50 MCG/ACT NA SUSP: 2.0000 | Freq: Every day | NASAL | 6 refills | Status: DC

## 2022-04-08 MED ORDER — PROMETHAZINE-DM 6.25-15 MG/5ML PO SYRP: 5.0000 mL | ORAL_SOLUTION | Freq: Four times a day (QID) | ORAL | 0 refills | Status: DC | PRN

## 2022-04-08 NOTE — Progress Notes (Signed)

## 2022-04-10 ENCOUNTER — Telehealth: Payer: BLUE CROSS/BLUE SHIELD | Admitting: Physician Assistant

## 2022-04-10 ENCOUNTER — Ambulatory Visit (INDEPENDENT_AMBULATORY_CARE_PROVIDER_SITE_OTHER): Payer: BLUE CROSS/BLUE SHIELD | Admitting: Internal Medicine

## 2022-04-10 ENCOUNTER — Encounter (INDEPENDENT_AMBULATORY_CARE_PROVIDER_SITE_OTHER): Payer: Self-pay | Admitting: Internal Medicine

## 2022-04-10 VITALS — BP 149/86 | HR 72 | Temp 98.2°F | Ht 67.0 in | Wt 374.0 lb

## 2022-04-10 DIAGNOSIS — R7303 Prediabetes: Secondary | ICD-10-CM | POA: Diagnosis not present

## 2022-04-10 DIAGNOSIS — Z1331 Encounter for screening for depression: Secondary | ICD-10-CM

## 2022-04-10 DIAGNOSIS — R0602 Shortness of breath: Secondary | ICD-10-CM | POA: Diagnosis not present

## 2022-04-10 DIAGNOSIS — R5383 Other fatigue: Secondary | ICD-10-CM | POA: Insufficient documentation

## 2022-04-10 DIAGNOSIS — B9689 Other specified bacterial agents as the cause of diseases classified elsewhere: Secondary | ICD-10-CM

## 2022-04-10 DIAGNOSIS — Z6841 Body Mass Index (BMI) 40.0 and over, adult: Secondary | ICD-10-CM

## 2022-04-10 DIAGNOSIS — R030 Elevated blood-pressure reading, without diagnosis of hypertension: Secondary | ICD-10-CM | POA: Diagnosis not present

## 2022-04-10 DIAGNOSIS — R29818 Other symptoms and signs involving the nervous system: Secondary | ICD-10-CM | POA: Diagnosis not present

## 2022-04-10 MED ORDER — AMOXICILLIN 500 MG PO CAPS: 500.0000 mg | ORAL_CAPSULE | Freq: Two times a day (BID) | ORAL | 0 refills | Status: AC

## 2022-04-10 NOTE — Progress Notes (Signed)
E-Visit for Sore Throat   We are sorry that you are not feeling well.  Here is how we plan to help!  Based on what you have shared with me it is likely that you have bacterial pharyngitis.  Bacterial pharyngitis is inflammation and infection in the back of the throat.  This is an infection cause by bacteria and is treated with antibiotics.  I have prescribed Amoxicillin 500 mg twice a day for 10 days. For throat pain, we recommend over the counter oral pain relief medications such as acetaminophen or aspirin, or anti-inflammatory medications such as ibuprofen or naproxen sodium. Topical treatments such as oral throat lozenges or sprays may be used as needed. Bacterial throat infections are not as easily transmitted as other respiratory infections, however we still recommend that you avoid close contact with loved ones, especially the very young and elderly.  Remember to wash your hands thoroughly throughout the day as this is the number one way to prevent the spread of infection and wipe down door knobs and counters with disinfectant.   Home Care: Only take medications as instructed by your medical team. Complete the entire course of an antibiotic. Do not take these medications with alcohol. A steam or ultrasonic humidifier can help congestion.  You can place a towel over your head and breathe in the steam from hot water coming from a faucet. Avoid close contacts especially the very young and the elderly. Cover your mouth when you cough or sneeze. Always remember to wash your hands.  Get Help Right Away If: You develop worsening fever or sinus pain. You develop a severe head ache or visual changes. Your symptoms persist after you have completed your treatment plan.  Make sure you Understand these instructions. Will watch your condition. Will get help right away if you are not doing well or get worse.   Thank you for choosing an e-visit.  Your e-visit answers were reviewed by a board  certified advanced clinical practitioner to complete your personal care plan. Depending upon the condition, your plan could have included both over the counter or prescription medications.  Please review your pharmacy choice. Make sure the pharmacy is open so you can pick up prescription now. If there is a problem, you may contact your provider through [employer] and have the prescription routed to another pharmacy.  Your safety is important to [place]. If you have drug allergies check your prescription carefully.   For the next 24 hours you can use MyChart to ask questions about today's visit, request a non-urgent call back, or ask for a work or school excuse. You will get an email in the next two days asking about your experience. I hope that your e-visit has been valuable and will speed your recovery.  I provided 5 minutes of non face-to-face time during this encounter for chart review and documentation.

## 2022-04-11 DIAGNOSIS — R7303 Prediabetes: Secondary | ICD-10-CM | POA: Insufficient documentation

## 2022-04-11 LAB — CBC WITH DIFFERENTIAL
Basophils Absolute: 0 10*3/uL (ref 0.0–0.2)
Basos: 1 %
EOS (ABSOLUTE): 0.1 10*3/uL (ref 0.0–0.4)
Eos: 2 %
Hematocrit: 38.9 % (ref 34.0–46.6)
Hemoglobin: 12.1 g/dL (ref 11.1–15.9)
Immature Grans (Abs): 0 10*3/uL (ref 0.0–0.1)
Immature Granulocytes: 0 %
Lymphocytes Absolute: 3 10*3/uL (ref 0.7–3.1)
Lymphs: 43 %
MCH: 23.8 pg — ABNORMAL LOW (ref 26.6–33.0)
MCHC: 31.1 g/dL — ABNORMAL LOW (ref 31.5–35.7)
MCV: 77 fL — ABNORMAL LOW (ref 79–97)
Monocytes Absolute: 0.4 10*3/uL (ref 0.1–0.9)
Monocytes: 5 %
Neutrophils Absolute: 3.4 10*3/uL (ref 1.4–7.0)
Neutrophils: 49 %
RBC: 5.08 x10E6/uL (ref 3.77–5.28)
RDW: 16 % — ABNORMAL HIGH (ref 11.7–15.4)
WBC: 7 10*3/uL (ref 3.4–10.8)

## 2022-04-11 LAB — CMP14+EGFR
ALT: 17 IU/L (ref 0–32)
AST: 17 IU/L (ref 0–40)
Albumin/Globulin Ratio: 1.3 (ref 1.2–2.2)
Albumin: 3.8 g/dL — ABNORMAL LOW (ref 3.9–4.9)
Alkaline Phosphatase: 82 IU/L (ref 44–121)
BUN/Creatinine Ratio: 12 (ref 9–23)
BUN: 9 mg/dL (ref 6–20)
Bilirubin Total: 0.3 mg/dL (ref 0.0–1.2)
CO2: 22 mmol/L (ref 20–29)
Calcium: 9 mg/dL (ref 8.7–10.2)
Chloride: 102 mmol/L (ref 96–106)
Creatinine, Ser: 0.75 mg/dL (ref 0.57–1.00)
Globulin, Total: 2.9 g/dL (ref 1.5–4.5)
Glucose: 81 mg/dL (ref 70–99)
Potassium: 4.2 mmol/L (ref 3.5–5.2)
Sodium: 138 mmol/L (ref 134–144)
Total Protein: 6.7 g/dL (ref 6.0–8.5)
eGFR: 104 mL/min/{1.73_m2} (ref >59)

## 2022-04-11 LAB — LIPID PANEL WITH LDL/HDL RATIO
Cholesterol, Total: 157 mg/dL (ref 100–199)
HDL: 54 mg/dL (ref >39)
LDL Chol Calc (NIH): 81 mg/dL (ref 0–99)
LDL/HDL Ratio: 1.5 ratio (ref 0.0–3.2)
Triglycerides: 123 mg/dL (ref 0–149)
VLDL Cholesterol Cal: 22 mg/dL (ref 5–40)

## 2022-04-11 LAB — VITAMIN B12: Vitamin B-12: 453 pg/mL (ref 232–1245)

## 2022-04-11 LAB — VITAMIN D 25 HYDROXY (VIT D DEFICIENCY, FRACTURES): Vit D, 25-Hydroxy: 6.7 ng/mL — ABNORMAL LOW (ref 30.0–100.0)

## 2022-04-11 LAB — TSH: TSH: 1.78 u[IU]/mL (ref 0.450–4.500)

## 2022-04-11 LAB — HEMOGLOBIN A1C
Est. average glucose Bld gHb Est-mCnc: 128 mg/dL
Hgb A1c MFr Bld: 6.1 % — ABNORMAL HIGH (ref 4.8–5.6)

## 2022-04-11 LAB — INSULIN, RANDOM: INSULIN: 9.9 u[IU]/mL (ref 2.6–24.9)

## 2022-04-18 NOTE — Progress Notes (Unsigned)
Chief Complaint:   OBESITY Lindsey Figueroa (MR# [id number]) is a 36 y.o. female who presents for evaluation and treatment of obesity and related comorbidities. Current BMI is Body mass index is 58.58 kg/m. Lindsey Figueroa has been struggling with her weight for many years and has been unsuccessful in either losing weight, maintaining weight loss, or reaching her healthy weight goal.  Lindsey Figueroa had lost weight while in college due to increased physical activity.  She has good family support.  She has had problems with weight loss and maintaining her weight.  She does not cook at night if she is tired.  She had a consultation for gastric bypass in the past.  She is currently eating 1 meal a day.  PA none.  Lindsey Figueroa is currently in the action stage of change and ready to dedicate time achieving and maintaining a healthier weight. Lindsey Figueroa is interested in becoming our patient and working on intensive lifestyle modifications including (but not limited to) diet and exercise for weight loss.  Lindsey Figueroa's habits were reviewed today and are as follows: Her family eats meals together, she thinks her family will eat healthier with her, her desired weight loss is 124 lbs, she has been heavy most of her life, she started gaining weight Lindsey Figueroa 10 years ago, her heaviest weight ever was 385 pounds, she has significant food cravings issues, she skips meals frequently, she is frequently drinking liquids with calories, she frequently makes poor food choices, she has problems with excessive hunger, she frequently eats larger portions than normal, and she struggles with emotional eating.  Depression Screen Lindsey Figueroa's Food and Mood (modified PHQ-9) score was 19.     04/10/2022    9:12 AM  Depression screen PHQ 2/9  Decreased Interest 3  Down, Depressed, Hopeless 3  PHQ - 2 Score 6  Altered sleeping 1  Tired, decreased energy 3  Change in appetite 3  Feeling bad or failure about yourself  3  Trouble concentrating 2   Moving slowly or fidgety/restless 1  Suicidal thoughts 0  PHQ-9 Score 19  Difficult doing work/chores Very difficult   Subjective:   1. Other fatigue Lindsey Figueroa admits to daytime somnolence and admits to waking up still tired. Patient has a history of symptoms of daytime fatigue, morning fatigue, and morning headache. Lindsey Figueroa generally gets 4 hours of sleep per night, and states that she has nightime awakenings. Snoring is present. Apneic episodes are present. Epworth Sleepiness Score is 8.   2. SOBOE (shortness of breath on exertion) Lindsey Figueroa notes increasing shortness of breath with exercising and seems to be worsening over time with weight gain. She notes getting out of breath sooner with activity than she used to. This has not gotten worse recently. Lindsey Figueroa denies shortness of breath at rest or orthopnea.  3. Elevated blood pressure reading Lindsey Figueroa has a new diagnosis. She was counseled on risks and goal of level.  She was on hydrochlorothiazide as she may have hypertension.  4. Prediabetes Lindsey Figueroa's diagnosis is based on an A1c of 6.2 and elevated fasting blood glucose.  Asymptomatic.  Counseled on risks and progression.  I discussed labs with the patient today.  Assessment/Plan:   1. Other fatigue Lindsey Figueroa does feel that her weight is causing her energy to be lower than it should be. Fatigue may be related to obesity, depression or many other causes. Labs will be ordered, and in the meanwhile, Lindsey Figueroa will focus on self care including making healthy food choices, increasing physical activity and  focusing on stress reduction.  - CBC With Differential - CMP14+EGFR - Lipid Panel With LDL/HDL Ratio - TSH - Vitamin B12 - VITAMIN D 25 Hydroxy (Vit-D Deficiency, Fractures)  2. SOBOE (shortness of breath on exertion) Lindsey Figueroa does feel that she gets out of breath more easily that she used to when she exercises. Lindsey Figueroa's shortness of breath appears to be obesity related and exercise induced. She has  agreed to work on weight loss and gradually increase exercise to treat her exercise induced shortness of breath. Will continue to monitor closely.  3. Elevated blood pressure reading Lindsey Figueroa is to monitor her blood pressure and keep a home log and bring her log into her next office visit.  We will consider restarting hydrochlorothiazide or CCB.    4. Prediabetes We will check labs today.  Reduce calorie, low carbohydrate, high-protein meal plan was given.  - Hemoglobin A1c - Insulin, random  5. Depression screening Lindsey Figueroa had a positive depression screening. Depression is commonly associated with obesity and often results in emotional eating behaviors. We will monitor this closely and work on CBT to help improve the non-hunger eating patterns. Referral to Psychology may be required if no improvement is seen as she continues in our clinic.  6. Class 3 severe obesity with serious comorbidity and body mass index (BMI) of 50.0 to 59.9 in adult, unspecified obesity type (HCC) Lindsey Figueroa is currently in the action stage of change and her goal is to continue with weight loss efforts. I recommend Lindsey Figueroa begin the structured treatment plan as follows:  She has agreed to the Category 3 Plan.  Exercise goals: No exercise has been prescribed at this time.   Behavioral modification strategies: increasing lean protein intake, increasing water intake, decreasing liquid calories, decreasing eating out, no skipping meals, keeping healthy foods in the home, better snacking choices, and planning for success.  She was informed of the importance of frequent follow-up visits to maximize her success with intensive lifestyle modifications for her multiple health conditions. She was informed we would discuss her lab results at her next visit unless there is a critical issue that needs to be addressed sooner. Lindsey Figueroa agreed to keep her next visit at the agreed upon time to discuss these results.  Objective:   Blood  pressure (!) 149/86, pulse 72, temperature 98.2 F (36.8 C), height _0  (1.702 m), weight (!) 374 lb (169.6 kg), SpO2 99 %. Body mass index is 58.58 kg/m.  EKG: Normal sinus rhythm, rate 72 BPM.  Indirect Calorimeter completed today shows a VO2 of 293 and a REE of 2016.  Her calculated basal metabolic rate is 3785 thus her basal metabolic rate is worse than expected.  General: Cooperative, alert, well developed, in no acute distress. HEENT: Conjunctivae and lids unremarkable. Cardiovascular: Regular rhythm.  Lungs: Normal work of breathing. Neurologic: No focal deficits.   Lab Results  Component Value Date   CREATININE 0.75 04/10/2022   BUN 9 04/10/2022   NA 138 04/10/2022   K 4.2 04/10/2022   CL 102 04/10/2022   CO2 22 04/10/2022   Lab Results  Component Value Date   ALT 17 04/10/2022   AST 17 04/10/2022   ALKPHOS 82 04/10/2022   BILITOT 0.3 04/10/2022   Lab Results  Component Value Date   HGBA1C 6.1 (H) 04/10/2022   HGBA1C 6.2 02/17/2022   HGBA1C 6.0 07/24/2011   Lab Results  Component Value Date   INSULIN 9.9 04/10/2022   INSULIN 14.9 10/12/2020   Lab Results  Component Value Date   TSH 1.780 04/10/2022   Lab Results  Component Value Date   CHOL 157 04/10/2022   HDL 54 04/10/2022   LDLCALC 81 04/10/2022   TRIG 123 04/10/2022   CHOLHDL 2 02/17/2022   Lab Results  Component Value Date   WBC 7.0 04/10/2022   HGB 12.1 04/10/2022   HCT 38.9 04/10/2022   MCV 77 (L) 04/10/2022   PLT 236.0 02/17/2022   No results found for: "IRON", "TIBC", "FERRITIN"  Attestation Statements:   Reviewed by clinician on day of visit: allergies, medications, problem list, medical history, surgical history, family history, social history, and previous encounter notes.  Time spent on visit including pre-visit chart review and post-visit charting and care was 40 minutes.   I, Lindsey Figueroa, am acting as transcriptionist for Thomes Dinning, MD.  I have reviewed the  above documentation for accuracy and completeness, and I agree with the above. - ***

## 2022-04-24 ENCOUNTER — Encounter (INDEPENDENT_AMBULATORY_CARE_PROVIDER_SITE_OTHER): Payer: Self-pay | Admitting: Internal Medicine

## 2022-04-24 ENCOUNTER — Ambulatory Visit (INDEPENDENT_AMBULATORY_CARE_PROVIDER_SITE_OTHER): Payer: BLUE CROSS/BLUE SHIELD | Admitting: Internal Medicine

## 2022-04-24 VITALS — BP 152/88 | HR 95 | Temp 98.3°F | Ht 67.0 in | Wt 373.6 lb

## 2022-04-24 DIAGNOSIS — E669 Obesity, unspecified: Secondary | ICD-10-CM

## 2022-04-24 DIAGNOSIS — R718 Other abnormality of red blood cells: Secondary | ICD-10-CM | POA: Diagnosis not present

## 2022-04-24 DIAGNOSIS — Z6841 Body Mass Index (BMI) 40.0 and over, adult: Secondary | ICD-10-CM

## 2022-04-24 DIAGNOSIS — R030 Elevated blood-pressure reading, without diagnosis of hypertension: Secondary | ICD-10-CM | POA: Diagnosis not present

## 2022-04-24 DIAGNOSIS — E559 Vitamin D deficiency, unspecified: Secondary | ICD-10-CM | POA: Diagnosis not present

## 2022-04-24 DIAGNOSIS — R7303 Prediabetes: Secondary | ICD-10-CM

## 2022-04-24 MED ORDER — METFORMIN HCL 500 MG PO TABS: 500.0000 mg | ORAL_TABLET | Freq: Every day | ORAL | 0 refills | Status: DC

## 2022-04-24 MED ORDER — VITAMIN D (ERGOCALCIFEROL) 1.25 MG (50000 UNIT) PO CAPS: 50000.0000 [IU] | ORAL_CAPSULE | ORAL | 1 refills | Status: DC

## 2022-05-02 NOTE — Progress Notes (Signed)
Chief Complaint:   OBESITY Lindsey Figueroa is here to discuss her progress with her obesity treatment plan along with follow-up of her obesity related diagnoses. Lindsey Figueroa is on the Category 3 Plan and states she is following her eating plan approximately 90% of the time. Lindsey Figueroa states she is doing 0 minutes 0 times per week.  Today's visit was #: 2 Starting weight: 374 lbs Starting date: 04/10/2022 Today's weight: 373 lbs Today's date: 04/24/2022 Total lbs lost to date: 1 Total lbs lost since last in-office visit: 1  Interim History: Lindsey Figueroa has no problems with her meal plan other protein content. She feels it is too much protein. She notes adequate appetite and hunger controls. No physical activity has been started. She has been on the meal plan for 1 week.  Subjective:   1. Prediabetes Lindsey Figueroa's A1c is 6.1. Reviewed the disease states and risk for progression. I discussed labs with patient today.   2. Elevated blood pressure reading Lindsey Figueroa's blood pressure was recheck at 120/70, forearm.   3. Vitamin D deficiency Lindsey Figueroa's Vitamin D level is 6.7, associated with adiposity, leptin resistance, adipogenesis, and fatigue. I discussed labs with the patient today.   4. Low mean corpuscular volume (MCV) Lindsey Figueroa's hemoglobin is within normal limits. She wants to hold off on evaluation. I discussed labs with the patient today.   Assessment/Plan:   1. Prediabetes Lindsey Figueroa agreed to start metformin 500 mg daily with no refills. She will continue her weight loss therapy with a goal of 10% weight loss in 3-4 months. Reviewed medication side effects.   - metFORMIN (GLUCOPHAGE) 500 MG tablet; Take 1 tablet (500 mg total) by mouth daily with breakfast.  Dispense: 30 tablet; Refill: 0  2. Elevated blood pressure reading Lindsey Figueroa will continue with weight loss therapy, and monitoring at home with appropriate cuff.  She had been on medications in the past but stopped taking them.  If we see that her blood  pressure is above goal treatment will be initiated where she continues to work on weight loss.  3. Vitamin D deficiency Lindsey Figueroa agreed to start Vitamin D 50,000 IU once weekly with 1 refill for 4 months. We will recheck labs in 6 months.   - Vitamin D, Ergocalciferol, (DRISDOL) 1.25 MG (50000 UNIT) CAPS capsule; Take 1 capsule (50,000 Units total) by mouth every 7 (seven) days.  Dispense: 5 capsule; Refill: 1  4. Low mean corpuscular volume (MCV) We will recheck iron profile at her next office visit with hemoglobin electrophoresis.   5. Obesity, current BMI 58.5 Lindsey Figueroa is currently in the action stage of change. As such, her goal is to continue with weight loss efforts. She has agreed to the Category 3 Plan.   Patient was given protein options to split dinner. Continue current plan.   Exercise goals: Increase light walking to 10 minutes 3-4 times a week.   Behavioral modification strategies: increasing water intake, no skipping meals, meal planning and cooking strategies, and keeping healthy foods in the home.  Lindsey Figueroa has agreed to follow-up with our clinic in 2 weeks. She was informed of the importance of frequent follow-up visits to maximize her success with intensive lifestyle modifications for her multiple health conditions.   Objective:   Blood pressure (!) 152/88, pulse 95, temperature 98.3 F (36.8 C), weight (!) 373 lb 9.6 oz (169.5 kg), SpO2 (!) 9 %. Body mass index is 58.51 kg/m.  General: Cooperative, alert, well developed, in no acute distress. HEENT: Conjunctivae and lids unremarkable.  Cardiovascular: Regular rhythm.  Lungs: Normal work of breathing. Neurologic: No focal deficits.   Lab Results  Component Value Date   CREATININE 0.75 04/10/2022   BUN 9 04/10/2022   NA 138 04/10/2022   K 4.2 04/10/2022   CL 102 04/10/2022   CO2 22 04/10/2022   Lab Results  Component Value Date   ALT 17 04/10/2022   AST 17 04/10/2022   ALKPHOS 82 04/10/2022   BILITOT 0.3  04/10/2022   Lab Results  Component Value Date   HGBA1C 6.1 (H) 04/10/2022   HGBA1C 6.2 02/17/2022   HGBA1C 6.0 07/24/2011   Lab Results  Component Value Date   INSULIN 9.9 04/10/2022   INSULIN 14.9 10/12/2020   Lab Results  Component Value Date   TSH 1.780 04/10/2022   Lab Results  Component Value Date   CHOL 157 04/10/2022   HDL 54 04/10/2022   LDLCALC 81 04/10/2022   TRIG 123 04/10/2022   CHOLHDL 2 02/17/2022   Lab Results  Component Value Date   VD25OH 6.7 (L) 04/10/2022   VD25OH <7.00 (L) 02/17/2022   Lab Results  Component Value Date   WBC 7.0 04/10/2022   HGB 12.1 04/10/2022   HCT 38.9 04/10/2022   MCV 77 (L) 04/10/2022   PLT 236.0 02/17/2022   No results found for: "IRON", "TIBC", "FERRITIN"  Attestation Statements:   Reviewed by clinician on day of visit: allergies, medications, problem list, medical history, surgical history, family history, social history, and previous encounter notes.  Time spent on visit including pre-visit chart review and post-visit care and charting was 25 minutes.   Wilhemena Durie, am acting as transcriptionist for Thomes Dinning, MD.  I have reviewed the above documentation for accuracy and completeness, and I agree with the above. -Thomes Dinning, MD

## 2022-05-08 ENCOUNTER — Ambulatory Visit (INDEPENDENT_AMBULATORY_CARE_PROVIDER_SITE_OTHER): Payer: BLUE CROSS/BLUE SHIELD | Admitting: Internal Medicine

## 2022-05-08 ENCOUNTER — Telehealth: Payer: Self-pay | Admitting: Family Medicine

## 2022-05-08 ENCOUNTER — Encounter (INDEPENDENT_AMBULATORY_CARE_PROVIDER_SITE_OTHER): Payer: Self-pay | Admitting: Internal Medicine

## 2022-05-08 ENCOUNTER — Other Ambulatory Visit: Payer: Self-pay | Admitting: Family Medicine

## 2022-05-08 VITALS — BP 148/100 | HR 65 | Temp 97.4°F | Ht 67.0 in | Wt 371.2 lb

## 2022-05-08 DIAGNOSIS — E88819 Insulin resistance, unspecified: Secondary | ICD-10-CM

## 2022-05-08 DIAGNOSIS — I10 Essential (primary) hypertension: Secondary | ICD-10-CM

## 2022-05-08 DIAGNOSIS — R29818 Other symptoms and signs involving the nervous system: Secondary | ICD-10-CM | POA: Insufficient documentation

## 2022-05-08 DIAGNOSIS — Z6841 Body Mass Index (BMI) 40.0 and over, adult: Secondary | ICD-10-CM

## 2022-05-08 DIAGNOSIS — E669 Obesity, unspecified: Secondary | ICD-10-CM

## 2022-05-08 DIAGNOSIS — G4700 Insomnia, unspecified: Secondary | ICD-10-CM

## 2022-05-08 DIAGNOSIS — E559 Vitamin D deficiency, unspecified: Secondary | ICD-10-CM | POA: Diagnosis not present

## 2022-05-08 MED ORDER — AMLODIPINE BESYLATE 2.5 MG PO TABS: 2.5000 mg | ORAL_TABLET | Freq: Every day | ORAL | 0 refills | Status: DC

## 2022-05-08 MED ORDER — VITAMIN D (ERGOCALCIFEROL) 1.25 MG (50000 UNIT) PO CAPS: 50000.0000 [IU] | ORAL_CAPSULE | ORAL | 0 refills | Status: DC

## 2022-05-08 NOTE — Telephone Encounter (Signed)
Lvm for patient to schedule in 1-2 weeks per [facility]

## 2022-05-08 NOTE — Telephone Encounter (Signed)
-----   Message from Ann Held, DO sent at 05/08/2022  2:07 PM EDT ----- Pt needs ov for f/u bp

## 2022-05-15 ENCOUNTER — Encounter: Payer: Self-pay | Admitting: Family Medicine

## 2022-05-15 ENCOUNTER — Ambulatory Visit: Payer: BLUE CROSS/BLUE SHIELD | Admitting: Family Medicine

## 2022-05-15 ENCOUNTER — Other Ambulatory Visit (HOSPITAL_BASED_OUTPATIENT_CLINIC_OR_DEPARTMENT_OTHER): Payer: Self-pay

## 2022-05-15 VITALS — BP 130/100 | HR 70 | Temp 97.6°F | Resp 18 | Ht 67.0 in | Wt 374.6 lb

## 2022-05-15 DIAGNOSIS — I10 Essential (primary) hypertension: Secondary | ICD-10-CM | POA: Diagnosis not present

## 2022-05-15 DIAGNOSIS — Z6841 Body Mass Index (BMI) 40.0 and over, adult: Secondary | ICD-10-CM | POA: Diagnosis not present

## 2022-05-15 DIAGNOSIS — Z23 Encounter for immunization: Secondary | ICD-10-CM

## 2022-05-15 MED ORDER — WEGOVY 0.25 MG/0.5ML ~~LOC~~ SOAJ
0.2500 mg | SUBCUTANEOUS | 0 refills | Status: DC
  Filled 2022-05-15: qty 2, 28d supply, fill #0

## 2022-05-15 MED ORDER — AMLODIPINE BESYLATE 5 MG PO TABS: 5.0000 mg | ORAL_TABLET | Freq: Every day | ORAL | 1 refills | Status: DC

## 2022-05-15 NOTE — Assessment & Plan Note (Signed)
con't [facility] wegovy rx

## 2022-05-15 NOTE — Progress Notes (Addendum)
Subjective:   By signing my name below, I, Carylon Perches, attest that this documentation has been prepared under the direction and in the presence of Ann Held DO 05/15/2022    Patient ID: Lindsey Figueroa, female    DOB: [birth year], 36 y.o.   MRN: [id number]  Chief Complaint  Patient presents with   Hypertension    Pt bp at [place] was high. She states the blood pressure was around 169/100 and has been around 150ish range. Pt states recently starting bp medications.   Follow-up    HPI Patient is in today for an office visit   She reports that her blood pressure is elevated at the [facility] location. She has not been cooking with salts at home because she is following the meal plans. She states that since going to [facility], she has lost about 4 lbs. She meal preps and consumes premiere protein shakes occasionally. She was prescribed 2.5 Mg of Amlodipine by Dr. Gerarda Fraction. She is scheduled for an appointment with Dr. Gerarda Fraction on 05/22/2022 BP Readings from Last 3 Encounters:  05/15/22 (!) 130/100  05/08/22 (!) 148/100  04/24/22 (!) 152/88   Pulse Readings from Last 3 Encounters:  05/15/22 70  05/08/22 65  04/24/22 95   She has tried Wegovy in the past however, the order was on backorder. Lab Results  Component Value Date   HGBA1C 6.1 (H) 04/10/2022   She is requesting to alleviate her inflammation symptoms. She has pain specifically in her knees. She has been taking Tumeric supplements and teas. She has tried Tylenol Arthritis.   She is interested in receiving an influenza vaccine during today's visit.   Past Medical History:  Diagnosis Date   Anxiety    Back pain    Bilateral swelling of feet and ankles    Chewing difficulty    Depression    Edema    GERD (gastroesophageal reflux disease)    Hypertension    Joint pain    Lactose intolerance    Other fatigue    Palpitations    Prediabetes    Sciatica    Shortness of breath  on exertion    STD (sexually transmitted disease)    Chlamydia   Swallowing difficulty     Past Surgical History:  Procedure Laterality Date   TONSILLECTOMY AND ADENOIDECTOMY     WISDOM TOOTH EXTRACTION  2018    Family History  Problem Relation Age of Onset   Diabetes Mother    Depression Mother    Sleep apnea Mother    Hypertension Father    Hypertension Maternal Grandmother    Cancer - Lung Maternal Grandfather        Non smoker    Social History   Socioeconomic History   Marital status: Single    Spouse name: Not on file   Number of children: Not on file   Years of education: Not on file   Highest education level: Not on file  Occupational History   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: [employer]    Comment: [employer]  Tobacco Use   Smoking status: Never   Smokeless tobacco: Never  Vaping Use   Vaping Use: Never used  Substance and Sexual Activity   Alcohol use: No   Drug use: No   Sexual activity: Yes    Partners: Male    Birth control/protection: Condom    Comment: 1st intercourse 36 yo-More than 5 partners  Other Topics Concern   Not on file  Social History Narrative   Trainer at gym 4x a week   Social Determinants of Health   Financial Resource Strain: Not on file  Food Insecurity: Not on file  Transportation Needs: Not on file  Physical Activity: Not on file  Stress: Not on file  Social Connections: Not on file  Intimate Partner Violence: Not on file    Outpatient Medications Prior to Visit  Medication Sig Dispense Refill   ibuprofen (ADVIL) 800 MG tablet Take 1 tablet (800 mg total) by mouth 3 (three) times daily. 21 tablet 0   metFORMIN (GLUCOPHAGE) 500 MG tablet Take 1 tablet (500 mg total) by mouth daily with breakfast. 30 tablet 0   Vitamin D, Ergocalciferol, (DRISDOL) 1.25 MG (50000 UNIT) CAPS capsule Take 1 capsule (50,000 Units total) by mouth every 7 (seven) days. 13 capsule 0   amLODipine (NORVASC) 2.5 MG tablet Take 1 tablet  (2.5 mg total) by mouth daily. 30 tablet 0   promethazine-dextromethorphan (PROMETHAZINE-DM) 6.25-15 MG/5ML syrup Take 5 mLs by mouth 4 (four) times daily as needed for cough. (Patient not taking: Reported on 05/15/2022) 118 mL 0   No facility-administered medications prior to visit.    No Known Allergies  Review of Systems  Constitutional:  Negative for fever and malaise/fatigue.  HENT:  Negative for congestion.   Eyes:  Negative for blurred vision.  Respiratory:  Negative for shortness of breath.   Cardiovascular:  Negative for chest pain, palpitations and leg swelling.  Gastrointestinal:  Negative for abdominal pain, blood in stool and nausea.  Genitourinary:  Negative for dysuria and frequency.  Musculoskeletal:  Negative for falls.  Skin:  Negative for rash.  Neurological:  Negative for dizziness, loss of consciousness and headaches.  Endo/Heme/Allergies:  Negative for environmental allergies.  Psychiatric/Behavioral:  Negative for depression. The patient is not nervous/anxious.        Objective:    Physical Exam Vitals and nursing note reviewed.  Constitutional:      General: She is not in acute distress.    Appearance: Normal appearance. She is not ill-appearing.  HENT:     Head: Normocephalic and atraumatic.     Right Ear: External ear normal.     Left Ear: External ear normal.  Eyes:     Extraocular Movements: Extraocular movements intact.     Pupils: Pupils are equal, round, and reactive to light.  Cardiovascular:     Rate and Rhythm: Normal rate and regular rhythm.     Heart sounds: Normal heart sounds. No murmur heard.    No gallop.  Pulmonary:     Effort: Pulmonary effort is normal. No respiratory distress.     Breath sounds: Normal breath sounds. No wheezing or rales.  Skin:    General: Skin is warm and dry.  Neurological:     Mental Status: She is alert and oriented to person, place, and time.  Psychiatric:        Judgment: Judgment normal.     BP  (!) 130/100 (BP Location: Left Arm, Patient Position: Sitting, Cuff Size: Large)   Pulse 70   Temp 97.6 F (36.4 C) (Oral)   Resp 18   Ht _0  (1.702 m)   Wt (!) 374 lb 9.6 oz (169.9 kg)   SpO2 97%   BMI 58.67 kg/m  Wt Readings from Last 3 Encounters:  05/15/22 (!) 374 lb 9.6 oz (169.9 kg)  05/08/22 (!) 371 lb 3.2 oz (168.4  kg)  04/24/22 (!) 373 lb 9.6 oz (169.5 kg)    Diabetic Foot Exam - Simple   No data filed    Lab Results  Component Value Date   WBC 7.0 04/10/2022   HGB 12.1 04/10/2022   HCT 38.9 04/10/2022   PLT 236.0 02/17/2022   GLUCOSE 81 04/10/2022   CHOL 157 04/10/2022   TRIG 123 04/10/2022   HDL 54 04/10/2022   LDLCALC 81 04/10/2022   ALT 17 04/10/2022   AST 17 04/10/2022   NA 138 04/10/2022   K 4.2 04/10/2022   CL 102 04/10/2022   CREATININE 0.75 04/10/2022   BUN 9 04/10/2022   CO2 22 04/10/2022   TSH 1.780 04/10/2022   HGBA1C 6.1 (H) 04/10/2022    Lab Results  Component Value Date   TSH 1.780 04/10/2022   Lab Results  Component Value Date   WBC 7.0 04/10/2022   HGB 12.1 04/10/2022   HCT 38.9 04/10/2022   MCV 77 (L) 04/10/2022   PLT 236.0 02/17/2022   Lab Results  Component Value Date   NA 138 04/10/2022   K 4.2 04/10/2022   CO2 22 04/10/2022   GLUCOSE 81 04/10/2022   BUN 9 04/10/2022   CREATININE 0.75 04/10/2022   BILITOT 0.3 04/10/2022   ALKPHOS 82 04/10/2022   AST 17 04/10/2022   ALT 17 04/10/2022   PROT 6.7 04/10/2022   ALBUMIN 3.8 (L) 04/10/2022   CALCIUM 9.0 04/10/2022   ANIONGAP 9 03/17/2020   EGFR 104 04/10/2022   GFR 109.76 02/17/2022   Lab Results  Component Value Date   CHOL 157 04/10/2022   Lab Results  Component Value Date   HDL 54 04/10/2022   Lab Results  Component Value Date   LDLCALC 81 04/10/2022   Lab Results  Component Value Date   TRIG 123 04/10/2022   Lab Results  Component Value Date   CHOLHDL 2 02/17/2022   Lab Results  Component Value Date   HGBA1C 6.1 (H) 04/10/2022        Assessment & Plan:   Problem List Items Addressed This Visit       Unprioritized   Hypertension, essential - Primary   Relevant Medications   amLODipine (NORVASC) 5 MG tablet   Primary hypertension    Well controlled, no changes to meds. Encouraged heart healthy diet such as the DASH diet and exercise as tolerated.       Relevant Medications   amLODipine (NORVASC) 5 MG tablet   Class 3 severe obesity without serious comorbidity with body mass index (BMI) of 50.0 to 59.9 in adult ([place])    con't [facility] wegovy rx        Relevant Medications   Semaglutide-Weight Management (WEGOVY) 0.25 MG/0.5ML SOAJ   Other Visit Diagnoses     Need for influenza vaccination       Relevant Orders   Flu Vaccine QUAD 67moIM (Fluarix, Fluzone & Alfiuria Quad PF)      Meds ordered this encounter  Medications   amLODipine (NORVASC) 5 MG tablet    Sig: Take 1 tablet (5 mg total) by mouth daily.    Dispense:  90 tablet    Refill:  1   Semaglutide-Weight Management (WEGOVY) 0.25 MG/0.5ML SOAJ    Sig: Inject 0.25 mg into the skin once a week.    Dispense:  2 mL    Refill:  0    I, YAnn Held DO, personally preformed the services described in this documentation.  All medical record entries made by the scribe were at my direction and in my presence.  I have reviewed the chart and discharge instructions (if applicable) and agree that the record reflects my personal performance and is accurate and complete. 05/15/2022   I,Amber Collins,acting as a scribe for Ann Held, DO.,have documented all relevant documentation on the behalf of Ann Held, DO,as directed by  Ann Held, DO while in the presence of Ann Held, DO.    Ann Held, DO

## 2022-05-15 NOTE — Assessment & Plan Note (Signed)
Well controlled, no changes to meds. Encouraged heart healthy diet such as the DASH diet and exercise as tolerated.  °

## 2022-05-15 NOTE — Patient Instructions (Signed)

## 2022-05-16 ENCOUNTER — Other Ambulatory Visit (HOSPITAL_BASED_OUTPATIENT_CLINIC_OR_DEPARTMENT_OTHER): Payer: Self-pay

## 2022-05-16 ENCOUNTER — Telehealth: Payer: Self-pay | Admitting: *Deleted

## 2022-05-16 NOTE — Telephone Encounter (Signed)
Message from [place] 0.25MG /0.5 PEN INJCTR is not covered for this Member. For formulary alternatives, please view the Standard or [employer] Formulary, whichever this Member is covered by, at [email or web address] or call us at [phone].

## 2022-05-16 NOTE — Telephone Encounter (Signed)
Prior auth started via cover my meds.  Awaiting determination.  Key: [employer]

## 2022-05-17 ENCOUNTER — Encounter: Payer: Self-pay | Admitting: *Deleted

## 2022-05-17 NOTE — Telephone Encounter (Signed)
Mychart message sent to patient to call insurance.

## 2022-05-17 NOTE — Progress Notes (Unsigned)
Chief Complaint:   OBESITY Lindsey Figueroa is here to discuss her progress with her obesity treatment plan along with follow-up of her obesity related diagnoses. Lindsey Figueroa is on the Category 3 Plan and states she is following her eating plan approximately 80% of the time. Lindsey Figueroa states she is walking for 20 minutes 3 times per week.  Today's visit was #: 3 Starting weight: 374 lbs Starting date: 04/10/2022 Today's weight: 371 lbs Today's date: 05/08/2022 Total lbs lost to date: 3 Total lbs lost since last in-office visit: 2  Interim History: Lindsey Figueroa was on vacation and she had a lapse of 3 to 4 days, but she made some healthier choices.  She reports adequate control of her hunger signals and cravings.  She denies side effects to metformin.  Her blood pressure is elevated today.  Subjective:   1. Hypertension, essential [name]'s blood pressure is not at goal today.  She was counseled on risks.  2. Insulin resistance Lindsey Figueroa notes improved cravings and hunger signals on metformin.  She denies side effects.  3. Suspected sleep apnea Lindsey Figueroa has a new diagnosis. She notes poor sleep efficiency, daytime somnolence, and a history of loud snoring and witnessed apneas.  This may be contributing to elevated blood pressure and weight gain.  4. Vitamin D deficiency Lindsey Figueroa is taking prescription Vitamin D 50,000 Iu once weekly. She denies nausea, vomiting, or muscle weakness.   Assessment/Plan:   1. Hypertension, essential After a discussion of benefits and side effects with the patient, Lindsey Figueroa will start amlodipine 2.5 mg and it titrate to 5 mg over 2 weeks for blood pressure goal of 120/80.  I sent an E-message to Dr. Laury Axon and encouraged the patient to schedule an appointment.  2. Insulin resistance Lindsey Figueroa will continue with her weight loss therapy and metformin.  3. Suspected sleep apnea Lindsey Figueroa is to reach out to her PCP about a sleep study evaluation.  4. Vitamin D deficiency Lindsey Figueroa will  continue prescription vitamin D 50,000 IU once weekly, and we will refill for 90 days.  - Vitamin D, Ergocalciferol, (DRISDOL) 1.25 MG (50000 UNIT) CAPS capsule; Take 1 capsule (50,000 Units total) by mouth every 7 (seven) days.  Dispense: 13 capsule; Refill: 0  5. Obesity, current BMI 58.1 Lindsey Figueroa is currently in the action stage of change. As such, her goal is to continue with weight loss efforts. She has agreed to the Category 3 Plan.   Exercise goals: As is.   Behavioral modification strategies: increasing lean protein intake, increasing water intake, meal planning and cooking strategies, and planning for success.  Lindsey Figueroa has agreed to follow-up with our clinic in 2 weeks. She was informed of the importance of frequent follow-up visits to maximize her success with intensive lifestyle modifications for her multiple health conditions.   Objective:   Blood pressure (!) 148/100, pulse 65, temperature (!) 97.4 F (36.3 C), height 5\' 7"  (1.702 m), weight (!) 371 lb 3.2 oz (168.4 kg), SpO2 100 %. Body mass index is 58.14 kg/m.  General: Cooperative, alert, well developed, in no acute distress. HEENT: Conjunctivae and lids unremarkable. Cardiovascular: Regular rhythm.  Lungs: Normal work of breathing. Neurologic: No focal deficits.   Lab Results  Component Value Date   CREATININE 0.75 04/10/2022   BUN 9 04/10/2022   NA 138 04/10/2022   K 4.2 04/10/2022   CL 102 04/10/2022   CO2 22 04/10/2022   Lab Results  Component Value Date   ALT 17 04/10/2022   AST 17  04/10/2022   ALKPHOS 82 04/10/2022   BILITOT 0.3 04/10/2022   Lab Results  Component Value Date   HGBA1C 6.1 (H) 04/10/2022   HGBA1C 6.2 02/17/2022   HGBA1C 6.0 07/24/2011   Lab Results  Component Value Date   INSULIN 9.9 04/10/2022   INSULIN 14.9 10/12/2020   Lab Results  Component Value Date   TSH 1.780 04/10/2022   Lab Results  Component Value Date   CHOL 157 04/10/2022   HDL 54 04/10/2022   LDLCALC 81  04/10/2022   TRIG 123 04/10/2022   CHOLHDL 2 02/17/2022   Lab Results  Component Value Date   VD25OH 6.7 (L) 04/10/2022   VD25OH <7.00 (L) 02/17/2022   Lab Results  Component Value Date   WBC 7.0 04/10/2022   HGB 12.1 04/10/2022   HCT 38.9 04/10/2022   MCV 77 (L) 04/10/2022   PLT 236.0 02/17/2022   No results found for: "IRON", "TIBC", "FERRITIN"  Attestation Statements:   Reviewed by clinician on day of visit: allergies, medications, problem list, medical history, surgical history, family history, social history, and previous encounter notes.  Time spent on visit including pre-visit chart review and post-visit care and charting was 20 minutes.   I, Trixie Dredge, am acting as transcriptionist for Thomes Dinning, MD.  I have reviewed the above documentation for accuracy and completeness, and I agree with the above. -  ***

## 2022-05-22 ENCOUNTER — Ambulatory Visit (INDEPENDENT_AMBULATORY_CARE_PROVIDER_SITE_OTHER): Payer: BLUE CROSS/BLUE SHIELD | Admitting: Internal Medicine

## 2022-05-22 ENCOUNTER — Encounter (INDEPENDENT_AMBULATORY_CARE_PROVIDER_SITE_OTHER): Payer: Self-pay | Admitting: Internal Medicine

## 2022-05-22 ENCOUNTER — Encounter (INDEPENDENT_AMBULATORY_CARE_PROVIDER_SITE_OTHER): Payer: Self-pay

## 2022-05-22 ENCOUNTER — Other Ambulatory Visit (HOSPITAL_BASED_OUTPATIENT_CLINIC_OR_DEPARTMENT_OTHER): Payer: Self-pay

## 2022-05-22 VITALS — BP 142/86 | HR 75 | Temp 98.1°F | Ht 67.0 in | Wt 371.0 lb

## 2022-05-22 DIAGNOSIS — E669 Obesity, unspecified: Secondary | ICD-10-CM

## 2022-05-22 DIAGNOSIS — E559 Vitamin D deficiency, unspecified: Secondary | ICD-10-CM

## 2022-05-22 DIAGNOSIS — E88819 Insulin resistance, unspecified: Secondary | ICD-10-CM

## 2022-05-22 DIAGNOSIS — I10 Essential (primary) hypertension: Secondary | ICD-10-CM | POA: Diagnosis not present

## 2022-05-22 DIAGNOSIS — Z6841 Body Mass Index (BMI) 40.0 and over, adult: Secondary | ICD-10-CM

## 2022-05-22 MED ORDER — VITAMIN D (ERGOCALCIFEROL) 1.25 MG (50000 UNIT) PO CAPS
50000.0000 [IU] | ORAL_CAPSULE | ORAL | 0 refills | Status: DC
Start: 2022-05-22 — End: 2022-08-07

## 2022-05-23 ENCOUNTER — Other Ambulatory Visit (HOSPITAL_BASED_OUTPATIENT_CLINIC_OR_DEPARTMENT_OTHER): Payer: Self-pay

## 2022-06-02 ENCOUNTER — Encounter: Payer: Self-pay | Admitting: *Deleted

## 2022-06-04 ENCOUNTER — Other Ambulatory Visit (INDEPENDENT_AMBULATORY_CARE_PROVIDER_SITE_OTHER): Payer: Self-pay | Admitting: Internal Medicine

## 2022-06-04 DIAGNOSIS — I10 Essential (primary) hypertension: Secondary | ICD-10-CM

## 2022-06-04 DIAGNOSIS — R7303 Prediabetes: Secondary | ICD-10-CM

## 2022-06-05 NOTE — Progress Notes (Signed)
Chief Complaint:   OBESITY Lindsey Figueroa is here to discuss her progress with her obesity treatment plan along with follow-up of her obesity related diagnoses. Lindsey Figueroa is on the Category 3 Plan and states she is following her eating plan approximately 60% of the time. [name] states she is walking 30 minutes 2 times per week.  Today's visit was #: 4 Starting weight: 374 lbs Starting date: 04/10/2022 Today's weight: 371 lbs Today's date: 05/22/2022 Total lbs lost to date: 3 Total lbs lost since last in-office visit: 0  Interim History: [name] is having difficulties with plan implementation due to  her work schedule and lack of meal prep. She sometimes skips meals, particularly lunch. When she gets home, she is tired and does not feel like cooking. She notes improvements in appetite with Metformin and denies side effects.  Subjective:   1. Hypertension, essential BP improved but is still not at goal. Pt's PCP increased amlodipine recently.  2. Insulin resistance Pt reports improved cravings on Metformin and denies side effects.  3. Vitamin D deficiency She is currently taking prescription vitamin D 50,000 IU each week. She denies nausea, vomiting or muscle weakness.  Assessment/Plan:   1. Hypertension, essential Continue weight loss therapy, advised to continue to monitor blood pressure at home and report trends to PCP for intensification of therapy for goal of <120/80.  2. Insulin resistance Continue weight loss therapy and Metformin.  3. Vitamin D deficiency Low Vitamin D level contributes to fatigue and are associated with obesity, breast, and colon cancer. She agrees to continue to take prescription Vitamin D 50,000 IU every week and will follow-up for routine testing of Vitamin D, at least 2-3 times per year to avoid over-replacement.  Refill- Vitamin D, Ergocalciferol, (DRISDOL) 1.25 MG (50000 UNIT) CAPS capsule; Take 1 capsule (50,000 Units total) by mouth every 7 (seven) days.   Dispense: 13 capsule; Refill: 0  4. Obesity, current BMI 58.2 Lindsey Figueroa is currently in the action stage of change. As such, her goal is to continue with weight loss efforts. She has agreed to the Category 3 Plan.   Pt advised on meal plan replacement for lunch. Pt also provided with tips to prepare meals in bulk that would provide for several says. Referred to [facility] [employer] for recipe ideas.   Exercise goals:  As is  Behavioral modification strategies: increasing lean protein intake, no skipping meals, meal planning and cooking strategies, and planning for success.  Lindsey Figueroa has agreed to follow-up with our clinic in 2 weeks. She was informed of the importance of frequent follow-up visits to maximize her success with intensive lifestyle modifications for her multiple health conditions.   Objective:   Blood pressure (!) 142/86, pulse 75, temperature 98.1 F (36.7 C), height 5\' 7"  (1.702 m), weight (!) 371 lb (168.3 kg), SpO2 100 %. Body mass index is 58.11 kg/m.  General: Cooperative, alert, well developed, in no acute distress. HEENT: Conjunctivae and lids unremarkable. Cardiovascular: Regular rhythm.  Lungs: Normal work of breathing. Neurologic: No focal deficits.   Lab Results  Component Value Date   CREATININE 0.75 04/10/2022   BUN 9 04/10/2022   NA 138 04/10/2022   K 4.2 04/10/2022   CL 102 04/10/2022   CO2 22 04/10/2022   Lab Results  Component Value Date   ALT 17 04/10/2022   AST 17 04/10/2022   ALKPHOS 82 04/10/2022   BILITOT 0.3 04/10/2022   Lab Results  Component Value Date   HGBA1C 6.1 (H) 04/10/2022  HGBA1C 6.2 02/17/2022   HGBA1C 6.0 07/24/2011   Lab Results  Component Value Date   INSULIN 9.9 04/10/2022   INSULIN 14.9 10/12/2020   Lab Results  Component Value Date   TSH 1.780 04/10/2022   Lab Results  Component Value Date   CHOL 157 04/10/2022   HDL 54 04/10/2022   LDLCALC 81 04/10/2022   TRIG 123 04/10/2022   CHOLHDL 2 02/17/2022   Lab  Results  Component Value Date   VD25OH 6.7 (L) 04/10/2022   VD25OH <7.00 (L) 02/17/2022   Lab Results  Component Value Date   WBC 7.0 04/10/2022   HGB 12.1 04/10/2022   HCT 38.9 04/10/2022   MCV 77 (L) 04/10/2022   PLT 236.0 02/17/2022    Attestation Statements:   Reviewed by clinician on day of visit: allergies, medications, problem list, medical history, surgical history, family history, social history, and previous encounter notes.  Time spent on visit including pre-visit chart review and post-visit care and charting was 20 minutes.   I, Kyung Rudd, BS, CMA, am acting as transcriptionist for Worthy Rancher, MD.  I have reviewed the above documentation for accuracy and completeness, and I agree with the above. -Worthy Rancher, MD

## 2022-06-11 ENCOUNTER — Other Ambulatory Visit (INDEPENDENT_AMBULATORY_CARE_PROVIDER_SITE_OTHER): Payer: Self-pay | Admitting: Internal Medicine

## 2022-06-11 DIAGNOSIS — R7303 Prediabetes: Secondary | ICD-10-CM

## 2022-06-15 ENCOUNTER — Encounter: Payer: Self-pay | Admitting: Family Medicine

## 2022-06-15 ENCOUNTER — Ambulatory Visit (INDEPENDENT_AMBULATORY_CARE_PROVIDER_SITE_OTHER): Payer: BLUE CROSS/BLUE SHIELD | Admitting: Internal Medicine

## 2022-06-15 ENCOUNTER — Encounter (INDEPENDENT_AMBULATORY_CARE_PROVIDER_SITE_OTHER): Payer: Self-pay | Admitting: Internal Medicine

## 2022-06-15 VITALS — BP 138/86 | HR 78 | Temp 98.0°F | Ht 67.0 in | Wt 366.0 lb

## 2022-06-15 DIAGNOSIS — Z6841 Body Mass Index (BMI) 40.0 and over, adult: Secondary | ICD-10-CM

## 2022-06-15 DIAGNOSIS — R7303 Prediabetes: Secondary | ICD-10-CM

## 2022-06-15 DIAGNOSIS — E669 Obesity, unspecified: Secondary | ICD-10-CM | POA: Diagnosis not present

## 2022-06-15 DIAGNOSIS — I10 Essential (primary) hypertension: Secondary | ICD-10-CM | POA: Diagnosis not present

## 2022-06-15 DIAGNOSIS — E88819 Insulin resistance, unspecified: Secondary | ICD-10-CM | POA: Diagnosis not present

## 2022-06-15 MED ORDER — METFORMIN HCL 500 MG PO TABS: 500.0000 mg | ORAL_TABLET | Freq: Every day | ORAL | 0 refills | Status: DC

## 2022-06-16 MED ORDER — AMLODIPINE BESYLATE 5 MG PO TABS: 5.0000 mg | ORAL_TABLET | Freq: Every day | ORAL | 1 refills | Status: DC

## 2022-06-27 NOTE — Progress Notes (Signed)
Chief Complaint:   OBESITY Lindsey Figueroa is here to discuss her progress with her obesity treatment plan along with follow-up of her obesity related diagnoses. Lindsey Figueroa is on the Category 3 Plan and states she is following her eating plan approximately 80% of the time. Lindsey Figueroa states she is walking for 20-30 minutes 3 times per week.  Today's visit was #: 5 Starting weight: 374 lbs Starting date: 04/10/2022 Today's weight: 366 lbs Today's date: 06/15/2022 Total lbs lost to date: 8 Total lbs lost since last in-office visit: 5  Interim History: Lindsey Figueroa is doing well. She is using [employer] occasionally as a meal replacement. She reports having more energy and sleeping better. She notes decreased cravings on metformin. She denies side effects. Our bioimpedance scale shows a decrease in body fat and an increase in muscle mass.   Subjective:   1. Insulin resistance Lindsey Figueroa has insulin resistance with abnormal cravings. Her level is improving on metformin with no side effects.   2. Hypertension, essential Lindsey Figueroa's blood pressure has improved on amlodipine, but it is not at goal.   Assessment/Plan:   1. Insulin resistance Lindsey Figueroa will continue with her weight loss therapy, and we will refill metformin for 1 month.   - metFORMIN (GLUCOPHAGE) 500 MG tablet; Take 1 tablet (500 mg total) by mouth daily with breakfast.  Dispense: 30 tablet; Refill: 0  2. Hypertension, essential Lindsey Figueroa will continue with her weight loss therapy and CCB.  Recommend further medication intensification per primary care team.   3. Obesity, current BMI 57.4 Lindsey Figueroa is currently in the action stage of change. As such, her goal is to continue with weight loss efforts. She has agreed to the Category 3 Plan.   She will continue metformin for incretin effect and good response.   Exercise goals: As is, start strength training 2-3 times per week.   Behavioral modification strategies: increasing lean protein intake, increasing  water intake, no skipping meals, meal planning and cooking strategies, and holiday eating strategies .  Lindsey Figueroa has agreed to follow-up with our clinic in 2 weeks. She was informed of the importance of frequent follow-up visits to maximize her success with intensive lifestyle modifications for her multiple health conditions.   Objective:   Blood pressure 138/86, pulse 78, temperature 98 F (36.7 C), height 5\' 7"  (1.702 m), weight (!) 366 lb (166 kg), SpO2 97 %. Body mass index is 57.32 kg/m.  General: Cooperative, alert, well developed, in no acute distress. HEENT: Conjunctivae and lids unremarkable. Cardiovascular: Regular rhythm.  Lungs: Normal work of breathing. Neurologic: No focal deficits.   Lab Results  Component Value Date   CREATININE 0.75 04/10/2022   BUN 9 04/10/2022   NA 138 04/10/2022   K 4.2 04/10/2022   CL 102 04/10/2022   CO2 22 04/10/2022   Lab Results  Component Value Date   ALT 17 04/10/2022   AST 17 04/10/2022   ALKPHOS 82 04/10/2022   BILITOT 0.3 04/10/2022   Lab Results  Component Value Date   HGBA1C 6.1 (H) 04/10/2022   HGBA1C 6.2 02/17/2022   HGBA1C 6.0 07/24/2011   Lab Results  Component Value Date   INSULIN 9.9 04/10/2022   INSULIN 14.9 10/12/2020   Lab Results  Component Value Date   TSH 1.780 04/10/2022   Lab Results  Component Value Date   CHOL 157 04/10/2022   HDL 54 04/10/2022   LDLCALC 81 04/10/2022   TRIG 123 04/10/2022   CHOLHDL 2 02/17/2022   Lab Results  Component Value Date   VD25OH 6.7 (L) 04/10/2022   VD25OH <7.00 (L) 02/17/2022   Lab Results  Component Value Date   WBC 7.0 04/10/2022   HGB 12.1 04/10/2022   HCT 38.9 04/10/2022   MCV 77 (L) 04/10/2022   PLT 236.0 02/17/2022   No results found for: "IRON", "TIBC", "FERRITIN"  Attestation Statements:   Reviewed by clinician on day of visit: allergies, medications, problem list, medical history, surgical history, family history, social history, and previous  encounter notes.  Time spent on visit including pre-visit chart review and post-visit care and charting was 20 minutes.   Trude Mcburney, am acting as transcriptionist for Worthy Rancher, MD.  I have reviewed the above documentation for accuracy and completeness, and I agree with the above. -Worthy Rancher, MD

## 2022-07-07 ENCOUNTER — Telehealth: Payer: BLUE CROSS/BLUE SHIELD | Admitting: Physician Assistant

## 2022-07-07 DIAGNOSIS — J069 Acute upper respiratory infection, unspecified: Secondary | ICD-10-CM

## 2022-07-07 MED ORDER — FLUTICASONE PROPIONATE 50 MCG/ACT NA SUSP: 2.0000 | Freq: Every day | NASAL | 0 refills | Status: DC

## 2022-07-07 MED ORDER — BENZONATATE 100 MG PO CAPS: 100.0000 mg | ORAL_CAPSULE | Freq: Three times a day (TID) | ORAL | 0 refills | Status: DC | PRN

## 2022-07-07 NOTE — Progress Notes (Signed)
E-Visit for Upper Respiratory Infection   We are sorry you are not feeling well.  Here is how we plan to help!  Based on what you have shared with me, it looks like you may have a viral upper respiratory infection.  Upper respiratory infections are caused by a large number of viruses; however, rhinovirus is the most common cause.   Symptoms vary from person to person, with common symptoms including sore throat, cough, fatigue or lack of energy and feeling of general discomfort.  A low-grade fever of up to 100.4 may present, but is often uncommon.  Symptoms vary however, and are closely related to a person's age or underlying illnesses.  The most common symptoms associated with an upper respiratory infection are nasal discharge or congestion, cough, sneezing, headache and pressure in the ears and face.  These symptoms usually persist for about 3 to 10 days, but can last up to 2 weeks.  It is important to know that upper respiratory infections do not cause serious illness or complications in most cases.    Upper respiratory infections can be transmitted from person to person, with the most common method of transmission being a person's hands.  The virus is able to live on the skin and can infect other persons for up to 2 hours after direct contact.  Also, these can be transmitted when someone coughs or sneezes; thus, it is important to cover the mouth to reduce this risk.  To keep the spread of the illness at bay, good hand hygiene is very important.  This is an infection that is most likely caused by a virus. There are no specific treatments other than to help you with the symptoms until the infection runs its course.  We are sorry you are not feeling well.  Here is how we plan to help!   For nasal congestion, you may use an oral decongestants such as Mucinex D or if you have glaucoma or high blood pressure use plain Mucinex.  Saline nasal spray or nasal drops can help and can safely be used as often as  needed for congestion.  For your congestion, I have prescribed Fluticasone nasal spray one spray in each nostril twice a day  If you do not have a history of heart disease, hypertension, diabetes or thyroid disease, prostate/bladder issues or glaucoma, you may also use Sudafed to treat nasal congestion.  It is highly recommended that you consult with a pharmacist or your primary care physician to ensure this medication is safe for you to take.     If you have a cough, you may use cough suppressants such as Delsym and Robitussin.  If you have glaucoma or high blood pressure, you can also use Coricidin HBP.   For cough I have prescribed for you A prescription cough medication called Tessalon Perles 100 mg. You may take 1-2 capsules every 8 hours as needed for cough  If you have a sore or scratchy throat, use a saltwater gargle-  to  teaspoon of salt dissolved in a 4-ounce to 8-ounce glass of warm water.  Gargle the solution for approximately 15-30 seconds and then spit.  It is important not to swallow the solution.  You can also use throat lozenges/cough drops and Chloraseptic spray to help with throat pain or discomfort.  Warm or cold liquids can also be helpful in relieving throat pain.  For headache, pain or general discomfort, you can use Ibuprofen or Tylenol as directed.   Some authorities believe   that zinc sprays or the use of Echinacea may shorten the course of your symptoms.   HOME CARE Only take medications as instructed by your medical team. Be sure to drink plenty of fluids. Water is fine as well as fruit juices, sodas and electrolyte beverages. You may want to stay away from caffeine or alcohol. If you are nauseated, try taking small sips of liquids. How do you know if you are getting enough fluid? Your urine should be a pale yellow or almost colorless. Get rest. Taking a steamy shower or using a humidifier may help nasal congestion and ease sore throat pain. You can place a towel over  your head and breathe in the steam from hot water coming from a faucet. Using a saline nasal spray works much the same way. Cough drops, hard candies and sore throat lozenges may ease your cough. Avoid close contacts especially the very young and the elderly Cover your mouth if you cough or sneeze Always remember to wash your hands.   GET HELP RIGHT AWAY IF: You develop worsening fever. If your symptoms do not improve within 10 days You develop yellow or green discharge from your nose over 3 days. You have coughing fits You develop a severe head ache or visual changes. You develop shortness of breath, difficulty breathing or start having chest pain Your symptoms persist after you have completed your treatment plan  MAKE SURE YOU  Understand these instructions. Will watch your condition. Will get help right away if you are not doing well or get worse.  Thank you for choosing an e-visit.  Your e-visit answers were reviewed by a board certified advanced clinical practitioner to complete your personal care plan. Depending upon the condition, your plan could have included both over the counter or prescription medications.  Please review your pharmacy choice. Make sure the pharmacy is open so you can pick up prescription now. If there is a problem, you may contact your provider through MyChart messaging and have the prescription routed to another pharmacy.  Your safety is important to us. If you have drug allergies check your prescription carefully.   For the next 24 hours you can use MyChart to ask questions about today's visit, request a non-urgent call back, or ask for a work or school excuse. You will get an email in the next two days asking about your experience. I hope that your e-visit has been valuable and will speed your recovery.   I have spent 5 minutes in review of e-visit questionnaire, review and updating patient chart, medical decision making and response to patient.    Christyana Corwin M Leocadio Heal, PA-C  

## 2022-07-13 ENCOUNTER — Ambulatory Visit (INDEPENDENT_AMBULATORY_CARE_PROVIDER_SITE_OTHER): Payer: BLUE CROSS/BLUE SHIELD | Admitting: Internal Medicine

## 2022-07-13 ENCOUNTER — Encounter (INDEPENDENT_AMBULATORY_CARE_PROVIDER_SITE_OTHER): Payer: Self-pay | Admitting: Internal Medicine

## 2022-07-13 VITALS — BP 157/89 | HR 74 | Temp 98.2°F | Ht 67.0 in | Wt 361.8 lb

## 2022-07-13 DIAGNOSIS — G4733 Obstructive sleep apnea (adult) (pediatric): Secondary | ICD-10-CM | POA: Diagnosis not present

## 2022-07-13 DIAGNOSIS — E669 Obesity, unspecified: Secondary | ICD-10-CM

## 2022-07-13 DIAGNOSIS — E559 Vitamin D deficiency, unspecified: Secondary | ICD-10-CM

## 2022-07-13 DIAGNOSIS — I10 Essential (primary) hypertension: Secondary | ICD-10-CM | POA: Diagnosis not present

## 2022-07-13 DIAGNOSIS — Z6841 Body Mass Index (BMI) 40.0 and over, adult: Secondary | ICD-10-CM

## 2022-07-13 DIAGNOSIS — E88819 Insulin resistance, unspecified: Secondary | ICD-10-CM | POA: Diagnosis not present

## 2022-07-13 MED ORDER — METFORMIN HCL 500 MG PO TABS: 500.0000 mg | ORAL_TABLET | Freq: Every day | ORAL | 0 refills | Status: DC

## 2022-07-13 MED ORDER — TELMISARTAN 20 MG PO TABS: 20.0000 mg | ORAL_TABLET | Freq: Every day | ORAL | 0 refills | Status: DC

## 2022-07-13 NOTE — Progress Notes (Signed)
Chief Complaint:   OBESITY Lindsey Figueroa is here to discuss her progress with her obesity treatment plan along with follow-up of her obesity related diagnoses. Lindsey Figueroa is on the Category 3 Plan and states she is following her eating plan approximately 65% of the time. Lindsey Figueroa states she is walking/strength for 15-20 minutes 2 times per week.  Today's visit was #: 6 Starting weight: 374 lbs Starting date: 04/10/2022 Today's weight: 361 lbs Today's date: 07/13/2022 Total lbs lost to date: 13 Total lbs lost since last in-office visit: 5  Interim History: Lindsey Figueroa presents today for short interval follow-up.  Since last office visit she has lost 5 pounds has good adherence to prescribed nutritional plan.  She reports adequate satiety and denies any abnormal cravings or eating patterns.  She is no longer skipping meals and has started to do strengthening exercises via [employer].  She is currently on metformin and finds medication has helped with cravings and with sense of fullness.  She denies any adverse effects.  Her bioimpedance measures shows a reduction in body fat percentage from 58% to 55.  There is an increase in muscle mass and visceral fat rating is down from 24-21.  She feels better has more energy.  She is also been using meal replacements on occasions these include [employer].  Patient will also be traveling and is asking for advice regarding strategies to stay on course.  Subjective:   1. OSA (obstructive sleep apnea)-suspected Symptoms of OSA and high risk phenotype.   2. Hypertension, essential Blood pressure is uncontrolled, (worsening). Counseled on risks and goals of care.   3. Insulin resistance Goal is HgbA1c < 5.7, fasting insulin closer to 5.    Lab Results  Component Value Date   HGBA1C 6.1 (H) 04/10/2022   Lab Results  Component Value Date   INSULIN 9.9 04/10/2022   INSULIN 14.9 10/12/2020   Assessment/Plan:   1. OSA (obstructive sleep apnea)-suspected She has  not scheduled a sleep study yet. She was advised to do so.   2. Hypertension, essential Start telmisartan 20 mg once daily. Declined HCTZ due to polyuria in the past. Continue amlodipine at current dose. Monitor blood pressure at home and follow-up with PCP for medication adjustment.    - telmisartan (MICARDIS) 20 MG tablet; Take 1 tablet (20 mg total) by mouth daily.  Dispense: 30 tablet; Refill: 0  3. Insulin resistance Continue metformin, and refill for 1 month.   - metFORMIN (GLUCOPHAGE) 500 MG tablet; Take 1 tablet (500 mg total) by mouth daily with breakfast.  Dispense: 30 tablet; Refill: 0  4. Obesity, current BMI 56.7 Plan: Continue metformin at current dose for weight management and incretin effect.  Lindsey Figueroa is currently in the action stage of change. As such, her goal is to continue with weight loss efforts. She has agreed to the Category 3 Plan.   Exercise goals: Increase FIT.   Behavioral modification strategies: increasing lean protein intake, increasing water intake, travel eating strategies, avoiding temptations, and planning for success.  Lindsey Figueroa has agreed to follow-up with our clinic in 3 to 4 weeks. She was informed of the importance of frequent follow-up visits to maximize her success with intensive lifestyle modifications for her multiple health conditions.   Objective:   Blood pressure (!) 157/89, pulse 74, temperature 98.2 F (36.8 C), height 5\' 7"  (1.702 m), weight (!) 361 lb 12.8 oz (164.1 kg), SpO2 100 %. Body mass index is 56.67 kg/m.  General: Cooperative, alert, well developed,  in no acute distress. HEENT: Conjunctivae and lids unremarkable. Cardiovascular: Regular rhythm.  Lungs: Normal work of breathing. Neurologic: No focal deficits.   Lab Results  Component Value Date   CREATININE 0.75 04/10/2022   BUN 9 04/10/2022   NA 138 04/10/2022   K 4.2 04/10/2022   CL 102 04/10/2022   CO2 22 04/10/2022   Lab Results  Component Value Date   ALT 17  04/10/2022   AST 17 04/10/2022   ALKPHOS 82 04/10/2022   BILITOT 0.3 04/10/2022   Lab Results  Component Value Date   HGBA1C 6.1 (H) 04/10/2022   HGBA1C 6.2 02/17/2022   HGBA1C 6.0 07/24/2011   Lab Results  Component Value Date   INSULIN 9.9 04/10/2022   INSULIN 14.9 10/12/2020   Lab Results  Component Value Date   TSH 1.780 04/10/2022   Lab Results  Component Value Date   CHOL 157 04/10/2022   HDL 54 04/10/2022   LDLCALC 81 04/10/2022   TRIG 123 04/10/2022   CHOLHDL 2 02/17/2022   Lab Results  Component Value Date   VD25OH 6.7 (L) 04/10/2022   VD25OH <7.00 (L) 02/17/2022   Lab Results  Component Value Date   WBC 7.0 04/10/2022   HGB 12.1 04/10/2022   HCT 38.9 04/10/2022   MCV 77 (L) 04/10/2022   PLT 236.0 02/17/2022   No results found for: "IRON", "TIBC", "FERRITIN"  Attestation Statements:   Reviewed by clinician on day of visit: allergies, medications, problem list, medical history, surgical history, family history, social history, and previous encounter notes.   Wilhemena Durie, am acting as transcriptionist for Thomes Dinning, MD.  I have reviewed the above documentation for accuracy and completeness, and I agree with the above. -Thomes Dinning, MD

## 2022-07-18 DIAGNOSIS — G4733 Obstructive sleep apnea (adult) (pediatric): Secondary | ICD-10-CM | POA: Insufficient documentation

## 2022-07-31 ENCOUNTER — Ambulatory Visit (INDEPENDENT_AMBULATORY_CARE_PROVIDER_SITE_OTHER): Payer: BLUE CROSS/BLUE SHIELD | Admitting: Internal Medicine

## 2022-08-07 ENCOUNTER — Encounter (INDEPENDENT_AMBULATORY_CARE_PROVIDER_SITE_OTHER): Payer: Self-pay | Admitting: Internal Medicine

## 2022-08-07 ENCOUNTER — Ambulatory Visit (INDEPENDENT_AMBULATORY_CARE_PROVIDER_SITE_OTHER): Payer: BLUE CROSS/BLUE SHIELD | Admitting: Internal Medicine

## 2022-08-07 VITALS — BP 138/84 | HR 81 | Temp 98.1°F | Ht 67.0 in | Wt 359.0 lb

## 2022-08-07 DIAGNOSIS — Z6841 Body Mass Index (BMI) 40.0 and over, adult: Secondary | ICD-10-CM

## 2022-08-07 DIAGNOSIS — E669 Obesity, unspecified: Secondary | ICD-10-CM

## 2022-08-07 DIAGNOSIS — E88819 Insulin resistance, unspecified: Secondary | ICD-10-CM

## 2022-08-07 DIAGNOSIS — R29818 Other symptoms and signs involving the nervous system: Secondary | ICD-10-CM

## 2022-08-07 DIAGNOSIS — E559 Vitamin D deficiency, unspecified: Secondary | ICD-10-CM

## 2022-08-07 DIAGNOSIS — I10 Essential (primary) hypertension: Secondary | ICD-10-CM

## 2022-08-07 MED ORDER — METFORMIN HCL 500 MG PO TABS: 500.0000 mg | ORAL_TABLET | Freq: Every day | ORAL | 0 refills | Status: DC

## 2022-08-07 MED ORDER — VITAMIN D (ERGOCALCIFEROL) 1.25 MG (50000 UNIT) PO CAPS: 50000.0000 [IU] | ORAL_CAPSULE | ORAL | 0 refills | Status: DC

## 2022-08-07 NOTE — Progress Notes (Unsigned)
Chief Complaint:   OBESITY Lindsey Figueroa is here to discuss her progress with her obesity treatment plan along with follow-up of her obesity related diagnoses. Lindsey Figueroa is on the Category 3 Plan and states she is following her eating plan approximately 60% of the time. Lindsey Figueroa states she is doing strength training for 15 minutes 2-3 times per week.  Today's visit was #: 7 Starting weight: 374 lbs Starting date: 04/10/2022 Today's weight: 359 lbs Today's date: 08/07/2022 Total lbs lost to date: 15 Total lbs lost since last in-office visit: 2  Interim History: Lindsey Figueroa presents today for follow-up.  Since last office visit she has lost 2 pounds.  Her adherence to a meal plan has gone down.  She lost her job recently and is now spending a lot of time at home.  She notes boredom and increased frequency of snacking.  She reports her snacking choices are healthy but sometimes she is not eating 3 meals a day.  She has been journaling at times using my fitness pal but has been incorporated calories of physical activity into a question.  She finds metformin helpful in regards to cravings and sense of satiety.  Subjective:   1. Hypertension, essential I reviewed home blood pressure monitoring on the days she takes her blood pressure medications is at goal.  She never filled telmisartan.    2. Vitamin D deficiency Associated with excess adiposity and may result in leptin resistance and adipogenesis.    3. Insulin resistance Which may contribute to cravings for carbs.  On metformin.    4. Suspected sleep apnea She has symptoms of OSA and a high risk phenotype.  She has not scheduled sleep study and now will be losing her insurance.    Assessment/Plan:   1. Hypertension, essential We will hold off starting medication.  She will continue on amlodipine 5 mg once a day and will work on taking medication daily.  She will continue to monitor her blood pressure at home.  Weight loss therapy should help.  2.  Vitamin D deficiency She is on high-dose vitamin D supplementation without any side effects medication was refilled for 3 months we will check levels at that time for goal levels between 50 and 60.  - Vitamin D, Ergocalciferol, (DRISDOL) 1.25 MG (50000 UNIT) CAPS capsule; Take 1 capsule (50,000 Units total) by mouth every 7 (seven) days.  Dispense: 13 capsule; Refill: 0  3. Insulin resistance Continue medication and weight loss therapy. Refill metformin for 30 days.   - metFORMIN (GLUCOPHAGE) 500 MG tablet; Take 1 tablet (500 mg total) by mouth daily with breakfast.  Dispense: 30 tablet; Refill: 0  4. Suspected sleep apnea She would like to hold off until she has new insurance.  5. Obesity, current BMI 56.4 She will work on improving adherence to meal plan.  We counseled on the 4 pillars of weight management.  She will work all also increasing physical activity for goal of 150 minutes a week of light aerobic activity.  She will increase strength training to 2-3 times a week doing YouTube videos and light weights.  She will continue on metformin for incretin effect this medication has been helpful without any side effects.  Medication refilled for 30 days.  Lindsey Figueroa is currently in the action stage of change. As such, her goal is to continue with weight loss efforts. She has agreed to the Category 3 Plan.   Exercise goals: For substantial health benefits, adults should do at least 150  minutes (2 hours and 30 minutes) a week of moderate-intensity, or 75 minutes (1 hour and 15 minutes) a week of vigorous-intensity aerobic physical activity, or an equivalent combination of moderate- and vigorous-intensity aerobic activity. Aerobic activity should be performed in episodes of at least 10 minutes, and preferably, it should be spread throughout the week.  Behavioral modification strategies: increasing lean protein intake, no skipping meals, emotional eating strategies, avoiding temptations, and planning  for success.  Lindsey Figueroa has agreed to follow-up with our clinic in 3 weeks. She was informed of the importance of frequent follow-up visits to maximize her success with intensive lifestyle modifications for her multiple health conditions.   Objective:   Blood pressure 138/84, pulse 81, temperature 98.1 F (36.7 C), height 5\' 7"  (1.702 m), weight (!) 359 lb (162.8 kg), SpO2 98 %. Body mass index is 56.23 kg/m.  General: Cooperative, alert, well developed, in no acute distress. HEENT: Conjunctivae and lids unremarkable. Cardiovascular: Regular rhythm.  Lungs: Normal work of breathing. Neurologic: No focal deficits.   Lab Results  Component Value Date   CREATININE 0.75 04/10/2022   BUN 9 04/10/2022   NA 138 04/10/2022   K 4.2 04/10/2022   CL 102 04/10/2022   CO2 22 04/10/2022   Lab Results  Component Value Date   ALT 17 04/10/2022   AST 17 04/10/2022   ALKPHOS 82 04/10/2022   BILITOT 0.3 04/10/2022   Lab Results  Component Value Date   HGBA1C 6.1 (H) 04/10/2022   HGBA1C 6.2 02/17/2022   HGBA1C 6.0 07/24/2011   Lab Results  Component Value Date   INSULIN 9.9 04/10/2022   INSULIN 14.9 10/12/2020   Lab Results  Component Value Date   TSH 1.780 04/10/2022   Lab Results  Component Value Date   CHOL 157 04/10/2022   HDL 54 04/10/2022   LDLCALC 81 04/10/2022   TRIG 123 04/10/2022   CHOLHDL 2 02/17/2022   Lab Results  Component Value Date   VD25OH 6.7 (L) 04/10/2022   VD25OH <7.00 (L) 02/17/2022   Lab Results  Component Value Date   WBC 7.0 04/10/2022   HGB 12.1 04/10/2022   HCT 38.9 04/10/2022   MCV 77 (L) 04/10/2022   PLT 236.0 02/17/2022   No results found for: "IRON", "TIBC", "FERRITIN"  Attestation Statements:   Reviewed by clinician on day of visit: allergies, medications, problem list, medical history, surgical history, family history, social history, and previous encounter notes.   Wilhemena Durie, am acting as transcriptionist for Thomes Dinning, MD.  I have reviewed the above documentation for accuracy and completeness, and I agree with the above. -Thomes Dinning, MD

## 2022-08-28 ENCOUNTER — Ambulatory Visit (INDEPENDENT_AMBULATORY_CARE_PROVIDER_SITE_OTHER): Payer: BLUE CROSS/BLUE SHIELD | Admitting: Internal Medicine

## 2022-08-28 ENCOUNTER — Encounter (INDEPENDENT_AMBULATORY_CARE_PROVIDER_SITE_OTHER): Payer: Self-pay | Admitting: Internal Medicine

## 2022-08-28 VITALS — BP 128/84 | Temp 97.5°F | Ht 67.0 in | Wt 358.0 lb

## 2022-08-28 DIAGNOSIS — R29818 Other symptoms and signs involving the nervous system: Secondary | ICD-10-CM

## 2022-08-28 DIAGNOSIS — I10 Essential (primary) hypertension: Secondary | ICD-10-CM | POA: Diagnosis not present

## 2022-08-28 DIAGNOSIS — Z6841 Body Mass Index (BMI) 40.0 and over, adult: Secondary | ICD-10-CM

## 2022-08-28 DIAGNOSIS — E88819 Insulin resistance, unspecified: Secondary | ICD-10-CM | POA: Diagnosis not present

## 2022-08-28 DIAGNOSIS — E559 Vitamin D deficiency, unspecified: Secondary | ICD-10-CM | POA: Diagnosis not present

## 2022-08-28 MED ORDER — METFORMIN HCL 500 MG PO TABS: 500.0000 mg | ORAL_TABLET | Freq: Two times a day (BID) | ORAL | 0 refills | Status: DC

## 2022-08-28 NOTE — Assessment & Plan Note (Signed)
Blood pressure at goal for age and risk category.  On amlodipine 5 mg once a day without adverse effects.  Most recent renal parameters reviewed which showed normal electrolytes and kidney function.  Continue with weight loss therapy.  Monitor for symptoms of orthostasis while losing weight. Continue current regimen and home monitoring for a goal blood pressure of 120/80.

## 2022-08-28 NOTE — Assessment & Plan Note (Signed)
Patient unable to schedule sleep study due to lack of insurance coverage.

## 2022-08-28 NOTE — Assessment & Plan Note (Signed)
Her HOMA-IR is 1.98. Optimal level < 1.9. This is complex condition associated with genetics, ectopic fat and lifestyle factors. Insulin resistance may result in weight gain, abnormal cravings (particularly for carbs) and fatigue. This may result in additional weight gain and lead to pre-diabetes and diabetes if untreated.   Lab Results  Component Value Date   HGBA1C 6.1 (H) 04/10/2022   Lab Results  Component Value Date   INSULIN 9.9 04/10/2022   INSULIN 14.9 10/12/2020   Lab Results  Component Value Date   GLUCOSE 81 04/10/2022   GLUCOSE 106 (H) 07/13/2009    I recommend ongoing weight loss therapy (10% of TBW), increasing physical activity, reducing processed, simple sugars and saturated fats in diet.  We will increase metformin 500 mg to twice a day.

## 2022-08-28 NOTE — Assessment & Plan Note (Signed)
Most recent vitamin D levels  Lab Results  Component Value Date   VD25OH 6.7 (L) 04/10/2022   VD25OH <7.00 (L) 02/17/2022     Deficiency state associated with adiposity and may result in leptin resistance, weight gain and fatigue. Currently on vitamin D supplementation without any adverse effects.  Plan: Continue vitamin D supplementation.  Check vitamin D levels today for goal of 50-60.

## 2022-08-28 NOTE — Progress Notes (Signed)
Office: [phone]  /  Fax: [phone]  WEIGHT SUMMARY AND BIOMETRICS  Medical Weight Loss Height: 5' 7"$  (1.702 m) Weight: 358 lb (162.4 kg) Temp: (!) 97.5 F (36.4 C) BP: 128/84 SpO2: 98 % Fasting: y Labs: n Today's Visit #: 8 Weight at Last VIsit: 359 lb Weight Lost Since Last Visit: 1 lb  Body Fat %: 55 % Fat Mass (lbs): 197 lbs Muscle Mass (lbs): 153 lbs Total Body Water (lbs): 114 lbs Visceral Fat Rating : 21 Peak Weight: 385 ln Starting Date: 04/10/22 Starting Weight: 374 lb Total Weight Loss (lbs): 16 lb (7.258 kg)    HPI  Chief Complaint: OBESITY  Lindsey Figueroa is here to discuss her progress with her obesity treatment plan. She is on the the Category 3 Plan and states she is following her eating plan approximately 70 % of the time. She states she is exercising 20 minutes 4 times per week.   Interval History:  Since last office visit she has lost 1 lb and increased physical activity.  She is somewhat disappointed about her weight loss and perceived efforts.  Her peak weight was 385 in August of last year she is down to 358 for a total of 27 pounds. Adherence to prescribed reduced calorie healthy diet is good.  She feels she is getting prescribed protein.  She also finds metformin is helpful Denies problems with appetite and hunger signals.  Denies problems with satiety and satiation.  Denies problems with eating patterns and portion control.  Barriers identified other:unemployment .    Pharmacotherapy: metformin incretin effect and insulin resistance  PHYSICAL EXAM:  Blood pressure 128/84, temperature (!) 97.5 F (36.4 C), height 5' 7"$  (1.702 m), weight (!) 358 lb (162.4 kg), SpO2 98 %. Body mass index is 56.07 kg/m.  General: She is overweight, cooperative, alert, well developed, and in no acute distress. PSYCH: Has normal mood, affect and thought process.   HEENT: EOMI, sclerae are anicteric. Lungs: Normal breathing effort, no conversational  dyspnea. Extremities: No edema.  Neurologic: No gross sensory or motor deficits. No tremors or fasciculations noted.    DIAGNOSTIC DATA REVIEWED:  BMET    Component Value Date/Time   NA 138 04/10/2022 1047   K 4.2 04/10/2022 1047   CL 102 04/10/2022 1047   CO2 22 04/10/2022 1047   GLUCOSE 81 04/10/2022 1047   GLUCOSE 103 (H) 02/17/2022 1004   BUN 9 04/10/2022 1047   CREATININE 0.75 04/10/2022 1047   CREATININE 0.82 12/21/2017 1509   CALCIUM 9.0 04/10/2022 1047   GFRNONAA >60 03/17/2020 1424   GFRAA >60 03/17/2020 1424   Lab Results  Component Value Date   HGBA1C 6.1 (H) 04/10/2022   HGBA1C 6.0 07/24/2011   Lab Results  Component Value Date   INSULIN 9.9 04/10/2022   INSULIN 14.9 10/12/2020   Lab Results  Component Value Date   TSH 1.780 04/10/2022   CBC    Component Value Date/Time   WBC 7.0 04/10/2022 1047   WBC 8.2 02/17/2022 1004   RBC 5.08 04/10/2022 1047   RBC 5.13 (H) 02/17/2022 1004   HGB 12.1 04/10/2022 1047   HCT 38.9 04/10/2022 1047   PLT 236.0 02/17/2022 1004   MCV 77 (L) 04/10/2022 1047   MCH 23.8 (L) 04/10/2022 1047   MCH 23.8 (L) 03/17/2020 1424   MCHC 31.1 (L) 04/10/2022 1047   MCHC 32.4 02/17/2022 1004   RDW 16.0 (H) 04/10/2022 1047   Iron Studies No results found for: "IRON", "TIBC", "FERRITIN", "  IRONPCTSAT" Lipid Panel     Component Value Date/Time   CHOL 157 04/10/2022 1047   TRIG 123 04/10/2022 1047   HDL 54 04/10/2022 1047   CHOLHDL 2 02/17/2022 1004   VLDL 10.4 02/17/2022 1004   LDLCALC 81 04/10/2022 1047   LDLCALC 61 12/21/2017 1509   Hepatic Function Panel     Component Value Date/Time   PROT 6.7 04/10/2022 1047   ALBUMIN 3.8 (L) 04/10/2022 1047   AST 17 04/10/2022 1047   ALT 17 04/10/2022 1047   ALKPHOS 82 04/10/2022 1047   BILITOT 0.3 04/10/2022 1047      Component Value Date/Time   TSH 1.780 04/10/2022 1047   Nutritional Lab Results  Component Value Date   VD25OH 6.7 (L) 04/10/2022   VD25OH <7.00 (L)  02/17/2022     ASSESSMENT AND PLAN  TREATMENT PLAN FOR OBESITY:  Recommended Dietary Goals  Lindsey Figueroa is currently in the action stage of change. As such, her goal is to continue weight management plan. She has agreed to the Category 2 Plan.  Behavioral Intervention  We discussed the following Behavioral Modification Strategies today: increasing lean protein intake, increasing vegetables, increase water intake, work on meal planning and easy cooking plans, reading food labels and making healthy choices when eating convenient foods, and work on tracking and journaling calories using tracking App.  Additional resources provided today: NA  Recommended Physical Activity Goals  [name] has been advised to work up to 150 minutes of moderate intensity aerobic activity a week and strengthening exercises 2-3 times per week for cardiovascular health, weight loss maintenance and preservation of muscle mass.   She has agreed to increase physical activity in their day and reduce sedentary time (increase NEAT).  and continue physical activity as is.    Pharmacotherapy We discussed various medication options to help [facility] with her weight loss efforts and we both agreed to increase metformin to 500 mg twice a day.  Patient aware medication is being used off label for incretin effect.  Her insurance does not cover GLP-1 drugs.  We also discussed about the possibility of use of Qsymia as the next step, to allow her to further reduce her calories.  ASSOCIATED CONDITIONS ADDRESSED TODAY  Hypertension, essential Assessment & Plan: Blood pressure at goal for age and risk category.  On amlodipine 5 mg once a day without adverse effects.  Most recent renal parameters reviewed which showed normal electrolytes and kidney function.  Continue with weight loss therapy.  Monitor for symptoms of orthostasis while losing weight. Continue current regimen and home monitoring for a goal blood pressure of  120/80.    Insulin resistance Assessment & Plan: Her HOMA-IR is 1.98. Optimal level < 1.9. This is complex condition associated with genetics, ectopic fat and lifestyle factors. Insulin resistance may result in weight gain, abnormal cravings (particularly for carbs) and fatigue. This may result in additional weight gain and lead to pre-diabetes and diabetes if untreated.   Lab Results  Component Value Date   HGBA1C 6.1 (H) 04/10/2022   Lab Results  Component Value Date   INSULIN 9.9 04/10/2022   INSULIN 14.9 10/12/2020   Lab Results  Component Value Date   GLUCOSE 81 04/10/2022   GLUCOSE 106 (H) 07/13/2009    I recommend ongoing weight loss therapy (10% of TBW), increasing physical activity, reducing processed, simple sugars and saturated fats in diet.  We will increase metformin 500 mg to twice a day.   Orders: -  metFORMIN HCl; Take 1 tablet (500 mg total) by mouth 2 (two) times daily with a meal.  Dispense: 60 tablet; Refill: 0  Suspected sleep apnea Assessment & Plan: Patient unable to schedule sleep study due to lack of insurance coverage.   Class 3 severe obesity without serious comorbidity with body mass index (BMI) of 50.0 to 59.9 in adult, unspecified obesity type (Lindsey Figueroa)  Vitamin D deficiency Assessment & Plan: Most recent vitamin D levels  Lab Results  Component Value Date   VD25OH 6.7 (L) 04/10/2022   VD25OH <7.00 (L) 02/17/2022     Deficiency state associated with adiposity and may result in leptin resistance, weight gain and fatigue. Currently on vitamin D supplementation without any adverse effects.  Plan: Continue vitamin D supplementation.  Check vitamin D levels today for goal of 50-60.  Orders: -     VITAMIN D 25 Hydroxy (Vit-D Deficiency, Fractures)      Return in about 2 weeks (around 09/11/2022) for For Weight Mangement with Dr. Gerarda Fraction.Marland Kitchen She was informed of the importance of frequent follow up visits to maximize her success with  intensive lifestyle modifications for her multiple health conditions.   ATTESTASTION STATEMENTS:  Reviewed by clinician on day of visit: allergies, medications, problem list, medical history, surgical history, family history, social history, and previous encounter notes.   Time spent on visit including pre-visit chart review and post-visit care and charting was 30 minutes.    Thomes Dinning, MD

## 2022-08-29 LAB — VITAMIN D 25 HYDROXY (VIT D DEFICIENCY, FRACTURES): Vit D, 25-Hydroxy: 23.8 ng/mL — ABNORMAL LOW (ref 30.0–100.0)

## 2022-09-01 ENCOUNTER — Ambulatory Visit: Payer: BLUE CROSS/BLUE SHIELD | Admitting: Family Medicine

## 2022-09-11 ENCOUNTER — Encounter (INDEPENDENT_AMBULATORY_CARE_PROVIDER_SITE_OTHER): Payer: Self-pay | Admitting: Internal Medicine

## 2022-09-11 ENCOUNTER — Ambulatory Visit (INDEPENDENT_AMBULATORY_CARE_PROVIDER_SITE_OTHER): Payer: BLUE CROSS/BLUE SHIELD | Admitting: Internal Medicine

## 2022-09-11 VITALS — BP 130/80 | HR 68 | Temp 98.2°F | Ht 67.0 in | Wt 357.0 lb

## 2022-09-11 DIAGNOSIS — Z6841 Body Mass Index (BMI) 40.0 and over, adult: Secondary | ICD-10-CM

## 2022-09-11 DIAGNOSIS — R7303 Prediabetes: Secondary | ICD-10-CM | POA: Diagnosis not present

## 2022-09-11 DIAGNOSIS — E669 Obesity, unspecified: Secondary | ICD-10-CM | POA: Diagnosis not present

## 2022-09-11 DIAGNOSIS — I10 Essential (primary) hypertension: Secondary | ICD-10-CM | POA: Diagnosis not present

## 2022-09-11 MED ORDER — TOPIRAMATE 25 MG PO TABS: 25.0000 mg | ORAL_TABLET | Freq: Every day | ORAL | 0 refills | Status: DC

## 2022-09-11 NOTE — Assessment & Plan Note (Signed)
Most recent A1c is  Lab Results  Component Value Date   HGBA1C 6.1 (H) 04/10/2022  . Patient informed of disease state and risk of progression. This may contribute to abnormal cravings, fatigue and diabetes complications without having diabetes.   She will continue with weight loss therapy for goal of 10% of body weight.  She will continue metformin twice a day for pharmacoprophylaxis.  We are adding topiramate which may also improve glycemic control in addition to support weight loss efforts.

## 2022-09-11 NOTE — Progress Notes (Signed)
Office: [phone]  /  Fax: [phone]  WEIGHT SUMMARY AND BIOMETRICS  Vitals Temp: 98.2 F (36.8 C) BP: 130/80 Pulse Rate: 68 SpO2: 100 %   Anthropometric Measurements Height: '5\' 7"'$  (1.702 m) Weight: (!) 357 lb (161.9 kg) BMI (Calculated): 55.9 Weight at Last Visit: 358 lb Weight Lost Since Last Visit: 2 lb Starting Weight: 374 lb Total Weight Loss (lbs): 17 lb (7.711 kg) Peak Weight: 385 lb   Body Composition  Body Fat %: 55.9 % Fat Mass (lbs): 199.8 lbs Muscle Mass (lbs): 149.8 lbs Total Body Water (lbs): 116.4 lbs Visceral Fat Rating : 21   Other Clinical Data Fasting: No Labs: No Today's Visit #: 9 Starting Date: 04/10/22    HPI  Chief Complaint: OBESITY  Lindsey Figueroa is here to discuss her progress with her obesity treatment plan. She is on the the Category 2 Plan and states she is following her eating plan approximately 80 % of the time. She states she is exercising 30-60 minutes 2-4 times per week.  Interval History:  Since last office visit she has 2 pounds.  She has lost a total of 28 pounds since July.  She is somewhat disappointed by the rate of weight loss considering the amount of effort perceived.  She no longer has health insurance. She reports good. to prescribed reduced calorie nutrition plan. Has been working on tracking calories and has consistently been under or at 1200 cal.  She is also getting protein with each of her meals. Denies problems with appetite and hunger signals.  Denies problems with satiety and satiation.  Denies problems with eating patterns and portion control.   Barriers identified other: Unemployment limited medication coverage .   Pharmacotherapy for weight loss: She is currently taking Metformin (off label use for incretin effect and / or insulin resistance and / or diabetes prevention) .   Weight promoting medications identified: None.  ASSESSMENT AND PLAN  TREATMENT PLAN FOR OBESITY:  Recommended Dietary  Goals  [name] is currently in the action stage of change. As such, her goal is to continue weight management plan. She has agreed to the Category 2 Plan.  Behavioral Intervention  We discussed the following Behavioral Modification Strategies today: increasing lean protein intake, increasing vegetables, increasing water intake, work on meal planning and easy cooking plans, and work on tracking and journaling calories using tracking App.  Additional resources provided today: NA  Recommended Physical Activity Goals  [name] has been advised to work up to 150 minutes of moderate intensity aerobic activity a week and strengthening exercises 2-3 times per week for cardiovascular health, weight loss maintenance and preservation of muscle mass.   She has agreed to : Continue as is  Pharmacotherapy We discussed various medication options to help [facility] with her weight loss efforts and we both agreed to in addition to metformin we will add topiramate 25 mg 1 tablet daily.  Qsymia is cost prohibitive.  Patient made aware of off label use of topiramate.  We also discussed the benefits and side effects of medication.  ASSOCIATED CONDITIONS ADDRESSED TODAY  Hypertension, essential Assessment & Plan: Blood pressure at goal for age and risk category.  On amlodipine 5 mg a day without adverse effects.  Most recent renal parameters reviewed which showed normal electrolytes and kidney function.  Continue with weight loss therapy.  Monitor for symptoms of orthostasis while losing weight. Continue current regimen and home monitoring for a goal blood pressure of 120/80.    Obesity, current BMI  55.9  Prediabetes Assessment & Plan: Most recent A1c is  Lab Results  Component Value Date   HGBA1C 6.1 (H) 04/10/2022  . Patient informed of disease state and risk of progression. This may contribute to abnormal cravings, fatigue and diabetes complications without having diabetes.   She will continue with  weight loss therapy for goal of 10% of body weight.  She will continue metformin twice a day for pharmacoprophylaxis.  We are adding topiramate which may also improve glycemic control in addition to support weight loss efforts.    Other orders -     Topiramate; Take 1 tablet (25 mg total) by mouth daily.  Dispense: 30 tablet; Refill: 0     PHYSICAL EXAM:  Blood pressure 130/80, pulse 68, temperature 98.2 F (36.8 C), height '5\' 7"'$  (1.702 m), weight (!) 357 lb (161.9 kg), SpO2 100 %. Body mass index is 55.91 kg/m.  General: She is overweight, cooperative, alert, well developed, and in no acute distress. PSYCH: Has normal mood, affect and thought process.   HEENT: EOMI, sclerae are anicteric. Lungs: Normal breathing effort, no conversational dyspnea. Extremities: No edema.  Neurologic: No gross sensory or motor deficits. No tremors or fasciculations noted.    DIAGNOSTIC DATA REVIEWED:  BMET    Component Value Date/Time   NA 138 04/10/2022 1047   K 4.2 04/10/2022 1047   CL 102 04/10/2022 1047   CO2 22 04/10/2022 1047   GLUCOSE 81 04/10/2022 1047   GLUCOSE 103 (H) 02/17/2022 1004   BUN 9 04/10/2022 1047   CREATININE 0.75 04/10/2022 1047   CREATININE 0.82 12/21/2017 1509   CALCIUM 9.0 04/10/2022 1047   GFRNONAA >60 03/17/2020 1424   GFRAA >60 03/17/2020 1424   Lab Results  Component Value Date   HGBA1C 6.1 (H) 04/10/2022   HGBA1C 6.0 07/24/2011   Lab Results  Component Value Date   INSULIN 9.9 04/10/2022   INSULIN 14.9 10/12/2020   Lab Results  Component Value Date   TSH 1.780 04/10/2022   CBC    Component Value Date/Time   WBC 7.0 04/10/2022 1047   WBC 8.2 02/17/2022 1004   RBC 5.08 04/10/2022 1047   RBC 5.13 (H) 02/17/2022 1004   HGB 12.1 04/10/2022 1047   HCT 38.9 04/10/2022 1047   PLT 236.0 02/17/2022 1004   MCV 77 (L) 04/10/2022 1047   MCH 23.8 (L) 04/10/2022 1047   MCH 23.8 (L) 03/17/2020 1424   MCHC 31.1 (L) 04/10/2022 1047   MCHC 32.4 02/17/2022  1004   RDW 16.0 (H) 04/10/2022 1047   Iron Studies No results found for: "IRON", "TIBC", "FERRITIN", "IRONPCTSAT" Lipid Panel     Component Value Date/Time   CHOL 157 04/10/2022 1047   TRIG 123 04/10/2022 1047   HDL 54 04/10/2022 1047   CHOLHDL 2 02/17/2022 1004   VLDL 10.4 02/17/2022 1004   LDLCALC 81 04/10/2022 1047   LDLCALC 61 12/21/2017 1509   Hepatic Function Panel     Component Value Date/Time   PROT 6.7 04/10/2022 1047   ALBUMIN 3.8 (L) 04/10/2022 1047   AST 17 04/10/2022 1047   ALT 17 04/10/2022 1047   ALKPHOS 82 04/10/2022 1047   BILITOT 0.3 04/10/2022 1047      Component Value Date/Time   TSH 1.780 04/10/2022 1047   Nutritional Lab Results  Component Value Date   VD25OH 23.8 (L) 08/28/2022   VD25OH 6.7 (L) 04/10/2022   VD25OH <7.00 (L) 02/17/2022     Return in about 3 years (  around 09/11/2025) for For Weight Mangement with Dr. Gerarda Fraction.Marland Kitchen She was informed of the importance of frequent follow up visits to maximize her success with intensive lifestyle modifications for her multiple health conditions.   ATTESTASTION STATEMENTS:  Reviewed by clinician on day of visit: allergies, medications, problem list, medical history, surgical history, family history, social history, and previous encounter notes.    Thomes Dinning, MD

## 2022-09-11 NOTE — Assessment & Plan Note (Signed)
Blood pressure at goal for age and risk category.  On amlodipine 5 mg a day without adverse effects.  Most recent renal parameters reviewed which showed normal electrolytes and kidney function.  Continue with weight loss therapy.  Monitor for symptoms of orthostasis while losing weight. Continue current regimen and home monitoring for a goal blood pressure of 120/80.

## 2022-09-11 NOTE — Assessment & Plan Note (Signed)
Her HOMA-IR is 1.98. Optimal level < 1.9. This is complex condition associated with genetics, ectopic fat and lifestyle factors. Insulin resistance may result in weight gain, abnormal cravings (particularly for carbs) and fatigue. This may result in additional weight gain and lead to pre-diabetes and diabetes if untreated.   Lab Results  Component Value Date   HGBA1C 6.1 (H) 04/10/2022   Lab Results  Component Value Date   INSULIN 9.9 04/10/2022   INSULIN 14.9 10/12/2020   Lab Results  Component Value Date   GLUCOSE 81 04/10/2022   GLUCOSE 106 (H) 07/13/2009    I recommend ongoing weight loss therapy (10% of TBW), increasing physical activity, reducing processed, simple sugars and saturated fats in diet.

## 2022-10-02 ENCOUNTER — Encounter (INDEPENDENT_AMBULATORY_CARE_PROVIDER_SITE_OTHER): Payer: Self-pay | Admitting: Family Medicine

## 2022-10-02 ENCOUNTER — Telehealth (INDEPENDENT_AMBULATORY_CARE_PROVIDER_SITE_OTHER): Payer: BLUE CROSS/BLUE SHIELD | Admitting: Family Medicine

## 2022-10-02 DIAGNOSIS — Z6841 Body Mass Index (BMI) 40.0 and over, adult: Secondary | ICD-10-CM

## 2022-10-02 DIAGNOSIS — R7303 Prediabetes: Secondary | ICD-10-CM

## 2022-10-02 DIAGNOSIS — E559 Vitamin D deficiency, unspecified: Secondary | ICD-10-CM | POA: Diagnosis not present

## 2022-10-02 MED ORDER — METFORMIN HCL 500 MG PO TABS: 500.0000 mg | ORAL_TABLET | Freq: Two times a day (BID) | ORAL | 0 refills | Status: DC

## 2022-10-02 MED ORDER — TOPIRAMATE 25 MG PO TABS: 25.0000 mg | ORAL_TABLET | Freq: Every day | ORAL | 0 refills | Status: DC

## 2022-10-02 MED ORDER — VITAMIN D (ERGOCALCIFEROL) 1.25 MG (50000 UNIT) PO CAPS: 50000.0000 [IU] | ORAL_CAPSULE | ORAL | 0 refills | Status: DC

## 2022-10-02 NOTE — Progress Notes (Signed)
TeleHealth Visit:  This visit was completed with telemedicine (audio/video) technology. Lindsey Figueroa has verbally consented to this TeleHealth visit. The patient is located at home, the provider is located at home. The participants in this visit include the listed provider and patient. The visit was conducted today via MyChart video.  OBESITY Lindsey Figueroa is here to discuss her progress with her obesity treatment plan along with follow-up of her obesity related diagnoses.   Today's visit was # 10 Starting weight: 374 lbs Starting date: 04/10/22 Weight at last in office visit: 357 lbs on 09/11/22 Total weight loss: 17 lbs at last in office visit on 09/11/22. Today's reported weight: none reported  Nutrition Plan: the Category 2 plan   Current exercise: water aerobics 60 minutes 2 times per week, walks once weekly for 30 minutes.  Interim History:  She feels her weight loss has slowed down and she is concerned about this.  She lost her job in January and currently does not have health insurance.  She is actively searching for a job.  We discussed decreasing frequency of visits and she wants to do this.  Will schedule next visit for 8 weeks and provide 90-day supply of medications.  Metformin and topiramate working very well for appetite suppression.  She is actually sometimes eating breakfast and then forgets to eat lunch because she is not hungry.  She is hesitant to force herself to eat because in her mind she thinks she will gain weight. For breakfast she has coffee with half of a protein shake.  A frequent dinner she might have is salad with ground [place].  Protein intake adequate: no Skipping meals: Yes Drinking adequate water: No Drinking sugar sweetened beverages: No   Pharmacotherapy: Lindsey Figueroa is on Topiramate 25 mg daily in am. Adverse side effects:  Dysgeusia with soda Hunger is well controlled.  Cravings are well controlled.  Assessment/Plan:  1. Vitamin D Deficiency Vitamin  D is at goal of 50.  Most recent vitamin D level was 23.8 on 08/28/2022.Marland Kitchen She is on  prescription ergocalciferol 50,000 IU weekly. Lab Results  Component Value Date   VD25OH 23.8 (L) 08/28/2022   VD25OH 6.7 (L) 04/10/2022   VD25OH <7.00 (L) 02/17/2022    Plan: Refill prescription vitamin D 50,000 IU weekly..  2. Prediabetes Last A1c was 6.1 on 04/10/2022. Medication(s): Metformin 500 mg twice daily with meals Lab Results  Component Value Date   HGBA1C 6.1 (H) 04/10/2022   HGBA1C 6.2 02/17/2022   HGBA1C 6.0 07/24/2011   Lab Results  Component Value Date   INSULIN 9.9 04/10/2022   INSULIN 14.9 10/12/2020    Plan: Continue and refill Metformin 500 mg twice daily with meals  3. Morbid Obesity: Current BMI 55 Pharmacotherapy Plan Continue and refill  Topiramate 25 mg daily. Lindsey Figueroa is currently in the action stage of change. As such, her goal is to continue with weight loss efforts.  She has agreed to the Category 2 plan.  1.  Set alarm for lunch. 2.  Discussed that skipping meals will slow metabolism and impede weight loss. 3.  Eat protein first at all meals. 4.  Drink whole rather than half of a protein shake daily if skipping breakfast.  Exercise goals:  as is  Behavioral modification strategies: increasing lean protein intake, no meal skipping, meal planning , and planning for success.  Lindsey Figueroa has agreed to follow-up with our clinic in 8 weeks.   No orders of the defined types were placed in this encounter.  Medications Discontinued During This Encounter  Medication Reason   Vitamin D, Ergocalciferol, (DRISDOL) 1.25 MG (50000 UNIT) CAPS capsule Reorder   metFORMIN (GLUCOPHAGE) 500 MG tablet Reorder   topiramate (TOPAMAX) 25 MG tablet Reorder     Meds ordered this encounter  Medications   topiramate (TOPAMAX) 25 MG tablet    Sig: Take 1 tablet (25 mg total) by mouth daily.    Dispense:  90 tablet    Refill:  0    Order Specific Question:   Supervising  Provider    Answer:   Dell Ponto [[id number]]   metFORMIN (GLUCOPHAGE) 500 MG tablet    Sig: Take 1 tablet (500 mg total) by mouth 2 (two) times daily with a meal.    Dispense:  180 tablet    Refill:  0    Order Specific Question:   Supervising Provider    Answer:   Netty Starring   Vitamin D, Ergocalciferol, (DRISDOL) 1.25 MG (50000 UNIT) CAPS capsule    Sig: Take 1 capsule (50,000 Units total) by mouth every 7 (seven) days.    Dispense:  13 capsule    Refill:  0    Order Specific Question:   Supervising Provider    Answer:   Dell Ponto [[id number]]      Objective:   VITALS: Per patient if applicable, see vitals. GENERAL: Alert and in no acute distress. CARDIOPULMONARY: No increased WOB. Speaking in clear sentences.  PSYCH: Pleasant and cooperative. Speech normal rate and rhythm. Affect is appropriate. Insight and judgement are appropriate. Attention is focused, linear, and appropriate.  NEURO: Oriented as arrived to appointment on time with no prompting.   Attestation Statements:   Reviewed by clinician on day of visit: allergies, medications, problem list, medical history, surgical history, family history, social history, and previous encounter notes.   This was prepared with the assistance of Presenter, broadcasting.  Occasional wrong-word or sound-a-like substitutions may have occurred due to the inherent limitations of voice recognition software.

## 2022-10-07 ENCOUNTER — Other Ambulatory Visit (INDEPENDENT_AMBULATORY_CARE_PROVIDER_SITE_OTHER): Payer: Self-pay | Admitting: Internal Medicine

## 2022-10-07 DIAGNOSIS — R7303 Prediabetes: Secondary | ICD-10-CM

## 2022-11-27 ENCOUNTER — Ambulatory Visit (INDEPENDENT_AMBULATORY_CARE_PROVIDER_SITE_OTHER): Payer: BLUE CROSS/BLUE SHIELD | Admitting: Internal Medicine

## 2023-01-14 ENCOUNTER — Other Ambulatory Visit (INDEPENDENT_AMBULATORY_CARE_PROVIDER_SITE_OTHER): Payer: Self-pay | Admitting: Family Medicine

## 2023-01-14 DIAGNOSIS — R7303 Prediabetes: Secondary | ICD-10-CM

## 2023-02-24 ENCOUNTER — Telehealth: Payer: BLUE CROSS/BLUE SHIELD | Admitting: Family Medicine

## 2023-02-24 ENCOUNTER — Other Ambulatory Visit (HOSPITAL_BASED_OUTPATIENT_CLINIC_OR_DEPARTMENT_OTHER): Payer: Self-pay

## 2023-02-24 DIAGNOSIS — H00012 Hordeolum externum right lower eyelid: Secondary | ICD-10-CM

## 2023-02-24 MED ORDER — NEOMYCIN-POLYMYXIN-HC 3.5-10000-1 OP SUSP: 3.0000 [drp] | Freq: Four times a day (QID) | OPHTHALMIC | 0 refills | Status: AC

## 2023-02-24 NOTE — Progress Notes (Signed)
Virtual Visit Consent   Lindsey Figueroa, you are scheduled for a virtual visit with a [facility] provider today. Just as with appointments in the office, your consent must be obtained to participate. Your consent will be active for this visit and any virtual visit you may have with one of our providers in the next 365 days. If you have a MyChart account, a copy of this consent can be sent to you electronically.  As this is a virtual visit, video technology does not allow for your provider to perform a traditional examination. This may limit your provider's ability to fully assess your condition. If your provider identifies any concerns that need to be evaluated in person or the need to arrange testing (such as labs, EKG, etc.), we will make arrangements to do so. Although advances in technology are sophisticated, we cannot ensure that it will always work on either your end or our end. If the connection with a video visit is poor, the visit may have to be switched to a telephone visit. With either a video or telephone visit, we are not always able to ensure that we have a secure connection.  By engaging in this virtual visit, you consent to the provision of healthcare and authorize for your insurance to be billed (if applicable) for the services provided during this visit. Depending on your insurance coverage, you may receive a charge related to this service.  I need to obtain your verbal consent now. Are you willing to proceed with your visit today? Bevan Cryer Lozoya has provided verbal consent on 02/24/2023 for a virtual visit (video or telephone). Georgana Curio, FNP  Date: 02/24/2023 8:47 AM  Virtual Visit via Video Note   I, Georgana Curio, connected with  Judithann Ganoe Zeigler  ([id number], 1985/08/11) on 02/24/23 at  8:45 AM EDT by a video-enabled telemedicine application and verified that I am speaking with the correct person using two identifiers.  Location: Patient: home Provider: Virtual Visit  Location Provider: Home Office   I discussed the limitations of evaluation and management by telemedicine and the availability of in person appointments. The patient expressed understanding and agreed to proceed.    History of Present Illness: Lindsey Figueroa is a 37 y.o. who identifies as a female who was assigned female at birth, and is being seen today for swelling of rt lower eyelid worsening since last week. Sore and itches. She has been using warm compresses. Marland Kitchen  HPI: HPI  Problems:  Patient Active Problem List   Diagnosis Date Noted   OSA (obstructive sleep apnea)-suspected 07/18/2022   Hypertension, essential 05/08/2022   Suspected sleep apnea 05/08/2022   Vitamin D deficiency 05/08/2022   Class 3 severe obesity without serious comorbidity with body mass index (BMI) of 50.0 to 59.9 in adult [facility]) 04/24/2022   Prediabetes 04/11/2022   Depression screening 04/10/2022   SOBOE (shortness of breath on exertion) 04/10/2022   Other fatigue 04/10/2022   Insulin resistance 12/07/2020   Primary hypertension 09/06/2020   Chronic pain of both knees 09/06/2020   Elevated blood pressure reading 07/15/2020   Low back pain with radiation 07/08/2020   Lower extremity edema 07/08/2020   Preventative health care 12/21/2017   Class 3 severe obesity with serious comorbidity and body mass index (BMI) of 50.0 to 59.9 in adult [facility]) 09/22/2013   Vaginal discharge 05/20/2012   UTI symptoms 05/20/2012   Contraception management 04/03/2012   MORBID OBESITY 06/23/2010   HIP PAIN, LEFT 05/20/2010  Palpitation 05/20/2010    Allergies: No Known Allergies Medications:  Current Outpatient Medications:    amLODipine (NORVASC) 5 MG tablet, Take 1 tablet (5 mg total) by mouth daily., Disp: 90 tablet, Rfl: 1   metFORMIN (GLUCOPHAGE) 500 MG tablet, Take 1 tablet (500 mg total) by mouth 2 (two) times daily with a meal., Disp: 180 tablet, Rfl: 0   topiramate (TOPAMAX) 25 MG tablet, Take 1 tablet (25 mg  total) by mouth daily., Disp: 90 tablet, Rfl: 0   Vitamin D, Ergocalciferol, (DRISDOL) 1.25 MG (50000 UNIT) CAPS capsule, Take 1 capsule (50,000 Units total) by mouth every 7 (seven) days., Disp: 13 capsule, Rfl: 0  Observations/Objective: Patient is well-developed, well-nourished in no acute distress.  Resting comfortably  at home.  Head is normocephalic, atraumatic.  No labored breathing.  Speech is clear and coherent with logical content.  Patient is alert and oriented at baseline.  Stye visible rt lower eyelid.   Assessment and Plan: 1. Hordeolum externum of right lower eyelid  Continue warm compresses, med use and side effects per pharmacy, UC if sx worsen. Discard eye make eye and purchase new.   Follow Up Instructions: I discussed the assessment and treatment plan with the patient. The patient was provided an opportunity to ask questions and all were answered. The patient agreed with the plan and demonstrated an understanding of the instructions.  A copy of instructions were sent to the patient via MyChart unless otherwise noted below.     The patient was advised to call back or seek an in-person evaluation if the symptoms worsen or if the condition fails to improve as anticipated.  Time:  I spent 10 minutes with the patient via telehealth technology discussing the above problems/concerns.    Georgana Curio, FNP

## 2023-02-24 NOTE — Patient Instructions (Signed)
Stye A stye, also known as a hordeolum, is a bump that forms on an eyelid. It may look like a pimple next to the eyelash. A stye can form inside the eyelid (internal stye) or outside the eyelid (external stye). A stye can cause redness, swelling, and pain on the eyelid. Styes are very common. Anyone can get them at any age. They usually occur in just one eye at a time, but you may have more than one in either eye. What are the causes? A stye is caused by an infection. The infection is almost always caused by bacteria called Staphylococcus aureus. This is a common type of bacteria that lives on the skin. An internal stye may result from an infected oil-producing gland inside the eyelid. An external stye may be caused by an infection at the base of the eyelash (hair follicle). What increases the risk? You are more likely to develop a stye if: You have had a stye before. You have any of these conditions: Red, itchy, inflamed eyelids (blepharitis). A skin condition such as seborrheic dermatitis or rosacea. High fat levels in your blood (lipids). Dry eyes. What are the signs or symptoms? The most common symptom of a stye is eyelid pain. Internal styes are more painful than external styes. Other symptoms may include: Painful swelling of your eyelid. A scratchy feeling in your eye. Tearing and redness of your eye. A pimple-like bump on the edge of the eyelid. Pus draining from the stye. How is this diagnosed? Your health care provider may be able to diagnose a stye just by examining your eye. The health care provider may also check to make sure: You do not have a fever or other signs of a more serious infection. The infection has not spread to other parts of your eye or areas around your eye. How is this treated? Most styes will clear up in a few days without treatment or with warm compresses applied to the area. You may need to use antibiotic drops or ointment to treat an infection. Sometimes,  steroid drops or ointment are used in addition to antibiotics. In some cases, your health care provider may give you a small steroid injection in the eyelid. If your stye does not heal with routine treatment, your health care provider may drain pus from the stye using a thin blade or needle. This may be done if the stye is large, causing a lot of pain, or affecting your vision. Follow these instructions at home: Take over-the-counter and prescription medicines only as told by your health care provider. This includes eye drops or ointments. If you were prescribed an antibiotic medicine, steroid medicine, or both, apply or use them as told by your health care provider. Do not stop using the medicine even if your condition improves. Apply a warm, wet cloth (warm compress) to your eye for 5-10 minutes, 4 to 6 times a day. Clean the affected eyelid as directed by your health care provider. Do not wear contact lenses or eye makeup until your stye has healed and your health care provider says that it is safe. Do not try to pop or drain the stye. Do not rub your eye. Contact a health care provider if: You have chills or a fever. Your stye does not go away after several days. Your stye affects your vision. Your eyeball becomes swollen, red, or painful. Get help right away if: You have pain when moving your eye around. Summary A stye is a bump that forms   on an eyelid. It may look like a pimple next to the eyelash. A stye can form inside the eyelid (internal stye) or outside the eyelid (external stye). A stye can cause redness, swelling, and pain on the eyelid. Your health care provider may be able to diagnose a stye just by examining your eye. Apply a warm, wet cloth (warm compress) to your eye for 5-10 minutes, 4 to 6 times a day. This information is not intended to replace advice given to you by your health care provider. Make sure you discuss any questions you have with your health care  provider. Document Revised: 09/08/2020 Document Reviewed: 09/08/2020 Elsevier Patient Education  2024 [employer]  

## 2023-03-30 ENCOUNTER — Encounter: Payer: Self-pay | Admitting: Physician Assistant

## 2023-03-30 ENCOUNTER — Ambulatory Visit: Payer: BC Managed Care – PPO | Admitting: Physician Assistant

## 2023-03-30 VITALS — BP 136/82 | HR 57 | Temp 97.8°F | Resp 18 | Wt 364.0 lb

## 2023-03-30 DIAGNOSIS — R7303 Prediabetes: Secondary | ICD-10-CM | POA: Diagnosis not present

## 2023-03-30 DIAGNOSIS — Z6841 Body Mass Index (BMI) 40.0 and over, adult: Secondary | ICD-10-CM

## 2023-03-30 DIAGNOSIS — M25562 Pain in left knee: Secondary | ICD-10-CM | POA: Diagnosis not present

## 2023-03-30 NOTE — Progress Notes (Signed)
Established patient visit   Patient: Lindsey Figueroa   DOB: 05/26/1986   37 y.o. Female  MRN: [id number] Visit Date: 03/30/2023  Today's healthcare provider: Alfredia Ferguson, PA-C   Chief Complaint  Patient presents with   medication    Questions about [name]    Knee Pain    Left knee pain after work out    Subjective    HPI Discussed the use of AI scribe software for clinical note transcription with the patient, who gave verbal consent to proceed.  History of Present Illness   The patient, with a history of obesity, presents with left knee pain that has been discouraging them from working out. The pain is described as a tightening and stiffening sensation, particularly in the back of the knee.  The pain is severe enough to cause the patient to stand for 30 seconds before they can walk. The patient has been participating in water aerobics twice a week and working with a trainer doing full body workouts, but has had to stop jumping and running due to the pain. The patient also reports a feeling of needing to pop the knee, but it does not pop and continues to feel tight. The pain is present during water aerobics, but not as severe as during other workouts. The patient took a break from working out for a week, during which the pain was not present. The patient denies any noticeable swelling or bruising in the knee.  The patient also expresses frustration with their weight loss journey, feeling stuck in a cycle of losing and regaining the same 20-25 pounds. They express interest in trying [name], a weight loss injection, as they have seen their sister have success with it.       Medications: Outpatient Medications Prior to Visit  Medication Sig   amLODipine (NORVASC) 5 MG tablet Take 1 tablet (5 mg total) by mouth daily. (Patient not taking: Reported on 03/30/2023)   metFORMIN (GLUCOPHAGE) 500 MG tablet Take 1 tablet (500 mg total) by mouth 2 (two) times daily with a meal.  (Patient not taking: Reported on 03/30/2023)   topiramate (TOPAMAX) 25 MG tablet Take 1 tablet (25 mg total) by mouth daily. (Patient not taking: Reported on 03/30/2023)   Vitamin D, Ergocalciferol, (DRISDOL) 1.25 MG (50000 UNIT) CAPS capsule Take 1 capsule (50,000 Units total) by mouth every 7 (seven) days. (Patient not taking: Reported on 03/30/2023)   No facility-administered medications prior to visit.    Review of Systems  Constitutional:  Negative for fatigue and fever.  Respiratory:  Negative for cough and shortness of breath.   Cardiovascular:  Negative for chest pain and leg swelling.  Gastrointestinal:  Negative for abdominal pain.  Musculoskeletal:  Positive for arthralgias.  Neurological:  Negative for dizziness and headaches.      Objective    BP 136/82 (BP Location: Left Arm, Patient Position: Sitting, Cuff Size: Normal)   Pulse (!) 57   Temp 97.8 F (36.6 C) (Oral)   Resp 18   Wt (!) 364 lb (165.1 kg)   LMP 03/30/2023   SpO2 99%   BMI 57.01 kg/m   Physical Exam Vitals reviewed.  Constitutional:      Appearance: She is not ill-appearing.  HENT:     Head: Normocephalic.  Eyes:     Conjunctiva/sclera: Conjunctivae normal.  Cardiovascular:     Rate and Rhythm: Normal rate.  Pulmonary:     Effort: Pulmonary effort is normal. No respiratory distress.  Musculoskeletal:     Left knee: Normal. No swelling, deformity or effusion. Normal range of motion. No tenderness.     Comments: No L knee joint laxity appreciated  Neurological:     General: No focal deficit present.     Mental Status: She is alert and oriented to person, place, and time.  Psychiatric:        Mood and Affect: Mood normal.        Behavior: Behavior normal.      No results found for any visits on 03/30/23.  Assessment & Plan     1. Acute pain of left knee Pain with high impact exercises, relieved with rest.  -Use knee compression sleeve during exercise. -Perform knee stabilizing exercises  before high impact activities. -Consider further evaluation if pain persists despite modifications.  2. Morbid obesity with BMI of 50.0-59.9, adult (HCC) Difficulty losing weight despite lifestyle changes. Interested in weight loss medication, specifically [name]. -Check A1C and then will prescribe as indicated. -reviewed weight loss glp-1 MOA, SE, how to use, typical duration of use   3. Prediabetes - HgB A1c - Comp Met (CMET)         Return if symptoms worsen or fail to improve.      I, Alfredia Ferguson, PA-C have reviewed all documentation for this visit. The documentation on  03/30/23   for the exam, diagnosis, procedures, and orders are all accurate and complete.    Alfredia Ferguson, PA-C  [facility] at [facility] [phone] (phone) [phone] (fax)  [facility]

## 2023-03-31 LAB — COMPREHENSIVE METABOLIC PANEL
AG Ratio: 1.3 (calc) (ref 1.0–2.5)
ALT: 14 U/L (ref 6–29)
AST: 15 U/L (ref 10–30)
Albumin: 3.6 g/dL (ref 3.6–5.1)
Alkaline phosphatase (APISO): 62 U/L (ref 31–125)
BUN: 12 mg/dL (ref 7–25)
CO2: 20 mmol/L (ref 20–32)
Calcium: 8.6 mg/dL (ref 8.6–10.2)
Chloride: 106 mmol/L (ref 98–110)
Creat: 0.76 mg/dL (ref 0.50–0.97)
Globulin: 2.8 g/dL (ref 1.9–3.7)
Glucose, Bld: 98 mg/dL (ref 65–99)
Potassium: 4.3 mmol/L (ref 3.5–5.3)
Sodium: 136 mmol/L (ref 135–146)
Total Bilirubin: 0.3 mg/dL (ref 0.2–1.2)
Total Protein: 6.4 g/dL (ref 6.1–8.1)

## 2023-03-31 LAB — HEMOGLOBIN A1C
Hgb A1c MFr Bld: 5.9 %{Hb} — ABNORMAL HIGH (ref <5.7)
Mean Plasma Glucose: 123 mg/dL
eAG (mmol/L): 6.8 mmol/L

## 2023-04-02 ENCOUNTER — Other Ambulatory Visit: Payer: Self-pay | Admitting: Physician Assistant

## 2023-04-02 MED ORDER — TIRZEPATIDE-WEIGHT MANAGEMENT 2.5 MG/0.5ML ~~LOC~~ SOLN: 2.5000 mg | SUBCUTANEOUS | 0 refills | Status: DC

## 2023-04-03 ENCOUNTER — Other Ambulatory Visit: Payer: Self-pay | Admitting: Physician Assistant

## 2023-04-03 MED ORDER — TIRZEPATIDE-WEIGHT MANAGEMENT 2.5 MG/0.5ML ~~LOC~~ SOLN
2.5000 mg | SUBCUTANEOUS | 0 refills | Status: DC
Start: 2023-04-03 — End: 2023-07-03

## 2023-04-12 ENCOUNTER — Telehealth: Payer: Self-pay

## 2023-04-12 ENCOUNTER — Encounter: Payer: Self-pay | Admitting: Physician Assistant

## 2023-04-12 NOTE — Telephone Encounter (Signed)
Pharmacy Patient Advocate Encounter   Received notification from Patient Advice Request messages that prior authorization for [name] 2.5MG /0.5ML pen-injectors is required/requested.   Insurance verification completed.   The patient is insured through [facility] .   Per test claim: PA required; PA submitted to [employer] via CoverMyMeds Key/confirmation #/[facility] Status is pending

## 2023-04-13 ENCOUNTER — Encounter: Payer: Self-pay | Admitting: Physician Assistant

## 2023-04-13 NOTE — Telephone Encounter (Signed)
Pharmacy Patient Advocate Encounter  Received notification from [facility] that Prior Authorization for [name] 2.5MG /0.5ML pen-injectors has been DENIED.  Full denial letter will be uploaded to the media tab. See denial reason below.   PA #/Case ID/Reference #: [id number]   DENIAL REASON: Plan exclusion

## 2023-04-13 NOTE — Telephone Encounter (Signed)
FYI- plan exclusion. Plan doesn't cover weight loss medications.

## 2023-04-17 ENCOUNTER — Ambulatory Visit: Payer: BLUE CROSS/BLUE SHIELD | Admitting: Family Medicine

## 2023-05-01 ENCOUNTER — Telehealth: Payer: BC Managed Care – PPO | Admitting: Physician Assistant

## 2023-05-01 DIAGNOSIS — R109 Unspecified abdominal pain: Secondary | ICD-10-CM

## 2023-05-01 DIAGNOSIS — R110 Nausea: Secondary | ICD-10-CM

## 2023-05-01 NOTE — Progress Notes (Signed)
I reviewed the patients questionnaire and medical history. I then asked follow up questions to assist in my medical decision making.

## 2023-05-01 NOTE — Progress Notes (Signed)
Based on what you shared with me, I feel your condition warrants further evaluation as soon as possible at an Emergency department.    NOTE: There will be NO CHARGE for this eVisit   If you are having a true medical emergency please call 911.      Emergency Department-[facility]  Get Driving Directions  [phone]  [address]  [place], [place] [address]  Open 24/7/365      [facility] Emergency Department at [facility]  Get Driving Directions  [address]  [place], [place] [address]  Open 24/7/365    Emergency Department- [facility]  Get Driving Directions  [phone]  [address]  [place], [place] [address]  Open 24/7/365      [facility]  Get Driving Directions  [phone]  [address]  [place], [place] [address]  Open 24/7/365    [facility]  Emergency Department- [facility]  Get Driving Directions  [phone]  [address]  [place], [place] [address]  Open 24/7/365    [place]  Emergency Department- [facility]  Get Driving Directions  [address]  [place], [place] [address]  Open 24/7/365    [facility]  Emergency Department- [facility]  Get Driving Directions  [phone]  [address]  [place], [place] [address]  Open 24/7/365

## 2023-06-06 ENCOUNTER — Telehealth (INDEPENDENT_AMBULATORY_CARE_PROVIDER_SITE_OTHER): Payer: Self-pay | Admitting: Internal Medicine

## 2023-06-06 NOTE — Telephone Encounter (Signed)
Pt called in to see what she needed to do to rejoin the program. Educated pt program fee would be needed before scheduling. Pt agreed and stated she would come in to make the payment and schedule her appt.

## 2023-07-03 ENCOUNTER — Telehealth: Payer: BC Managed Care – PPO | Admitting: Physician Assistant

## 2023-07-03 DIAGNOSIS — H6991 Unspecified Eustachian tube disorder, right ear: Secondary | ICD-10-CM

## 2023-07-03 MED ORDER — IPRATROPIUM BROMIDE 0.03 % NA SOLN
2.0000 | Freq: Two times a day (BID) | NASAL | 0 refills | Status: DC
Start: 2023-07-03 — End: 2023-10-24

## 2023-07-03 MED ORDER — AZITHROMYCIN 250 MG PO TABS: ORAL_TABLET | ORAL | 0 refills | Status: AC

## 2023-07-03 NOTE — Progress Notes (Signed)
E-Visit for Ear Pain - Eustachian Tube Dysfunction   We are sorry that you are not feeling well. Here is how we plan to help!  Based on what you have shared with me it looks like you have Eustachian Tube Dysfunction.  Eustachian Tube Dysfunction is a condition where the tubes that connect your middle ears to your upper throat become blocked. This can lead to discomfort, hearing difficulties and a feeling of fullness in your ear. Eustachian tube dysfunction usually resolves itself in a few days. The usual symptoms include: Hearing problems Tinnitus, or ringing in your ears Clicking or popping sounds A feeling of fullness in your ears Pain that mimics an ear infection Dizziness, vertigo or balance problems A "tickling" sensation in your ears  ?Eustachian tube dysfunction symptoms may get worse in higher altitudes. This is called barotrauma, and it can happen while scuba diving, flying in an airplane or driving in the mountains.   What causes eustachian tube dysfunction? Allergies and infections (like the common cold and the flu) are the most common causes of eustachian tube dysfunction. These conditions can cause inflammation and mucus buildup, leading to blockage. GERD, or chronic acid reflux, can also cause ETD. This is because stomach acid can back up into your throat and result in inflammation. As mentioned above, altitude changes can also cause ETD.   What are some common eustachian tube dysfunction treatments? In most cases, treatment isn't necessary because ETD often resolves on its own. However, you might need treatment if your symptoms linger for more than two weeks.    Eustachian tube dysfunction treatment depends on the cause and the severity of your condition. Treatments may include home remedies, medications or, in severe cases, surgery.     HOME CARE: Sometimes simple home remedies can help with mild cases of eustachian tube dysfunction. To try and clear the blockage, you  can: Chew gum. Yawn. Swallow. Try the Valsalva maneuver (breathing out forcefully while closing your mouth and pinching your nostrils). Use a saline spray to clear out nasal passages.  MEDICATIONS: Over-the-counter medications can help if allergies are causing eustachian tube dysfunction. Try antihistamines (like cetirizine or diphenhydramine) to ease your symptoms. If you have discomfort, pain relievers -- such as acetaminophen or ibuprofen -- can help.  Sometimes intranasal glucocorticosteroids (like Flonase or Nasacort) help.  I have prescribed Ipratropium Bromide nasal spray 0.03% two sprays in each nostril 2-3 times a day. I am concerned for a secondary ear infection, so I have also added on an antibiotic, azithromycin, to take as directed.    GET HELP RIGHT AWAY IF: Fever is over 102.2 degrees. You develop progressive ear pain or hearing loss. Ear symptoms persist longer than 3 days after treatment.  MAKE SURE YOU: Understand these instructions. Will watch your condition. Will get help right away if you are not doing well or get worse.  Thank you for choosing an e-visit.  Your e-visit answers were reviewed by a board certified advanced clinical practitioner to complete your personal care plan. Depending upon the condition, your plan could have included both over the counter or prescription medications.  Please review your pharmacy choice. Make sure the pharmacy is open so you can pick up the prescription now. If there is a problem, you may contact your provider through [employer] and have the prescription routed to another pharmacy.  Your safety is important to [place]. If you have drug allergies check your prescription carefully.   For the next 24 hours you can  use MyChart to ask questions about today's visit, request a non-urgent call back, or ask for a work or school excuse. You will get an email with a survey after your eVisit asking about your experience. We would  appreciate your feedback. I hope that your e-visit has been valuable and will aid in your recovery.

## 2023-07-03 NOTE — Progress Notes (Signed)
I have spent 5 minutes in review of e-visit questionnaire, review and updating patient chart, medical decision making and response to patient.   Mia Milan Cody Jacklynn Dehaas, PA-C    

## 2023-07-06 ENCOUNTER — Emergency Department (HOSPITAL_BASED_OUTPATIENT_CLINIC_OR_DEPARTMENT_OTHER)
Admission: EM | Admit: 2023-07-06 | Discharge: 2023-07-06 | Disposition: A | Discharge status: Home / Self Care | Payer: BC Managed Care – PPO | Attending: Emergency Medicine | Admitting: Emergency Medicine

## 2023-07-06 ENCOUNTER — Encounter (HOSPITAL_BASED_OUTPATIENT_CLINIC_OR_DEPARTMENT_OTHER): Payer: Self-pay

## 2023-07-06 ENCOUNTER — Other Ambulatory Visit: Payer: Self-pay

## 2023-07-06 DIAGNOSIS — H9201 Otalgia, right ear: Secondary | ICD-10-CM | POA: Diagnosis not present

## 2023-07-06 DIAGNOSIS — Z79899 Other long term (current) drug therapy: Secondary | ICD-10-CM | POA: Diagnosis not present

## 2023-07-06 DIAGNOSIS — I10 Essential (primary) hypertension: Secondary | ICD-10-CM | POA: Insufficient documentation

## 2023-07-06 MED ORDER — CETIRIZINE-PSEUDOEPHEDRINE ER 5-120 MG PO TB12: 1.0000 | ORAL_TABLET | Freq: Every day | ORAL | 0 refills | Status: DC | PRN

## 2023-07-06 MED ORDER — CEFDINIR 300 MG PO CAPS: 300.0000 mg | ORAL_CAPSULE | Freq: Two times a day (BID) | ORAL | 0 refills | Status: DC

## 2023-07-06 NOTE — Discharge Instructions (Signed)
As discussed, need to have evidence of inner ear infection.  Will treat this with antibiotic in the form of cefdinir.  Take as directed.  Will also recommend allergy medicine with decongestant to help with your symptoms.  This is a decongestant you know what your blood pressure so please keep a watch on your blood pressure at home.  Please do not hesitate to return to emergency department if there are worrisome signs and symptoms we discussed become apparent.

## 2023-07-06 NOTE — ED Provider Notes (Signed)
[facility] EMERGENCY DEPARTMENT AT [facility] Provider Note   CSN: [id number] Arrival date & time: 07/06/23  1627     History  Chief Complaint  Patient presents with   Ear Pain    R    Lindsey Figueroa is a 37 y.o. female.  HPI   37 year old female presents emergency department with complaints of right-sided ear pain.  States that symptoms are present for the last 2 weeks or so.  Saw a provider over televisit 3 days ago who was concerned about an area ear infection and placed her on azithromycin.  Patient reports continued symptoms despite taking medication at home.  Denies any fever, nasal congestion, sore throat, chest pain, cough.  Past medical history significant for GERD, hypertension, obesity,  Home Medications Prior to Admission medications   Medication Sig Start Date End Date Taking? Authorizing Provider  cefdinir (OMNICEF) 300 MG capsule Take 1 capsule (300 mg total) by mouth 2 (two) times daily. 07/06/23  Yes Sherian Maroon A, PA  cetirizine-pseudoephedrine (ZYRTEC-D) 5-120 MG tablet Take 1 tablet by mouth daily as needed for allergies. 07/06/23  Yes Sherian Maroon A, PA  amLODipine (NORVASC) 5 MG tablet Take 1 tablet (5 mg total) by mouth daily. Patient not taking: Reported on 03/30/2023 06/16/22   Zola Button, Grayling Congress, DO  azithromycin [facility]) 250 MG tablet Take 2 tablets on day 1, then 1 tablet daily on days 2 through 5 07/03/23 07/08/23  Waldon Merl, PA-C  ipratropium (ATROVENT) 0.03 % nasal spray Place 2 sprays into both nostrils every 12 (twelve) hours. 07/03/23   Waldon Merl, PA-C  metFORMIN (GLUCOPHAGE) 500 MG tablet Take 1 tablet (500 mg total) by mouth 2 (two) times daily with a meal. Patient not taking: Reported on 03/30/2023 10/02/22   Whitmire, Dawn, FNP  topiramate (TOPAMAX) 25 MG tablet Take 1 tablet (25 mg total) by mouth daily. Patient not taking: Reported on 03/30/2023 10/02/22   Whitmire, Dawn, FNP  Vitamin D, Ergocalciferol,  (DRISDOL) 1.25 MG (50000 UNIT) CAPS capsule Take 1 capsule (50,000 Units total) by mouth every 7 (seven) days. Patient not taking: Reported on 03/30/2023 10/02/22   Adah Salvage, FNP      Allergies    Patient has no known allergies.    Review of Systems   Review of Systems  All other systems reviewed and are negative.   Physical Exam Updated Vital Signs BP (!) 168/99 (BP Location: Right Wrist)   Pulse 92   Temp 98.2 F (36.8 C) (Oral)   Resp 13   SpO2 100%  Physical Exam Vitals and nursing note reviewed.  Constitutional:      General: She is not in acute distress.    Appearance: She is well-developed.  HENT:     Head: Normocephalic and atraumatic.     Right Ear: Ear canal and external ear normal. Tympanic membrane is erythematous and bulging.     Left Ear: Tympanic membrane, ear canal and external ear normal.     Mouth/Throat:     Comments: Mild posterior pharyngeal edema.  Uvula midline and symmetric with phonation.  No sublingual or submandibular swelling.  No trismus.  Tonsils 1+ bilaterally without exudate. Eyes:     Conjunctiva/sclera: Conjunctivae normal.  Cardiovascular:     Rate and Rhythm: Normal rate and regular rhythm.     Heart sounds: No murmur heard. Pulmonary:     Effort: Pulmonary effort is normal. No respiratory distress.     Breath sounds: Normal breath  sounds.  Abdominal:     Palpations: Abdomen is soft.     Tenderness: There is no abdominal tenderness.  Musculoskeletal:        General: No swelling.     Cervical back: Neck supple.  Skin:    General: Skin is warm and dry.     Capillary Refill: Capillary refill takes less than 2 seconds.  Neurological:     Mental Status: She is alert.  Psychiatric:        Mood and Affect: Mood normal.     ED Results / Procedures / Treatments   Labs (all labs ordered are listed, but only abnormal results are displayed) Labs Reviewed - No data to display  EKG None  Radiology No results  found.  Procedures Procedures    Medications Ordered in ED Medications - No data to display  ED Course/ Medical Decision Making/ A&P                                 Medical Decision Making Risk OTC drugs. Prescription drug management.   This patient presents to the ED for concern of ear pain, this involves an extensive number of treatment options, and is a complaint that carries with it a high risk of complications and morbidity.  The differential diagnosis includes otitis media, otitis externa, foreign body, perforated TM, cellulitis, abscess, Ramsay Hunt, other   Co morbidities that complicate the patient evaluation  See HPI   Additional history obtained:  Additional history obtained from EMR External records from outside source obtained and reviewed including hospital records   Lab Tests:  N/a   Imaging Studies ordered:  N/a   Cardiac Monitoring: / EKG:  The patient was maintained on a cardiac monitor.  I personally viewed and interpreted the cardiac monitored which showed an underlying rhythm of: sinus rhythm   Consultations Obtained:  N/a   Problem List / ED Course / Critical interventions / Medication management  Ear pain Reevaluation of the patient showed that the patient stayed the same I have reviewed the patients home medicines and have made adjustments as needed   Social Determinants of Health:  Denies tobacco, illicit drug use   Test / Admission - Considered:  Ear pain Vitals signs significant for hypertension. Otherwise within normal range and stable throughout visit. 37 year old female presents emergency department complaints of right-sided ear pain for the past 2 weeks.  Pain refractory to azithromycin prescribed 3 days ago.  On exam, patient with erythematous appearing TM with serous fluid level behind.  No evidence of TM perforation.  External ear as well as external auditory canal unremarkable.  Patient's symptoms concerning for  otitis media.  Given duration of patient's symptoms, will begin antibiotics.  Recommend follow-up with PCP for reevaluation.  Treatment plan discussed length with patient and she acknowledged understanding was agreeable to said plan.  Patient overall well-appearing, afebrile in no acute distress. Worrisome signs and symptoms were discussed with the patient, and the patient acknowledged understanding to return to the ED if noticed. Patient was stable upon discharge.          Final Clinical Impression(s) / ED Diagnoses Final diagnoses:  Right ear pain    Rx / DC Orders ED Discharge Orders          Ordered    cefdinir (OMNICEF) 300 MG capsule  2 times daily        07/06/23 1654  cetirizine-pseudoephedrine (ZYRTEC-D) 5-120 MG tablet  Daily PRN        07/06/23 1654              Peter Garter, [place] 07/06/23 1659    Rolan Bucco, MD 07/06/23 2340

## 2023-07-06 NOTE — ED Notes (Signed)
 RN reviewed discharge instructions with pt. Pt verbalized understanding and had no further questions. VSS upon discharge.  

## 2023-07-06 NOTE — ED Triage Notes (Signed)
1wk before thanksgiving "what I thought was a common cold- lasted until approx 12/9, then pressure in my R ear like I was under water." Televisit approx earlier in the week, on prophylactic abx for ear infection x3 days.   No home remedies helping

## 2023-08-10 ENCOUNTER — Telehealth: Payer: BC Managed Care – PPO | Admitting: Family Medicine

## 2023-08-10 DIAGNOSIS — J111 Influenza due to unidentified influenza virus with other respiratory manifestations: Secondary | ICD-10-CM

## 2023-08-10 MED ORDER — OSELTAMIVIR PHOSPHATE 75 MG PO CAPS: 75.0000 mg | ORAL_CAPSULE | Freq: Two times a day (BID) | ORAL | 0 refills | Status: AC

## 2023-08-10 NOTE — Progress Notes (Signed)

## 2023-09-25 DIAGNOSIS — Z0289 Encounter for other administrative examinations: Secondary | ICD-10-CM

## 2023-10-21 ENCOUNTER — Telehealth: Admitting: Family

## 2023-10-21 DIAGNOSIS — J029 Acute pharyngitis, unspecified: Secondary | ICD-10-CM | POA: Diagnosis not present

## 2023-10-21 MED ORDER — AMOXICILLIN 500 MG PO CAPS: 500.0000 mg | ORAL_CAPSULE | Freq: Two times a day (BID) | ORAL | 0 refills | Status: AC

## 2023-10-21 NOTE — Progress Notes (Signed)
E-Visit for Sore Throat - Strep Symptoms ? ?We are sorry that you are not feeling well.  Here is how we plan to help! ? ?Based on what you have shared with me it is likely that you have strep pharyngitis.  Strep pharyngitis is inflammation and infection in the back of the throat.  This is an infection cause by bacteria and is treated with antibiotics.  I have prescribed Amoxicillin 500 mg twice a day for 10 days. For throat pain, we recommend over the counter oral pain relief medications such as acetaminophen or aspirin, or anti-inflammatory medications such as ibuprofen or naproxen sodium. Topical treatments such as oral throat lozenges or sprays may be used as needed. Strep infections are not as easily transmitted as other respiratory infections, however we still recommend that you avoid close contact with loved ones, especially the very young and elderly.  Remember to wash your hands thoroughly throughout the day as this is the number one way to prevent the spread of infection and wipe down door knobs and counters with disinfectant. ? ? ?Home Care: ?Only take medications as instructed by your medical team. ?Complete the entire course of an antibiotic. ?Do not take these medications with alcohol. ?A steam or ultrasonic humidifier can help congestion.  You can place a towel over your head and breathe in the steam from hot water coming from a faucet. ?Avoid close contacts especially the very young and the elderly. ?Cover your mouth when you cough or sneeze. ?Always remember to wash your hands. ? ?Get Help Right Away If: ?You develop worsening fever or sinus pain. ?You develop a severe head ache or visual changes. ?Your symptoms persist after you have completed your treatment plan. ? ?Make sure you ?Understand these instructions. ?Will watch your condition. ?Will get help right away if you are not doing well or get worse. ? ? ?Thank you for choosing an e-visit. ? ?Your e-visit answers were reviewed by a board  certified advanced clinical practitioner to complete your personal care plan. Depending upon the condition, your plan could have included both over the counter or prescription medications. ? ?Please review your pharmacy choice. Make sure the pharmacy is open so you can pick up prescription now. If there is a problem, you may contact your provider through MyChart messaging and have the prescription routed to another pharmacy.  Your safety is important to us. If you have drug allergies check your prescription carefully.  ? ?For the next 24 hours you can use MyChart to ask questions about today's visit, request a non-urgent call back, or ask for a work or school excuse. ?You will get an email in the next two days asking about your experience. I hope that your e-visit has been valuable and will speed your recovery. ? ?Approximately 5 minutes was spent documenting and reviewing patient's chart.  ? ? ?

## 2023-10-24 ENCOUNTER — Ambulatory Visit (INDEPENDENT_AMBULATORY_CARE_PROVIDER_SITE_OTHER): Admitting: Internal Medicine

## 2023-10-24 ENCOUNTER — Encounter (INDEPENDENT_AMBULATORY_CARE_PROVIDER_SITE_OTHER): Payer: Self-pay | Admitting: Internal Medicine

## 2023-10-24 VITALS — BP 134/86 | HR 64 | Temp 98.0°F | Ht 67.0 in | Wt 368.0 lb

## 2023-10-24 DIAGNOSIS — Z1331 Encounter for screening for depression: Secondary | ICD-10-CM

## 2023-10-24 DIAGNOSIS — R5383 Other fatigue: Secondary | ICD-10-CM

## 2023-10-24 DIAGNOSIS — E88819 Insulin resistance, unspecified: Secondary | ICD-10-CM

## 2023-10-24 DIAGNOSIS — I10 Essential (primary) hypertension: Secondary | ICD-10-CM

## 2023-10-24 DIAGNOSIS — R0602 Shortness of breath: Secondary | ICD-10-CM | POA: Diagnosis not present

## 2023-10-24 DIAGNOSIS — R7303 Prediabetes: Secondary | ICD-10-CM

## 2023-10-24 DIAGNOSIS — E559 Vitamin D deficiency, unspecified: Secondary | ICD-10-CM

## 2023-10-24 DIAGNOSIS — E66813 Obesity, class 3: Secondary | ICD-10-CM

## 2023-10-24 DIAGNOSIS — R29818 Other symptoms and signs involving the nervous system: Secondary | ICD-10-CM | POA: Diagnosis not present

## 2023-10-24 DIAGNOSIS — Z6841 Body Mass Index (BMI) 40.0 and over, adult: Secondary | ICD-10-CM

## 2023-10-24 NOTE — Progress Notes (Unsigned)
 [address] [place],  [place], [place] [address]  Office: [phone]  /  Fax: [phone]   Subjective   Initial Visit  Lindsey Figueroa (MR# [id number]) is a 38 y.o. female who presents for evaluation and treatment of obesity and related comorbidities.  She was a former patient and is returning to care.  She had lost insurance coverage.  Current BMI is Body mass index is 57.64 kg/m. Lindsey Figueroa has been struggling with her weight for many years and has been unsuccessful in either losing weight, maintaining weight loss, or reaching her healthy weight goal.  Lindsey Figueroa is currently in the action stage of change and ready to dedicate time achieving and maintaining a healthier weight. Lindsey Figueroa is interested in becoming our patient and working on intensive lifestyle modifications including (but not limited to) diet and exercise for weight loss.  When asked how their weight has affected their life and health, she states: Has affected self-esteem, Contributed to medical problems, Having fatigue, Having poor endurance, and Has affected mood   When asked what else they would like to accomplish? She states: Adopt healthier eating patterns, Improve energy levels and physical activity, Improve existing medical conditions, Improve quality of life, and Improve self-confidence  Weight history:  She starting to note weight gain during : adulthood and about 5 years ago .  Life events associated with weight gain include :  Traumatic life events .   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  Their highest weight has been:  391 lbs.  Desired weight: 250  Previous weight-loss programs : Ketogenic and Atkins, Dr. Fraser Din Program (Whole foods) .  Their maximum weight loss was:  60 lbs.  Their greatest challenge with dieting:  difficulty maintaining reduced calorie state.  Current or previous pharmacotherapy: Topiramate.  Response to medication:  tried compounded  GLP-1 for 3 dosages, too expensive at a weight loss    Nutritional History:  Current nutrition plan: Portion control / smart choices.  How many times do you eat outside the home: > 7 per week  How often do they skip meals:  sometimes breakfast  What beverages do they drink: water, caffeinated beverages , juice, smoothies, sweet tea , and alcohol, type: red wine, 1 drinks per week.   Use of artificial sweetners : Yes  Food intolerances or dislikes: none.  Food triggers: Stress, Boredom, Seeking reward, and To help comfort self.  Food cravings: Sugary and Starches / Carbohydrates  Do they struggle with excessive hunger or portion control : Yes    Physical Activity:  Current level of physical activity: None and Other: plus sedentary job  Barriers to Exercise:  no desire to workout but enjoys working    Past medical history includes:   Past Medical History:  Diagnosis Date   Anxiety    Back pain    Bilateral swelling of feet and ankles    Chewing difficulty    Depression    Edema    GERD (gastroesophageal reflux disease)    Hyperlipemia    Hypertension    Joint pain    Lactose intolerance    Obesity    Other fatigue    Palpitations    Prediabetes    Sciatica    Shortness of breath on exertion    SOB (shortness of breath)    STD (sexually transmitted disease)    Chlamydia  Swallowing difficulty    Vitamin D deficiency      Objective   BP 134/86   Pulse 64   Temp 98 F (36.7 C)   Ht 5\' 7"  (1.702 m)   Wt (!) 368 lb (166.9 kg)   LMP 10/15/2023   SpO2 100%   BMI 57.64 kg/m  She was weighed on the bioimpedance scale: Body mass index is 57.64 kg/m.    Anthropometrics:  Vitals Temp: 98 F (36.7 C) BP: 134/86 Pulse Rate: 64 SpO2: 100 %   Anthropometric Measurements Height: 5\' 7"  (1.702 m) Weight: (!)  368 lb (166.9 kg) BMI (Calculated): 57.62 Starting Weight: 368 lb Peak Weight: 391 lb Waist Measurement : 57 inches   Body Composition  Body Fat %: 56.1 % Fat Mass (lbs): 206 lbs Muscle Mass (lbs): 153.6 lbs Total Body Water (lbs): 114.4 lbs Visceral Fat Rating : 22   Other Clinical Data RMR: [id number] Fasting: yes Labs: yes Today's Visit #: 1 Starting Date: 10/24/22    Physical Exam:  General: She is overweight, cooperative, alert, well developed, and in no acute distress. PSYCH: Has normal mood, affect and thought process.   HEENT: EOMI, sclerae are anicteric. Lungs: Normal breathing effort, no conversational dyspnea. Extremities: No edema.  Neurologic: No gross sensory or motor deficits. No tremors or fasciculations noted.    Diagnostic Data Reviewed  EKG: Normal sinus, rate 59. No conduction abnormalities, abnormal Q waves or chamber enlargement.  Indirect Calorimeter completed today shows a VO2 of 327 and a REE of 2261.  Her calculated basal metabolic rate is 8295 thus her resting energy expenditure slower than calculated.  Depression Screen  Lindsey Figueroa's PHQ-9 score was: 19.     04/10/2022    9:12 AM  Depression screen PHQ 2/9  Decreased Interest 3  Down, Depressed, Hopeless 3  PHQ - 2 Score 6  Altered sleeping 1  Tired, decreased energy 3  Change in appetite 3  Feeling bad or failure about yourself  3  Trouble concentrating 2  Moving slowly or fidgety/restless 1  Suicidal thoughts 0  PHQ-9 Score 19  Difficult doing work/chores Very difficult    Screening for Sleep Related Breathing Disorders  Lindsey Figueroa admits to daytime somnolence and admits to waking up still tired. Patient has a history of symptoms of daytime fatigue. Lindsey Figueroa generally gets 5 hours of sleep per night, and states that she does not sleep well most nights. Snoring is present. Apneic episodes are present. Epworth Sleepiness Score is 3.   BMET    Component Value Date/Time   NA 136 03/30/2023  1425   NA 138 04/10/2022 1047   K 4.3 03/30/2023 1425   CL 106 03/30/2023 1425   CO2 20 03/30/2023 1425   GLUCOSE 98 03/30/2023 1425   BUN 12 03/30/2023 1425   BUN 9 04/10/2022 1047   CREATININE 0.76 03/30/2023 1425   CALCIUM 8.6 03/30/2023 1425   GFRNONAA >60 03/17/2020 1424   GFRAA >60 03/17/2020 1424   Lab Results  Component Value Date   HGBA1C 5.9 (H) 03/30/2023   HGBA1C 6.0 07/24/2011   Lab Results  Component Value Date   INSULIN 9.9 04/10/2022   INSULIN 14.9 10/12/2020   CBC    Component Value Date/Time   WBC 7.0 04/10/2022 1047   WBC 8.2 02/17/2022 1004   RBC 5.08 04/10/2022 1047   RBC 5.13 (H) 02/17/2022 1004   HGB 12.1 04/10/2022 1047   HCT 38.9 04/10/2022 1047   PLT 236.0 02/17/2022 1004  MCV 77 (L) 04/10/2022 1047   MCH 23.8 (L) 04/10/2022 1047   MCH 23.8 (L) 03/17/2020 1424   MCHC 31.1 (L) 04/10/2022 1047   MCHC 32.4 02/17/2022 1004   RDW 16.0 (H) 04/10/2022 1047   Iron/TIBC/Ferritin/ %Sat No results found for: "IRON", "TIBC", "FERRITIN", "IRONPCTSAT" Lipid Panel     Component Value Date/Time   CHOL 157 04/10/2022 1047   TRIG 123 04/10/2022 1047   HDL 54 04/10/2022 1047   CHOLHDL 2 02/17/2022 1004   VLDL 10.4 02/17/2022 1004   LDLCALC 81 04/10/2022 1047   LDLCALC 61 12/21/2017 1509   Hepatic Function Panel     Component Value Date/Time   PROT 6.4 03/30/2023 1425   PROT 6.7 04/10/2022 1047   ALBUMIN 3.8 (L) 04/10/2022 1047   AST 15 03/30/2023 1425   ALT 14 03/30/2023 1425   ALKPHOS 82 04/10/2022 1047   BILITOT 0.3 03/30/2023 1425   BILITOT 0.3 04/10/2022 1047      Component Value Date/Time   TSH 1.780 04/10/2022 1047     Assessment and Plan   TREATMENT PLAN FOR OBESITY:  Recommended Dietary Goals  Takayla is currently in the action stage of change. As such, her goal is to implement medically supervised weight loss plan.  She has agreed to implement: a tailored, multi-day, AI generated meal plan targeting 1800 kcal and 120 -150  grams of protein per day, the Category 4 plan - 1800 kcal per day, and 90-120  Behavioral Intervention  We discussed the following Behavioral Modification Strategies today: increasing lean protein intake to established goals, decreasing simple carbohydrates , increasing vegetables, increasing lower glycemic fruits, increasing fiber rich foods, avoiding skipping meals, increasing water intake, work on meal planning and preparation, reading food labels , keeping healthy foods at home, identifying sources and decreasing liquid calories, decreasing eating out or consumption of processed foods, and making healthy choices when eating convenient foods, planning for success, and better snacking choices  Additional resources provided today: Handout on healthy eating and balanced plate, Handout on complex carbohydrates and lean sources of protein, and Category 4 packet  Recommended Physical Activity Goals  Perlie has been advised to work up to 150 minutes of moderate intensity aerobic activity a week and strengthening exercises 2-3 times per week for cardiovascular health, weight loss maintenance and preservation of muscle mass.   She has agreed to :  Think about enjoyable ways to increase daily physical activity and overcoming barriers to exercise and Increase physical activity in their day and reduce sedentary time (increase NEAT).  Pharmacotherapy We will work on building a Therapist, art and behavioral strategies. We will discuss the role of pharmacotherapy as an adjunct at subsequent visits.   ASSOCIATED CONDITIONS ADDRESSED TODAY  Other Fatigue Lindsey Figueroa admits to daytime somnolence and admits to waking up still tired. Patient has a history of symptoms of daytime fatigue. Lindsey Figueroa generally gets 5 hours of sleep per night, and states that she does not sleep well most nights. Snoring is present. Apneic episodes are present. Epworth Sleepiness Score is 3. . Lindsey Figueroa does feel that her  weight is causing her energy to be lower than it should be. Fatigue may be related to obesity, depression or many other causes. Labs will be ordered, and in the meanwhile, Lindsey Figueroa will focus on self care including making healthy food choices, increasing physical activity and focusing on stress reduction.  Shortness of Breath Lindsey Figueroa notes increasing shortness of breath with exercising and seems to be worsening over time  with weight gain. She notes getting out of breath sooner with activity than she used to. This has not gotten worse recently. Lindsey Figueroa denies shortness of breath at rest or orthopnea.Lindsey Figueroa notes increasing shortness of breath with exercising and seems to be worsening over time with weight gain. She notes getting out of breath sooner with activity than she used to. This has not gotten worse recently. Lindsey Figueroa denies shortness of breath at rest or orthopnea.  SOB (shortness of breath) on exertion  Depression screening  Other fatigue -     EKG 12-Lead -     CBC with Differential/Platelet -     Vitamin B12  Suspected sleep apnea Assessment & Plan: We had referred patient previously for sleep study but because of lack of insurance she was unable to do so.  We will place referral at the next visit.  She has an Epworth of around 3 but does have some daytime fatigue loud snoring and gasping for air at night.  She also has an elevated blood pressure and high risk phenotype.   Prediabetes Assessment & Plan: Most recent A1c is  Lab Results  Component Value Date   HGBA1C 5.9 (H) 03/30/2023   HGBA1C 6.0 07/24/2011    Patient aware of disease state and risk of progression. This may contribute to abnormal cravings, fatigue and diabetic complications without having diabetes.   We have discussed treatment options which include: losing 7 to 10% of body weight, increasing physical activity to a goal of 150 minutes a week at moderate intensity.  Advised to maintain a diet low on simple and  processed carbohydrates.  She may also be a candidate for pharmacoprophylaxis with metformin or incretin mimetic.    Orders: -     Hemoglobin A1c -     Insulin, random -     Lipid Panel With LDL/HDL Ratio  Vitamin D deficiency Assessment & Plan: Patient has a history of vitamin D deficiency which is related to excess adiposity and may result in leptin resistance.  Were checking vitamin D levels today and recommend supplementation based on test results.  Orders: -     VITAMIN D 25 Hydroxy (Vit-D Deficiency, Fractures)  Insulin resistance Assessment & Plan: Previously on metformin. Patient educated on the carb insulin model for obesity She will be started on a reduced calorie low-carb moderate protein meal plan Consider pharmacoprophylaxis with GLP-1 if not cost prohibitive or metformin.   Class 3 severe obesity without serious comorbidity with body mass index (BMI) of 50.0 to 59.9 in adult, unspecified obesity type Lindsey Figueroa) Assessment & Plan: Patient is returning to care overall I think she is on a good job maintaining her weight after period of absence.  Please refer to obesity treatment plan.  She will be working on implementation of reduced calorie nutrition plan.  Orders: -     Hemoglobin A1c -     Insulin, random -     TSH  Hypertension, essential Assessment & Plan: She is no longer taking blood pressure medication had been on amlodipine in the past her blood pressure today is slightly elevated.  Were checking renal parameters today reevaluate need for antihypertensives at the next visit.  Losing 10% of body weight may improve blood pressure control.  She also benefits from further screening for sleep apnea.  Orders: -     [id number]+EGFR    Follow-up  She was informed of the importance of frequent follow-up visits to maximize her success with intensive lifestyle modifications for her  multiple health conditions. She was informed we would discuss her lab results at her next  visit unless there is a critical issue that needs to be addressed sooner. Klaire agreed to keep her next visit at the agreed upon time to discuss these results.  Attestation Statement  This is the patient's intake visit at [facility]. The patient's Health Questionnaire was reviewed at length. Included in the packet: current and past health history, medications, allergies, ROS, gynecologic history (women only), surgical history, family history, social history, weight history, weight loss surgery history (for those that have had weight loss surgery), nutritional evaluation, mood and food questionnaire, PHQ9, Epworth questionnaire, sleep habits questionnaire, patient life and health improvement goals questionnaire. These will all be scanned into the patient's chart under media.   During the visit, I independently reviewed the patient's EKG, previous labs, bioimpedance scale results, and indirect calorimetry results. I used this information to medically tailor a meal plan for the patient that will help her to lose weight and will improve her obesity-related conditions. I performed a medically necessary appropriate examination and/or evaluation. I discussed the assessment and treatment plan with the patient. The patient was provided an opportunity to ask questions and all were answered. The patient agreed with the plan and demonstrated an understanding of the instructions. Labs were ordered at this visit and will be reviewed at the next visit unless critical results need to be addressed immediately. Clinical information was updated and documented in the EMR.   In addition, they received basic education on identification of processed foods and reduction of these, different sources of lean proteins and complex carbohydrates and how to eat balanced by incorporation of whole foods.  Reviewed by clinician on day of visit: allergies, medications, problem list, medical history, surgical history,  family history, social history, and previous encounter notes.  I have spent 60 minutes in the care of the patient today including: {EMTIMEBILLING:29254::"preparing to see patient (e.g. review and interpretation of tests, old notes )","obtaining and/or reviewing separately obtained history","performing a medically appropriate examination or evaluation","counseling and educating the patient","documenting clinical information in the electronic or other health care record","independently interpreting results and communicating results to the patient, family, or caregiver"}      Worthy Rancher, MD

## 2023-10-24 NOTE — Assessment & Plan Note (Signed)
 Previously on metformin. Patient educated on the carb insulin model for obesity She will be started on a reduced calorie low-carb moderate protein meal plan Consider pharmacoprophylaxis with GLP-1 if not cost prohibitive or metformin.

## 2023-10-24 NOTE — Assessment & Plan Note (Signed)
 Patient is returning to care overall I think she is on a good job maintaining her weight after period of absence.  Please refer to obesity treatment plan.  She will be working on implementation of reduced calorie nutrition plan.

## 2023-10-24 NOTE — Assessment & Plan Note (Signed)
 We had referred patient previously for sleep study but because of lack of insurance she was unable to do so.  We will place referral at the next visit.  She has an Epworth of around 3 but does have some daytime fatigue loud snoring and gasping for air at night.  She also has an elevated blood pressure and high risk phenotype.

## 2023-10-24 NOTE — Assessment & Plan Note (Signed)
 Patient has a history of vitamin D deficiency which is related to excess adiposity and may result in leptin resistance.  Were checking vitamin D levels today and recommend supplementation based on test results.

## 2023-10-24 NOTE — Assessment & Plan Note (Signed)
 Most recent A1c is  Lab Results  Component Value Date   HGBA1C 5.9 (H) 03/30/2023   HGBA1C 6.0 07/24/2011    Patient aware of disease state and risk of progression. This may contribute to abnormal cravings, fatigue and diabetic complications without having diabetes.   We have discussed treatment options which include: losing 7 to 10% of body weight, increasing physical activity to a goal of 150 minutes a week at moderate intensity.  Advised to maintain a diet low on simple and processed carbohydrates.  She may also be a candidate for pharmacoprophylaxis with metformin or incretin mimetic.

## 2023-10-24 NOTE — Assessment & Plan Note (Addendum)
 She is no longer taking blood pressure medication had been on amlodipine in the past her blood pressure today is slightly elevated.  Were checking renal parameters today reevaluate need for antihypertensives at the next visit.  Losing 10% of body weight may improve blood pressure control.  She also benefits from further screening for sleep apnea.

## 2023-10-25 LAB — CBC WITH DIFFERENTIAL/PLATELET
Basophils Absolute: 0 10*3/uL (ref 0.0–0.2)
Basos: 1 %
EOS (ABSOLUTE): 0.1 10*3/uL (ref 0.0–0.4)
Eos: 2 %
Hematocrit: 38.9 % (ref 34.0–46.6)
Hemoglobin: 12.6 g/dL (ref 11.1–15.9)
Immature Grans (Abs): 0 10*3/uL (ref 0.0–0.1)
Immature Granulocytes: 0 %
Lymphocytes Absolute: 2.8 10*3/uL (ref 0.7–3.1)
Lymphs: 47 %
MCH: 24.3 pg — ABNORMAL LOW (ref 26.6–33.0)
MCHC: 32.4 g/dL (ref 31.5–35.7)
MCV: 75 fL — ABNORMAL LOW (ref 79–97)
Monocytes Absolute: 0.3 10*3/uL (ref 0.1–0.9)
Monocytes: 6 %
Neutrophils Absolute: 2.6 10*3/uL (ref 1.4–7.0)
Neutrophils: 44 %
Platelets: 275 10*3/uL (ref 150–450)
RBC: 5.18 x10E6/uL (ref 3.77–5.28)
RDW: 15.7 % — ABNORMAL HIGH (ref 11.7–15.4)
WBC: 5.9 10*3/uL (ref 3.4–10.8)

## 2023-10-25 LAB — LIPID PANEL WITH LDL/HDL RATIO
Cholesterol, Total: 171 mg/dL (ref 100–199)
HDL: 67 mg/dL (ref >39)
LDL Chol Calc (NIH): 88 mg/dL (ref 0–99)
LDL/HDL Ratio: 1.3 ratio (ref 0.0–3.2)
Triglycerides: 84 mg/dL (ref 0–149)
VLDL Cholesterol Cal: 16 mg/dL (ref 5–40)

## 2023-10-25 LAB — CMP14+EGFR
ALT: 16 IU/L (ref 0–32)
AST: 21 IU/L (ref 0–40)
Albumin: 4.1 g/dL (ref 3.9–4.9)
Alkaline Phosphatase: 80 IU/L (ref 44–121)
BUN/Creatinine Ratio: 12 (ref 9–23)
BUN: 8 mg/dL (ref 6–24)
Bilirubin Total: 0.3 mg/dL (ref 0.0–1.2)
CO2: 20 mmol/L (ref 20–29)
Calcium: 9 mg/dL (ref 8.7–10.2)
Chloride: 103 mmol/L (ref 96–106)
Creatinine, Ser: 0.65 mg/dL (ref 0.57–1.00)
Globulin, Total: 2.7 g/dL (ref 1.5–4.5)
Glucose: 92 mg/dL (ref 70–99)
Potassium: 4.3 mmol/L (ref 3.5–5.2)
Sodium: 139 mmol/L (ref 134–144)
Total Protein: 6.8 g/dL (ref 6.0–8.5)
eGFR: 114 mL/min/{1.73_m2} (ref >59)

## 2023-10-25 LAB — TSH: TSH: 2.31 u[IU]/mL (ref 0.450–4.500)

## 2023-10-25 LAB — HEMOGLOBIN A1C
Est. average glucose Bld gHb Est-mCnc: 123 mg/dL
Hgb A1c MFr Bld: 5.9 % — ABNORMAL HIGH (ref 4.8–5.6)

## 2023-10-25 LAB — VITAMIN D 25 HYDROXY (VIT D DEFICIENCY, FRACTURES): Vit D, 25-Hydroxy: 13.1 ng/mL — ABNORMAL LOW (ref 30.0–100.0)

## 2023-10-25 LAB — INSULIN, RANDOM: INSULIN: 14.1 u[IU]/mL (ref 2.6–24.9)

## 2023-10-25 LAB — VITAMIN B12: Vitamin B-12: 500 pg/mL (ref 232–1245)

## 2023-11-07 ENCOUNTER — Encounter (INDEPENDENT_AMBULATORY_CARE_PROVIDER_SITE_OTHER): Payer: Self-pay | Admitting: Internal Medicine

## 2023-11-07 ENCOUNTER — Ambulatory Visit (INDEPENDENT_AMBULATORY_CARE_PROVIDER_SITE_OTHER): Admitting: Internal Medicine

## 2023-11-07 ENCOUNTER — Other Ambulatory Visit (HOSPITAL_BASED_OUTPATIENT_CLINIC_OR_DEPARTMENT_OTHER): Payer: Self-pay

## 2023-11-07 VITALS — BP 136/84 | HR 72 | Temp 97.9°F | Ht 67.0 in | Wt 366.0 lb

## 2023-11-07 DIAGNOSIS — R7303 Prediabetes: Secondary | ICD-10-CM

## 2023-11-07 DIAGNOSIS — I10 Essential (primary) hypertension: Secondary | ICD-10-CM

## 2023-11-07 DIAGNOSIS — R29818 Other symptoms and signs involving the nervous system: Secondary | ICD-10-CM

## 2023-11-07 DIAGNOSIS — E66813 Obesity, class 3: Secondary | ICD-10-CM

## 2023-11-07 DIAGNOSIS — Z6841 Body Mass Index (BMI) 40.0 and over, adult: Secondary | ICD-10-CM

## 2023-11-07 DIAGNOSIS — E559 Vitamin D deficiency, unspecified: Secondary | ICD-10-CM | POA: Diagnosis not present

## 2023-11-07 DIAGNOSIS — E611 Iron deficiency: Secondary | ICD-10-CM | POA: Insufficient documentation

## 2023-11-07 DIAGNOSIS — E88819 Insulin resistance, unspecified: Secondary | ICD-10-CM | POA: Diagnosis not present

## 2023-11-07 MED ORDER — FERROUS SULFATE 325 (65 FE) MG PO TABS: 325.0000 mg | ORAL_TABLET | Freq: Every day | ORAL | 0 refills | Status: DC

## 2023-11-07 MED ORDER — VITAMIN D (ERGOCALCIFEROL) 1.25 MG (50000 UNIT) PO CAPS: 50000.0000 [IU] | ORAL_CAPSULE | ORAL | 0 refills | Status: DC

## 2023-11-07 MED ORDER — ZEPBOUND 2.5 MG/0.5ML ~~LOC~~ SOAJ
2.5000 mg | SUBCUTANEOUS | 0 refills | Status: DC
  Filled 2023-11-07 – 2023-11-08 (×3): qty 2, 28d supply, fill #0

## 2023-11-07 NOTE — Assessment & Plan Note (Signed)
 Most recent A1c is  Lab Results  Component Value Date   HGBA1C 5.9 (H) 10/24/2023   HGBA1C 6.0 07/24/2011    Patient aware of disease state and risk of progression. This may contribute to abnormal cravings, fatigue and diabetic complications without having diabetes.   We have discussed treatment options which include: losing 7 to 10% of body weight, increasing physical activity to a goal of 150 minutes a week at moderate intensity.  Advised to maintain a diet low on simple and processed carbohydrates.  She may also be a candidate for pharmacoprophylaxis with metformin  or incretin mimetic.

## 2023-11-07 NOTE — Assessment & Plan Note (Signed)
 She has some microcytic indices on her CBC but no anemia.  She has menstrual blood flow and may have iron deficiency.  She is not interested in further testing but is agreeable to taking ferrous sulfate  325 once daily for 1 month.  Consider checking iron studies and repeat CBC in 2 months.

## 2023-11-07 NOTE — Assessment & Plan Note (Signed)
 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 Optimal level < 1.9.  But this has improved from last year  This is complex condition associated with genetics, ectopic fat and lifestyle factors. Insulin  resistance may result in increased fat storage, inhibition of the breakdown of fat, cause fluctuations in blood sugar leading to energy crashes and increased cravings for sugary or high carb foods and cause metabolic slowdown making it difficult to lose weight.  This may result in additional weight gain and lead to pre-diabetes and diabetes if untreated. In addition, hyperinsulinemia increases cardiovascular risk, chronic inflammatory response and may increase the risk of obesity related malignancies.  Lab Results  Component Value Date   HGBA1C 5.9 (H) 10/24/2023   Lab Results  Component Value Date   INSULIN  14.1 10/24/2023   INSULIN  9.9 04/10/2022   INSULIN  14.9 10/12/2020   Lab Results  Component Value Date   GLUCOSE 92 10/24/2023   GLUCOSE 106 (H) 07/13/2009    We reviewed treatment options which include losing 7 to 10% of body weight, increasing volume of physical activity and maintaining a diet low in saturated fats and with a low glycemic load.  Patient has also been educated on the carb insulin  model of obesity.  She may also be a candidate for pharmacoprophylaxis with metformin  and / or GLP1 medication.  She will be prescribed [name]  2.5 mg once a week for pharmacoprophylaxis

## 2023-11-07 NOTE — Progress Notes (Signed)
 Office: [phone]  /  Fax: [phone]  Weight Summary And Biometrics  Vitals Temp: 97.9 F (36.6 C) BP: 136/84 Pulse Rate: 72 SpO2: 100 %   Anthropometric Measurements Height: 5\' 7"  (1.702 m) Weight: (!) 366 lb (166 kg) BMI (Calculated): 57.31 Weight at Last Visit: 368 lb Weight Lost Since Last Visit: 2 lb Weight Gained Since Last Visit: 0 lb Starting Weight: 368 lb Total Weight Loss (lbs): 2 lb (0.907 kg) Peak Weight: 391 lb   Body Composition  Body Fat %: 55.7 % Fat Mass (lbs): 203.8 lbs Muscle Mass (lbs): 154 lbs Total Body Water (lbs): 116.2 lbs Visceral Fat Rating : 22    RMR: [id number]  Today's Visit #: 2  Starting Date: 10/24/22   Subjective   Chief Complaint: Obesity  Interval History Discussed the use of AI scribe software for clinical note transcription with the patient, who gave verbal consent to proceed.  History of Present Illness   Lindsey Figueroa is a 38 year old female who presents for medical weight management.  She is reestablishing care for weight management and was previously started on an 1800 calorie meal plan, which she follows about 50-60% of the time. She has lost two pounds since the last visit and incorporates more whole foods and the recommended amount of protein. She does not skip meals but is not currently exercising.  She has a history of prediabetes with a previous A1c as high as 6.2, currently at 5.9. She was previously on metformin , which helped with hunger and fullness, and she believes she was on it concurrently with another medication that made soda taste flat, leading her to stop drinking it.  She has a history of sleep apnea with symptoms including gasping and episodes of stopped breathing during sleep. Her neck size is 14.25 inches, and she has a history of snoring.  She is not currently on any blood pressure medication and has a blood pressure machine at home. She has previously avoided phentermine  due to concerns  about blood pressure.  She reports a vitamin D  deficiency and has been on high-dose vitamin D  once a week for four months in the past. She also has a suspected iron deficiency based on small red blood cell size, though she is not anemic.  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 She enjoys swimming and walking but finds it challenging to fit exercise into her schedule due to work commitments.          Challenges affecting patient progress: work schedule and low volume of physical activity at present .    Pharmacotherapy for weight management: She is currently taking no anti-obesity medication and had been on metformin  and topiramate  in the past .   Assessment and Plan   Treatment Plan For Obesity:  Recommended Dietary Goals  Lindsey Figueroa is currently in the action stage of change. As such, her goal is to continue weight management plan. She has agreed to: continue current plan  Behavioral Health and Counseling  We discussed the following behavioral modification strategies today: continue to work on maintaining a reduced calorie state, getting the recommended amount of protein, incorporating whole foods, making healthy choices, staying well hydrated and practicing mindfulness when eating..  Additional education and resources provided today: Handout on FDA approved anti-obesity medications and adverse effects  Recommended Physical Activity Goals  Lindsey Figueroa has been advised to work up to 150 minutes of moderate intensity aerobic activity a week and  strengthening exercises 2-3 times per week for cardiovascular health, weight loss maintenance and preservation of muscle mass.   She has agreed to :  Think about enjoyable ways to increase daily physical activity and overcoming barriers to exercise and Increase physical activity in their day and reduce sedentary time (increase NEAT).  Pharmacotherapy  We discussed various medication options to help Lindsey Figueroa  with her weight loss efforts and we both agreed to : start anti-obesity medication.  In addition to reduced calorie nutrition plan (RCNP), behavioral strategies and physical activity, Lindsey Figueroa would benefit from pharmacotherapy to assist with hunger signals, satiety and cravings. This will reduce obesity-related health risks by inducing weight loss, and help reduce food consumption and adherence to Lindsey Figueroa) . It may also improve QOL by improving self-confidence and reduce the  setbacks associated with metabolic adaptations.  Patient also has prediabetes, insulin  resistance, hypertension and is suspected to have sleep apnea.  These are conditions that increase her risk of future complications and are sensitive to weight loss.  She therefore benefits from pharmacotherapy with GLP-1.  After discussion of treatment options, mechanisms of action, benefits, side effects, contraindications and shared decision making she is agreeable to starting [name]  2.5 mg once a week. Patient also made aware that medication is indicated for long-term management of obesity and the risk of weight regain following discontinuation of treatment and hence the importance of adhering to medical weight loss plan.  We demonstrated use of device and patient using teach back method was able to demonstrate proper technique.  Associated Conditions Impacted by Obesity Treatment  Insulin  resistance Assessment & Plan: Her HOMA-IR is 3.2 which is elevated. Optimal level < 1.9.  But this has improved from last year  This is complex condition associated with genetics, ectopic fat and lifestyle factors. Insulin  resistance may result in increased fat storage, inhibition of the breakdown of fat, cause fluctuations in blood sugar leading to energy crashes and increased cravings for sugary or high carb foods and cause metabolic slowdown making it difficult to lose weight.  This may result in additional weight gain and lead to pre-diabetes and  diabetes if untreated. In addition, hyperinsulinemia increases cardiovascular risk, chronic inflammatory response and may increase the risk of obesity related malignancies.  Lab Results  Component Value Date   HGBA1C 5.9 (H) 10/24/2023   Lab Results  Component Value Date   INSULIN  14.1 10/24/2023   INSULIN  9.9 04/10/2022   INSULIN  14.9 10/12/2020   Lab Results  Component Value Date   GLUCOSE 92 10/24/2023   GLUCOSE 106 (H) 07/13/2009    We reviewed treatment options which include losing 7 to 10% of body weight, increasing volume of physical activity and maintaining a diet low in saturated fats and with a low glycemic load.  Patient has also been educated on the carb insulin  model of obesity.  She may also be a candidate for pharmacoprophylaxis with metformin  and / or GLP1 medication.  She will be prescribed [name]  2.5 mg once a week for pharmacoprophylaxis    Class 3 severe obesity with serious comorbidity and body mass index (BMI) of 50.0 to 59.9 in adult, unspecified obesity type [facility]) Assessment & Plan: See obesity treatment plan.  We reviewed labs in detail she has some nutritional deficiencies as outlined.  She is starting to lose weight with implementation of reduced calorie nutrition plan.  I feel that she benefits from early use of antiobesity medications to assist with orexigenic signaling.  She has been on metformin  and topiramate   in the past, but considering degree of obesity and associated comorbidities we will see if her insurance covers GLP-1.  If not then she will be started on low-dose phentermine and topiramate .   Orders: -     [name] ; Inject 2.5 mg into the skin once a week.  Dispense: 2 mL; Refill: 0 -     Ambulatory referral to Sleep Studies  Prediabetes Assessment & Plan: Most recent A1c is  Lab Results  Component Value Date   HGBA1C 5.9 (H) 10/24/2023   HGBA1C 6.0 07/24/2011    Patient aware of disease state and risk of progression. This may  contribute to abnormal cravings, fatigue and diabetic complications without having diabetes.   We have discussed treatment options which include: losing 7 to 10% of body weight, increasing physical activity to a goal of 150 minutes a week at moderate intensity.  Advised to maintain a diet low on simple and processed carbohydrates.  She may also be a candidate for pharmacoprophylaxis with metformin  or incretin mimetic.    Orders: -     [name] ; Inject 2.5 mg into the skin once a week.  Dispense: 2 mL; Refill: 0  Suspected sleep apnea Assessment & Plan: We had referred patient previously for sleep study but because of lack of insurance she was unable to do so. She has an Epworth of around 3 but does have some daytime fatigue loud snoring and gasping for air at night.  She also has an elevated blood pressure and high risk phenotype.  Her neck circumference is 14.5 inches she has a Mallampati of 2-3.  Considering symptomatology I recommend polysomnography.  She will be referred to [facility]  Orders: -     [name] ; Inject 2.5 mg into the skin once a week.  Dispense: 2 mL; Refill: 0 -     Ambulatory referral to Sleep Studies  Hypertension, essential Assessment & Plan: Patient is returning to care she apparently discontinue use of amlodipine .  Her blood pressure today slightly above target.  She is not interested in restarting medication.  We will try low-dose phentermine if her insurance does not cover GLP-1 but we need to closely monitor her blood pressure.  She may need to start antihypertensives to be able to tolerate sympathomimetic.  Orders: -     [name] ; Inject 2.5 mg into the skin once a week.  Dispense: 2 mL; Refill: 0 -     Ambulatory referral to Sleep Studies  Vitamin D  deficiency Assessment & Plan: Most recent vitamin D  levels  Lab Results  Component Value Date   VD25OH 13.1 (L) 10/24/2023   VD25OH 23.8 (L) 08/28/2022   VD25OH 6.7 (L) 04/10/2022      Deficiency state associated with adiposity and may result in leptin resistance, weight gain and fatigue.   Plan: After discussion of benefits, alternative treatment options and side effects patient will be started on vitamin D2 50,000 units 1 tablet weekly for 3-4 months. for a treatment goal level of 50-60 mg/dl. Check levels at that time for response monitoring.   Orders: -     Vitamin D  (Ergocalciferol ); Take 1 capsule (50,000 Units total) by mouth every 7 (seven) days.  Dispense: 16 capsule; Refill: 0  Iron deficiency Assessment & Plan: She has some microcytic indices on her CBC but no anemia.  She has menstrual blood flow and may have iron deficiency.  She is not interested in further testing but is agreeable to taking ferrous sulfate  325 once daily for 1  month.  Consider checking iron studies and repeat CBC in 2 months.  Orders: -     Ferrous Sulfate ; Take 1 tablet (325 mg total) by mouth daily with breakfast.  Dispense: 30 tablet; Refill: 0       Objective   Physical Exam:  Blood pressure 136/84, pulse 72, temperature 97.9 F (36.6 C), height 5\' 7"  (1.702 m), weight (!) 366 lb (166 kg), last menstrual period 11/05/2023, SpO2 100%. Body mass index is 57.32 kg/m.  General: She is overweight, cooperative, alert, well developed, and in no acute distress. PSYCH: Has normal mood, affect and thought process.   HEENT: EOMI, sclerae are anicteric. Lungs: Normal breathing effort, no conversational dyspnea. Extremities: No edema.  Neurologic: No gross sensory or motor deficits. No tremors or fasciculations noted.    Diagnostic Data Reviewed:  BMET    Component Value Date/Time   NA 139 10/24/2023 1031   K 4.3 10/24/2023 1031   CL 103 10/24/2023 1031   CO2 20 10/24/2023 1031   GLUCOSE 92 10/24/2023 1031   GLUCOSE 98 03/30/2023 1425   BUN 8 10/24/2023 1031   CREATININE 0.65 10/24/2023 1031   CREATININE 0.76 03/30/2023 1425   CALCIUM 9.0 10/24/2023 1031   GFRNONAA >60  03/17/2020 1424   GFRAA >60 03/17/2020 1424   Lab Results  Component Value Date   HGBA1C 5.9 (H) 10/24/2023   HGBA1C 6.0 07/24/2011   Lab Results  Component Value Date   INSULIN  14.1 10/24/2023   INSULIN  14.9 10/12/2020   Lab Results  Component Value Date   TSH 2.310 10/24/2023   CBC    Component Value Date/Time   WBC 5.9 10/24/2023 1031   WBC 8.2 02/17/2022 1004   RBC 5.18 10/24/2023 1031   RBC 5.13 (H) 02/17/2022 1004   HGB 12.6 10/24/2023 1031   HCT 38.9 10/24/2023 1031   PLT 275 10/24/2023 1031   MCV 75 (L) 10/24/2023 1031   MCH 24.3 (L) 10/24/2023 1031   MCH 23.8 (L) 03/17/2020 1424   MCHC 32.4 10/24/2023 1031   MCHC 32.4 02/17/2022 1004   RDW 15.7 (H) 10/24/2023 1031   Iron Studies No results found for: "IRON", "TIBC", "FERRITIN", "IRONPCTSAT" Lipid Panel     Component Value Date/Time   CHOL 171 10/24/2023 1031   TRIG 84 10/24/2023 1031   HDL 67 10/24/2023 1031   CHOLHDL 2 02/17/2022 1004   VLDL 10.4 02/17/2022 1004   LDLCALC 88 10/24/2023 1031   LDLCALC 61 12/21/2017 1509   Hepatic Function Panel     Component Value Date/Time   PROT 6.8 10/24/2023 1031   ALBUMIN 4.1 10/24/2023 1031   AST 21 10/24/2023 1031   ALT 16 10/24/2023 1031   ALKPHOS 80 10/24/2023 1031   BILITOT 0.3 10/24/2023 1031      Component Value Date/Time   TSH 2.310 10/24/2023 1031   Nutritional Lab Results  Component Value Date   VD25OH 13.1 (L) 10/24/2023   VD25OH 23.8 (L) 08/28/2022   VD25OH 6.7 (L) 04/10/2022    Medications: Outpatient Encounter Medications as of 11/07/2023  Medication Sig   ferrous sulfate  325 (65 FE) MG tablet Take 1 tablet (325 mg total) by mouth daily with breakfast.   tirzepatide  ([name] ) 2.5 MG/0.5ML Pen Inject 2.5 mg into the skin once a week.   Vitamin D , Ergocalciferol , (DRISDOL ) 1.25 MG (50000 UNIT) CAPS capsule Take 1 capsule (50,000 Units total) by mouth every 7 (seven) days.   [DISCONTINUED] amLODipine  (NORVASC ) 5 MG tablet Take 1  tablet (5 mg total) by mouth daily. (Patient not taking: Reported on 11/07/2023)   [DISCONTINUED] Vitamin D , Ergocalciferol , (DRISDOL ) 1.25 MG (50000 UNIT) CAPS capsule Take 1 capsule (50,000 Units total) by mouth every 7 (seven) days. (Patient not taking: Reported on 11/07/2023)   No facility-administered encounter medications on file as of 11/07/2023.     Follow-Up   Return for Schedule future 3-4 week follow-up.Aaron Aas She was informed of the importance of frequent follow up visits to maximize her success with intensive lifestyle modifications for her multiple health conditions.  Attestation Statement   Reviewed by clinician on day of visit: allergies, medications, problem list, medical history, surgical history, family history, social history, and previous encounter notes.     Ladd Picker, MD

## 2023-11-07 NOTE — Assessment & Plan Note (Signed)
 Most recent vitamin D  levels  Lab Results  Component Value Date   VD25OH 13.1 (L) 10/24/2023   VD25OH 23.8 (L) 08/28/2022   VD25OH 6.7 (L) 04/10/2022     Deficiency state associated with adiposity and may result in leptin resistance, weight gain and fatigue.   Plan: After discussion of benefits, alternative treatment options and side effects patient will be started on vitamin D2 50,000 units 1 tablet weekly for 3-4 months. for a treatment goal level of 50-60 mg/dl. Check levels at that time for response monitoring.

## 2023-11-07 NOTE — Assessment & Plan Note (Signed)
 See obesity treatment plan.  We reviewed labs in detail she has some nutritional deficiencies as outlined.  She is starting to lose weight with implementation of reduced calorie nutrition plan.  I feel that she benefits from early use of antiobesity medications to assist with orexigenic signaling.  She has been on metformin  and topiramate  in the past, but considering degree of obesity and associated comorbidities we will see if her insurance covers GLP-1.  If not then she will be started on low-dose phentermine and topiramate .

## 2023-11-07 NOTE — Assessment & Plan Note (Signed)
 Patient is returning to care she apparently discontinue use of amlodipine .  Her blood pressure today slightly above target.  She is not interested in restarting medication.  We will try low-dose phentermine if her insurance does not cover GLP-1 but we need to closely monitor her blood pressure.  She may need to start antihypertensives to be able to tolerate sympathomimetic.

## 2023-11-07 NOTE — Assessment & Plan Note (Addendum)
 We had referred patient previously for sleep study but because of lack of insurance she was unable to do so. She has an Epworth of around 3 but does have some daytime fatigue loud snoring and gasping for air at night.  She also has an elevated blood pressure and high risk phenotype.  Her neck circumference is 14.5 inches she has a Mallampati of 2-3.  Considering symptomatology I recommend polysomnography.  She will be referred to [facility]

## 2023-11-08 ENCOUNTER — Other Ambulatory Visit (HOSPITAL_BASED_OUTPATIENT_CLINIC_OR_DEPARTMENT_OTHER): Payer: Self-pay

## 2023-11-08 ENCOUNTER — Encounter (INDEPENDENT_AMBULATORY_CARE_PROVIDER_SITE_OTHER): Payer: Self-pay

## 2023-11-08 ENCOUNTER — Telehealth (INDEPENDENT_AMBULATORY_CARE_PROVIDER_SITE_OTHER): Payer: Self-pay

## 2023-11-08 NOTE — Telephone Encounter (Signed)
 Sent pt message, unable to verify insurance ( check eligibility).  Advised pt to confirm info with insurance company and then confirm with pharmacy

## 2023-11-12 NOTE — Telephone Encounter (Signed)
 Patient called insurance and had it updated, however, only be approved with a dx of OSA and she does not have.  Let her know I will not complete the PA, it will not be approved and she may discuss at next ov

## 2023-12-04 ENCOUNTER — Encounter: Payer: Self-pay | Admitting: Neurology

## 2023-12-04 ENCOUNTER — Ambulatory Visit (INDEPENDENT_AMBULATORY_CARE_PROVIDER_SITE_OTHER): Admitting: Neurology

## 2023-12-04 ENCOUNTER — Institutional Professional Consult (permissible substitution): Admitting: Neurology

## 2023-12-04 VITALS — BP 149/82 | HR 63 | Ht 67.0 in | Wt 375.0 lb

## 2023-12-04 DIAGNOSIS — Z6841 Body Mass Index (BMI) 40.0 and over, adult: Secondary | ICD-10-CM

## 2023-12-04 DIAGNOSIS — Z820 Family history of epilepsy and other diseases of the nervous system: Secondary | ICD-10-CM | POA: Diagnosis not present

## 2023-12-04 DIAGNOSIS — Z9189 Other specified personal risk factors, not elsewhere classified: Secondary | ICD-10-CM

## 2023-12-04 DIAGNOSIS — R0681 Apnea, not elsewhere classified: Secondary | ICD-10-CM | POA: Diagnosis not present

## 2023-12-04 DIAGNOSIS — R0683 Snoring: Secondary | ICD-10-CM

## 2023-12-04 DIAGNOSIS — R351 Nocturia: Secondary | ICD-10-CM

## 2023-12-04 DIAGNOSIS — G4719 Other hypersomnia: Secondary | ICD-10-CM

## 2023-12-04 DIAGNOSIS — R519 Headache, unspecified: Secondary | ICD-10-CM

## 2023-12-04 NOTE — Patient Instructions (Signed)

## 2023-12-04 NOTE — Progress Notes (Signed)
 Subjective:    Patient ID: Lindsey Figueroa is a 38 y.o. female.  HPI    Debbra Fairy, MD, PhD [facility] [address], [address] [address] [place], [place] [address]  Dear Dr. Allie Area,  I saw your patient, Lindsey Figueroa, upon your kind request in my sleep clinic today for initial consultation of her sleep disorder, in particular, concern for underlying obstructive sleep apnea.  The patient is unaccompanied today.  As you know, Lindsey Figueroa is a 38 year old female with an underlying complex medical history of insulin  resistance, prediabetes, hypertension, hyperlipidemia, palpitations, vitamin D  deficiency, iron deficiency, anxiety, depression, edema, back pain/sciatica, reflux disease, joint pain, and morbid obesity with a BMI of over 50, who reports snoring and excessive daytime somnolence as well as witnessed apneas and waking up with a sense of gasping for air.  Her Epworth sleepiness score is 10 out of 24, fatigue severity score is 44 out of 63.  I reviewed your office note from 11/07/2023.  She reports a family history of sleep apnea affecting her mom, grandmother and uncle.  She lives with her mom, sister and niece.  She works as a Transport planner.  She is a non-smoker.  She drinks caffeine in the form of diet soda and coffee, altogether about 3-4 servings per day.  She drinks alcohol very occasionally, about once a month.  She does not watch TV in her bedroom.  She has no pets in the household.  She is working on weight loss.  Bedtime is between 9 and 11 and rise time around 6:30 AM.  She has nocturia about twice per average night and has had occasional morning headaches, dull and achy, typically in between her eyes or in the bifrontal areas.  She wears compression socks for lower extremity edema.  She is not currently on [name] .  Her Past Medical History Is Significant For: Past Medical History:  Diagnosis Date   Anxiety    Back pain    Bilateral  swelling of feet and ankles    Chewing difficulty    Depression    Edema    GERD (gastroesophageal reflux disease)    Hyperlipemia    Hypertension    Joint pain    Lactose intolerance    Obesity    Other fatigue    Palpitations    Prediabetes    Sciatica    Shortness of breath on exertion    SOB (shortness of breath)    STD (sexually transmitted disease)    Chlamydia   Swallowing difficulty    Vitamin D  deficiency     Her Past Surgical History Is Significant For: Past Surgical History:  Procedure Laterality Date   TONSILLECTOMY AND ADENOIDECTOMY     WISDOM TOOTH EXTRACTION  2018    Her Family History Is Significant For: Family History  Problem Relation Age of Onset   Diabetes Mother    Depression Mother    Sleep apnea Mother    High blood pressure Mother    Hypertension Father    High Cholesterol Father    Sleep apnea Maternal Grandmother    Hypertension Maternal Grandmother    Cancer - Lung Maternal Grandfather        Non smoker   Sleep apnea Maternal Uncle     Her Social History Is Significant For: Social History   Socioeconomic History   Marital status: Single    Spouse name: Not on file   Number of children: Not on  file   Years of education: Not on file   Highest education level: Not on file  Occupational History   Occupation: spa Event organiser: [employer]    Comment: [employer]  Tobacco Use   Smoking status: Never   Smokeless tobacco: Never  Vaping Use   Vaping status: Never Used  Substance and Sexual Activity   Alcohol use: No   Drug use: No   Sexual activity: Yes    Partners: Male    Birth control/protection: Condom    Comment: 1st intercourse 38 yo-More than 5 partners  Other Topics Concern   Not on file  Social History Narrative   Right handed   Lives with her mother, sister, and 1 yr old niece   Caffeine: 3 cups/day    Social Drivers of Corporate investment banker Strain: Not on file  Food Insecurity: Not on file   Transportation Needs: Not on file  Physical Activity: Not on file  Stress: Not on file  Social Connections: Not on file    Her Allergies Are:  No Known Allergies:   Her Current Medications Are:  Outpatient Encounter Medications as of 12/04/2023  Medication Sig   ferrous sulfate  325 (65 FE) MG tablet Take 1 tablet (325 mg total) by mouth daily with breakfast.   Vitamin D , Ergocalciferol , (DRISDOL ) 1.25 MG (50000 UNIT) CAPS capsule Take 1 capsule (50,000 Units total) by mouth every 7 (seven) days.   tirzepatide  ([name] ) 2.5 MG/0.5ML Pen Inject 2.5 mg into the skin once a week. (Patient not taking: Reported on 12/04/2023)   No facility-administered encounter medications on file as of 12/04/2023.  :   Review of Systems:  Out of a complete 14 point review of systems, all are reviewed and negative with the exception of these symptoms as listed below:  Review of Systems  Neurological:        Patient is here alone for sleep consult. She endorses snoring, gasping and stopping breathing in sleep. She states she wakes up constantly. She doesn't feel well-rested. She gets tired during the day. She has a family history of sleep apnea (mother, uncle, and grandmother). She denies any previous sleep study. ESS 10 FSS 44    Objective:  Neurological Exam  Physical Exam Physical Examination:   Vitals:   12/04/23 1245  BP: (!) 149/82  Pulse: 63    General Examination: The patient is a very pleasant 38 y.o. female in no acute distress. She appears well-developed and well-nourished and well groomed.   HEENT: Normocephalic, atraumatic, pupils are equal, round and reactive to light, extraocular tracking is good without limitation to gaze excursion or nystagmus noted. Hearing is grossly intact. Face is symmetric with normal facial animation. Speech is clear with no dysarthria noted. There is no hypophonia. There is no lip, neck/head, jaw or voice tremor. Neck is supple with full range of passive  and active motion. There are no carotid bruits on auscultation. Oropharynx exam reveals: mild mouth dryness, good dental hygiene and mild airway crowding, due to small airway entry and redundant soft palate, Mallampati class II, tonsils absent, mild overbite noted.  Neck circumference 15-5/8 inches.  Tongue protrudes centrally and palate elevates symmetrically.  Chest: Clear to auscultation without wheezing, rhonchi or crackles noted.  Heart: S1+S2+0, regular and normal without murmurs, rubs or gallops noted.   Abdomen: Soft, non-tender and non-distended.  Extremities: There is 1+ pitting edema in the distal lower extremities bilaterally.  Compression stockings in place.  Skin: Warm and dry without trophic changes noted.   Musculoskeletal: exam reveals no obvious joint deformities.   Neurologically:  Mental status: The patient is awake, alert and oriented in all 4 spheres. Her immediate and remote memory, attention, language skills and fund of knowledge are appropriate. There is no evidence of aphasia, agnosia, apraxia or anomia. Speech is clear with normal prosody and enunciation. Thought process is linear. Mood is normal and affect is normal.  Cranial nerves II - XII are as described above under HEENT exam.  Motor exam: Normal bulk, strength and tone is noted. There is no obvious action or resting tremor.  Fine motor skills and coordination: grossly intact.  Cerebellar testing: No dysmetria or intention tremor. There is no truncal or gait ataxia.  Sensory exam: intact to light touch in the upper and lower extremities.  Gait, station and balance: She stands without difficulty. No veering to one side is noted. No leaning to one side is noted. Posture is age-appropriate and stance is narrow based. Gait shows normal stride length and normal pace. No problems turning are noted.   Assessment and Plan:  In summary, Chava Dulac is a very pleasant 38 y.o.-year old female with an underlying  complex medical history of insulin  resistance, prediabetes, hypertension, hyperlipidemia, palpitations, vitamin D  deficiency, iron deficiency, anxiety, depression, edema, back pain/sciatica, reflux disease, palpitations, joint pain, and morbid obesity with a BMI of over 50, whose history and physical exam are concerning for sleep disordered breathing, particularly obstructive sleep apnea (OSA). A laboratory attended sleep study is typically considered "gold standard" for evaluation of sleep disordered breathing.   I had a long chat with the patient about my findings and the diagnosis of sleep apnea, particularly OSA, its prognosis and treatment options. We talked about medical/conservative treatments, surgical interventions and non-pharmacological approaches for symptom control. I explained, in particular, the risks and ramifications of untreated moderate to severe OSA, especially with respect to developing cardiovascular disease down the road, including congestive heart failure (CHF), difficult to treat hypertension, cardiac arrhythmias (particularly A-fib), neurovascular complications including TIA, stroke and dementia. Even type 2 diabetes has, in part, been linked to untreated OSA. Symptoms of untreated OSA may include (but may not be limited to) daytime sleepiness, nocturia (i.e. frequent nighttime urination), memory problems, mood irritability and suboptimally controlled or worsening mood disorder such as depression and/or anxiety, lack of energy, lack of motivation, physical discomfort, as well as recurrent headaches, especially morning or nocturnal headaches. We talked about the importance of maintaining a healthy lifestyle and striving for healthy weight.  In addition, we talked about the importance of striving for and maintaining good sleep hygiene. I recommended a sleep study at this time. I outlined the differences between a laboratory attended sleep study which is considered more comprehensive and  accurate over the option of a home sleep test (HST); the latter may lead to underestimation of sleep disordered breathing in some instances and does not help with diagnosing upper airway resistance syndrome and is not accurate enough to diagnose primary central sleep apnea typically. I outlined possible surgical and non-surgical treatment options of OSA, including the use of a positive airway pressure (PAP) device (i.e. CPAP, AutoPAP/APAP or BiPAP in certain circumstances), a custom-made dental device (aka oral appliance, which would require a referral to a specialist dentist or orthodontist typically, and is generally speaking not considered for patients with full dentures or edentulous state), upper airway surgical options, such as traditional UPPP (which is not considered a  first-line treatment) or the Inspire device (hypoglossal nerve stimulator, which would involve a referral for consultation with an ENT surgeon, after careful selection, following inclusion criteria - also not first-line treatment). I explained the PAP treatment option to the patient in detail, as this is generally considered first-line treatment.  The patient indicated that she would be willing to try PAP therapy, if the need arises. I explained the importance of being compliant with PAP treatment, not only for insurance purposes but primarily to improve patient's symptoms symptoms, and for the patient's long term health benefit, including to reduce Her cardiovascular risks longer-term.    We will pick up our discussion about the next steps and treatment options after testing.  We will keep her posted as to the test results by phone call and/or MyChart messaging where possible.  We will plan to follow-up in sleep clinic accordingly as well.  I answered all her questions today and the patient was in agreement.   I encouraged her to call with any interim questions, concerns, problems or updates or email us  through MyChart.  Generally  speaking, sleep test authorizations may take up to 2 weeks, sometimes less, sometimes longer, the patient is encouraged to get in touch with us  if they do not hear back from the sleep lab staff directly within the next 2 weeks.  Thank you very much for allowing me to participate in the care of this nice patient. If I can be of any further assistance to you please do not hesitate to call me at [phone].  Sincerely,   Debbra Fairy, MD, PhD

## 2023-12-09 ENCOUNTER — Other Ambulatory Visit (INDEPENDENT_AMBULATORY_CARE_PROVIDER_SITE_OTHER): Payer: Self-pay | Admitting: Internal Medicine

## 2023-12-09 DIAGNOSIS — E611 Iron deficiency: Secondary | ICD-10-CM

## 2023-12-12 ENCOUNTER — Other Ambulatory Visit: Payer: Self-pay | Admitting: Family Medicine

## 2023-12-12 ENCOUNTER — Encounter (INDEPENDENT_AMBULATORY_CARE_PROVIDER_SITE_OTHER): Payer: Self-pay | Admitting: Internal Medicine

## 2023-12-12 ENCOUNTER — Ambulatory Visit (INDEPENDENT_AMBULATORY_CARE_PROVIDER_SITE_OTHER): Admitting: Internal Medicine

## 2023-12-12 VITALS — BP 138/84 | HR 73 | Temp 98.2°F | Ht 67.0 in | Wt 370.0 lb

## 2023-12-12 DIAGNOSIS — Z1231 Encounter for screening mammogram for malignant neoplasm of breast: Secondary | ICD-10-CM

## 2023-12-12 DIAGNOSIS — I10 Essential (primary) hypertension: Secondary | ICD-10-CM

## 2023-12-12 DIAGNOSIS — Z6841 Body Mass Index (BMI) 40.0 and over, adult: Secondary | ICD-10-CM

## 2023-12-12 DIAGNOSIS — E66813 Obesity, class 3: Secondary | ICD-10-CM | POA: Diagnosis not present

## 2023-12-12 DIAGNOSIS — R7303 Prediabetes: Secondary | ICD-10-CM | POA: Diagnosis not present

## 2023-12-12 DIAGNOSIS — R29818 Other symptoms and signs involving the nervous system: Secondary | ICD-10-CM

## 2023-12-12 MED ORDER — PHENTERMINE HCL 37.5 MG PO TABS: 18.7500 mg | ORAL_TABLET | Freq: Every day | ORAL | 0 refills | Status: DC

## 2023-12-12 NOTE — Progress Notes (Unsigned)
 Office: [phone]  /  Fax: [phone]  Weight Summary And Biometrics  Vitals Temp: 98.2 F (36.8 C) BP: 138/84 Pulse Rate: 73 SpO2: 100 %   Anthropometric Measurements Height: 5\' 7"  (1.702 m) Weight: (!) 370 lb (167.8 kg) BMI (Calculated): 57.94 Weight at Last Visit: 366lb Weight Lost Since Last Visit: 0lb Weight Gained Since Last Visit: 4lb Starting Weight: 368lb Total Weight Loss (lbs): 0 lb (0 kg) Peak Weight: 391lb   Body Composition  Body Fat %: 55.9 % Fat Mass (lbs): 207.2 lbs Muscle Mass (lbs): 155.2 lbs Total Body Water (lbs): 116.6 lbs Visceral Fat Rating : 22    RMR: [id number]  Today's Visit #: 3  Starting Date: 10/24/23   Subjective   Chief Complaint: Obesity  Interval History Discussed the use of AI scribe software for clinical note transcription with the patient, who gave verbal consent to proceed.  History of Present Illness   Lindsey Figueroa is a 38 year old female who presents for medical weight management.  She has gained four pounds since her last visit, attributing this to increased hunger and stress eating. Hunger is rated as a six or seven on a scale of ten, and she tends to consume more carbohydrates like pasta and sweets, which do not satiate her hunger.  Her eating habits are inconsistent, maintaining her eating plan for two to three days, focusing on yogurts and milk, but reverting to less structured eating for four to five days due to work-related stress. Stress is rated as a seven on a scale of ten, primarily related to work, with no significant stressors at home.  She has not previously taken Qsymia or phentermine due to concerns about her blood pressure, which was previously high. She is concerned about maintaining her protein intake while managing her appetite.  She is awaiting confirmation from her insurance for a sleep study due to suspected sleep apnea, which could impact her weight management efforts. She has experienced  symptoms suggestive of sleep apnea   Challenges affecting patient progress: strong hunger signals and/or impaired satiety / inhibitory control, having difficulty preparing healthy meals, having difficulty with meal prep and planning, having difficulty focusing on healthy eating, difficulty implementing reduced calorie nutrition plan, difficulty maintaining a reduced calorie state, and low volume of physical activity at present .    Pharmacotherapy for weight management: She is currently taking no anti-obesity medication.  [name]  was not covered by her insurance  Assessment and Plan   Treatment Plan For Obesity:  Recommended Dietary Goals  [name] is currently in the action stage of change. As such, her goal is to continue weight management plan. She has agreed to: portion control, balanced plate and making smarter food choices, such as increasing vegetables, protein intake and reducing simple carbohydrates and processed foods  and continue to work on implementation of reduced calorie nutrition plan (RCNP)  Behavioral Health and Counseling  We discussed the following behavioral modification strategies today: continue to work on maintaining a reduced calorie state, getting the recommended amount of protein, incorporating whole foods, making healthy choices, staying well hydrated and practicing mindfulness when eating..  Additional education and resources provided today: None  Recommended Physical Activity Goals  [name] has been advised to work up to 150 minutes of moderate intensity aerobic activity a week and strengthening exercises 2-3 times per week for cardiovascular health, weight loss maintenance and preservation of muscle mass.   She has agreed to :  Think about enjoyable ways to increase daily  physical activity and overcoming barriers to exercise and Increase physical activity in their day and reduce sedentary time (increase NEAT).  Pharmacotherapy  We discussed various  medication options to help [facility] with her weight loss efforts and we both agreed to : start anti-obesity medication.  In addition to reduced calorie nutrition plan (RCNP), behavioral strategies and physical activity, Lachandra would benefit from pharmacotherapy to assist with hunger signals, satiety and cravings. This will reduce obesity-related health risks by inducing weight loss, and help reduce food consumption and adherence to [facility]) . It may also improve QOL by improving self-confidence and reduce the  setbacks associated with metabolic adaptations.  After discussion of treatment options, mechanisms of action, benefits, side effects, contraindications and shared decision making she is agreeable to starting phentermine 18.75 mg once a day. Patient also made aware that medication is indicated for long-term management of obesity and the risk of weight regain following discontinuation of treatment and hence the importance of adhering to medical weight loss plan.    1.  Controlled substance agreement reviewed and signed today 2.  Reviewed state registry for controlled substances  Associated Conditions Impacted by Obesity Treatment  Class 3 severe obesity with serious comorbidity and body mass index (BMI) of 50.0 to 59.9 in adult, unspecified obesity type -     Phentermine HCl; Take 0.5 tablets (18.75 mg total) by mouth daily before breakfast.  Dispense: 15 tablet; Refill: 0  Hypertension, essential  Prediabetes  Suspected sleep apnea     Assessment and Plan    Obesity Obesity with recent weight gain of 4 pounds, likely due to stress eating and increased carbohydrate intake. She reports constant hunger and cravings for sweets. Discussed phentermine for appetite suppression, highlighting its stimulating effects and potential side effects, including increased heart rate, blood pressure, constipation, dry mouth, and anxiety. Emphasized dietary changes alongside medication for effective weight  management. Phentermine, a controlled substance, has a lower risk of dependence compared to Ritalin or Adderall. Discussed potential for significant weight loss with phentermine as part of a comprehensive weight management plan, including dietary changes and exercise. - Prescribe phentermine, starting with a low dose, to be taken as half a tablet on an empty stomach or 1-2 hours after breakfast. - Advise on dietary changes, including reducing carbohydrate intake and aiming for 30-40 grams of protein per meal. - Suggest using AI to generate a 7-day high-protein, 1700-calorie meal plan. - Recommend meal replacements to reduce calorie intake. - Encourage creating a home environment that minimizes access to triggering foods.  Hypertension Hypertension with current blood pressure of 138/84 mmHg. Discussed the impact of phentermine on blood pressure and the need for monitoring, as it can increase heart rate and blood pressure, similar to strong coffee. - Monitor blood pressure regularly, especially after starting phentermine.  Possibility of Sleep Apnea Possibility of sleep apnea with symptoms suggestive of the condition. Awaiting insurance confirmation for a sleep study, necessary for further management and potential treatment with Zioptan if confirmed. Sleep apnea diagnosis is also necessary for insurance coverage of certain weight loss treatments. - Await insurance confirmation for sleep study scheduling. - Consider Zioptan if sleep apnea is confirmed.         Objective   Physical Exam:  Blood pressure 138/84, pulse 73, temperature 98.2 F (36.8 C), height 5\' 7"  (1.702 m), weight (!) 370 lb (167.8 kg), SpO2 100%. Body mass index is 57.95 kg/m.  General: She is overweight, cooperative, alert, well developed, and in no acute distress. PSYCH:  Has normal mood, affect and thought process.   HEENT: EOMI, sclerae are anicteric. Lungs: Normal breathing effort, no conversational  dyspnea. Extremities: No edema.  Neurologic: No gross sensory or motor deficits. No tremors or fasciculations noted.    Diagnostic Data Reviewed:  BMET    Component Value Date/Time   NA 139 10/24/2023 1031   K 4.3 10/24/2023 1031   CL 103 10/24/2023 1031   CO2 20 10/24/2023 1031   GLUCOSE 92 10/24/2023 1031   GLUCOSE 98 03/30/2023 1425   BUN 8 10/24/2023 1031   CREATININE 0.65 10/24/2023 1031   CREATININE 0.76 03/30/2023 1425   CALCIUM 9.0 10/24/2023 1031   GFRNONAA >60 03/17/2020 1424   GFRAA >60 03/17/2020 1424   Lab Results  Component Value Date   HGBA1C 5.9 (H) 10/24/2023   HGBA1C 6.0 07/24/2011   Lab Results  Component Value Date   INSULIN  14.1 10/24/2023   INSULIN  14.9 10/12/2020   Lab Results  Component Value Date   TSH 2.310 10/24/2023   CBC    Component Value Date/Time   WBC 5.9 10/24/2023 1031   WBC 8.2 02/17/2022 1004   RBC 5.18 10/24/2023 1031   RBC 5.13 (H) 02/17/2022 1004   HGB 12.6 10/24/2023 1031   HCT 38.9 10/24/2023 1031   PLT 275 10/24/2023 1031   MCV 75 (L) 10/24/2023 1031   MCH 24.3 (L) 10/24/2023 1031   MCH 23.8 (L) 03/17/2020 1424   MCHC 32.4 10/24/2023 1031   MCHC 32.4 02/17/2022 1004   RDW 15.7 (H) 10/24/2023 1031   Iron Studies No results found for: "IRON", "TIBC", "FERRITIN", "IRONPCTSAT" Lipid Panel     Component Value Date/Time   CHOL 171 10/24/2023 1031   TRIG 84 10/24/2023 1031   HDL 67 10/24/2023 1031   CHOLHDL 2 02/17/2022 1004   VLDL 10.4 02/17/2022 1004   LDLCALC 88 10/24/2023 1031   LDLCALC 61 12/21/2017 1509   Hepatic Function Panel     Component Value Date/Time   PROT 6.8 10/24/2023 1031   ALBUMIN 4.1 10/24/2023 1031   AST 21 10/24/2023 1031   ALT 16 10/24/2023 1031   ALKPHOS 80 10/24/2023 1031   BILITOT 0.3 10/24/2023 1031      Component Value Date/Time   TSH 2.310 10/24/2023 1031   Nutritional Lab Results  Component Value Date   VD25OH 13.1 (L) 10/24/2023   VD25OH 23.8 (L) 08/28/2022   VD25OH  6.7 (L) 04/10/2022    Medications: Outpatient Encounter Medications as of 12/12/2023  Medication Sig   ferrous sulfate  325 (65 FE) MG tablet Take 1 tablet (325 mg total) by mouth daily with breakfast.   phentermine (ADIPEX-P) 37.5 MG tablet Take 0.5 tablets (18.75 mg total) by mouth daily before breakfast.   Vitamin D , Ergocalciferol , (DRISDOL ) 1.25 MG (50000 UNIT) CAPS capsule Take 1 capsule (50,000 Units total) by mouth every 7 (seven) days.   [DISCONTINUED] tirzepatide  ([name] ) 2.5 MG/0.5ML Pen Inject 2.5 mg into the skin once a week. (Patient not taking: Reported on 12/12/2023)   No facility-administered encounter medications on file as of 12/12/2023.     Follow-Up   Return for rescheduled for 2-3 weeks.Aaron Aas She was informed of the importance of frequent follow up visits to maximize her success with intensive lifestyle modifications for her multiple health conditions.  Attestation Statement   Reviewed by clinician on day of visit: allergies, medications, problem list, medical history, surgical history, family history, social history, and previous encounter notes.     Ladd Picker, MD

## 2023-12-13 DIAGNOSIS — E669 Obesity, unspecified: Secondary | ICD-10-CM | POA: Insufficient documentation

## 2023-12-14 ENCOUNTER — Ambulatory Visit: Admitting: Neurology

## 2023-12-14 ENCOUNTER — Encounter: Admitting: Family Medicine

## 2023-12-14 DIAGNOSIS — R0681 Apnea, not elsewhere classified: Secondary | ICD-10-CM

## 2023-12-14 DIAGNOSIS — Z9189 Other specified personal risk factors, not elsewhere classified: Secondary | ICD-10-CM

## 2023-12-14 DIAGNOSIS — R0683 Snoring: Secondary | ICD-10-CM

## 2023-12-14 DIAGNOSIS — Z820 Family history of epilepsy and other diseases of the nervous system: Secondary | ICD-10-CM

## 2023-12-14 DIAGNOSIS — G4710 Hypersomnia, unspecified: Secondary | ICD-10-CM

## 2023-12-14 DIAGNOSIS — G4719 Other hypersomnia: Secondary | ICD-10-CM

## 2023-12-14 DIAGNOSIS — R519 Headache, unspecified: Secondary | ICD-10-CM

## 2023-12-14 DIAGNOSIS — R351 Nocturia: Secondary | ICD-10-CM

## 2023-12-18 ENCOUNTER — Inpatient Hospital Stay (HOSPITAL_BASED_OUTPATIENT_CLINIC_OR_DEPARTMENT_OTHER): Admission: RE | Admit: 2023-12-18 | Source: Ambulatory Visit | Admitting: Radiology

## 2023-12-18 DIAGNOSIS — Z1231 Encounter for screening mammogram for malignant neoplasm of breast: Secondary | ICD-10-CM

## 2023-12-24 ENCOUNTER — Telehealth

## 2023-12-24 DIAGNOSIS — B029 Zoster without complications: Secondary | ICD-10-CM | POA: Diagnosis not present

## 2023-12-25 MED ORDER — VALACYCLOVIR HCL 1 G PO TABS: 1000.0000 mg | ORAL_TABLET | Freq: Three times a day (TID) | ORAL | 0 refills | Status: AC

## 2023-12-25 NOTE — Progress Notes (Signed)
 I have spent 5 minutes in review of e-visit questionnaire, review and updating patient chart, medical decision making and response to patient.   Piedad Climes, PA-C

## 2023-12-25 NOTE — Progress Notes (Signed)
E-visit for Shingles   We are sorry that you are not feeling well. Here is how we plan to help!  Based on what you shared with me it looks like you have shingles.  Shingles or herpes zoster, is a common infection of the nerves.  It is a painful rash caused by the herpes zoster virus.  This is the same virus that causes chickenpox.  After a person has chickenpox, the virus remains inactive in the nerve cells.  Years later, the virus can become active again and travel to the skin.  It typically will appear on one side of the face or body.  Burning or shooting pain, tingling, or itching are early signs of the infection.  Blisters typically scab over in 7 to 10 days and clear up within 2-4 weeks. Shingles is only contagious to people that have never had the chickenpox, the chickenpox vaccine, or anyone who has a compromised immune system.  You should avoid contact with these type of people until your blisters scab over.  I have prescribed Valacyclovir 1g three times daily for 7 days   HOME CARE: Apply ice packs (wrapped in a thin towel), cool compresses, or soak in cool bath to help reduce pain. Use calamine lotion to calm itchy skin. Avoid scratching the rash. Avoid direct sunlight.  GET HELP RIGHT AWAY IF: Symptoms that don't away after treatment. A rash or blisters near your eye. Increased drainage, fever, or rash after treatment. Severe pain that doesn't go away.   MAKE SURE YOU   Understand these instructions. Will watch your condition. Will get help right away if you are not doing well or get worse.  Thank you for choosing an e-visit.  Your e-visit answers were reviewed by a board certified advanced clinical practitioner to complete your personal care plan. Depending upon the condition, your plan could have included both over the counter or prescription medications.  Please review your pharmacy choice. Make sure the pharmacy is open so you can pick up prescription now. If there is  a problem, you may contact your provider through MyChart messaging and have the prescription routed to another pharmacy.  Your safety is important to us. If you have drug allergies check your prescription carefully.   For the next 24 hours you can use MyChart to ask questions about today's visit, request a non-urgent call back, or ask for a work or school excuse. You will get an email in the next two days asking about your experience. I hope that your e-visit has been valuable and will speed your recovery.  

## 2023-12-27 ENCOUNTER — Telehealth: Admitting: Physician Assistant

## 2023-12-27 DIAGNOSIS — Z889 Allergy status to unspecified drugs, medicaments and biological substances status: Secondary | ICD-10-CM

## 2023-12-27 DIAGNOSIS — L282 Other prurigo: Secondary | ICD-10-CM

## 2023-12-28 MED ORDER — PREDNISONE 10 MG PO TABS: ORAL_TABLET | ORAL | 0 refills | Status: DC

## 2023-12-28 NOTE — Progress Notes (Signed)
 E Visit for Rash  We are sorry that you are not feeling well. Here is how we plan to help!  Based on the information you have shared with me I do think you are having an allergic reaction to a medication.   I recommend you take Benadryl 25 mg - 50 mg every 4 hours to control the symptoms but if they last over 24 hours it is best that you see an office based provider for follow up. You can also take a 24- hr non-drowsy antihistamine like Claritin, Zyrtec , or Allegra.   You can add in Famotidine (Pepcid) for itching if needed as well. This is a medication known for heartburn, but its mechanism of action is a histamine-2 receptor blocker.  I am prescribing a two week course of steroids (37 tablets of 10 mg prednisone ).  Days 1-4 take 4 tablets (40 mg) daily  Days 5-8 take 3 tablets (30 mg) daily, Days 9-11 take 2 tablets (20 mg) daily, Days 12-14 take 1 tablet (10 mg) daily.   Stop the Valacyclovir  in case this medication has caused it since it was just started 3 days ago.  HOME CARE:  Take cool showers and avoid direct sunlight. Apply cool compress or wet dressings. Take a bath in an oatmeal bath.  Sprinkle content of one Aveeno packet under running faucet with comfortably warm water.  Bathe for 15-20 minutes, 1-2 times daily.  Pat dry with a towel. Do not rub the rash. Use hydrocortisone cream. Take an antihistamine like Benadryl for widespread rashes that itch.  The adult dose of Benadryl is 25-50 mg by mouth 4 times daily. Caution:  This type of medication may cause sleepiness.  Do not drink alcohol, drive, or operate dangerous machinery while taking antihistamines.  Do not take these medications if you have prostate enlargement.  Read package instructions thoroughly on all medications that you take.  GET HELP RIGHT AWAY IF:  Symptoms don't go away after treatment. Severe itching that persists. If you rash spreads or swells. If you rash begins to smell. If it blisters and opens or  develops a yellow-brown crust. You develop a fever. You have a sore throat. You become short of breath.  MAKE SURE YOU:  Understand these instructions. Will watch your condition. Will get help right away if you are not doing well or get worse.  Thank you for choosing an e-visit.  Your e-visit answers were reviewed by a board certified advanced clinical practitioner to complete your personal care plan. Depending upon the condition, your plan could have included both over the counter or prescription medications.  Please review your pharmacy choice. Make sure the pharmacy is open so you can pick up prescription now. If there is a problem, you may contact your provider through [employer] and have the prescription routed to another pharmacy.  Your safety is important to us . If you have drug allergies check your prescription carefully.   For the next 24 hours you can use MyChart to ask questions about today's visit, request a non-urgent call back, or ask for a work or school excuse. You will get an email in the next two days asking about your experience. I hope that your e-visit has been valuable and will speed your recovery.   I have spent 5 minutes in review of e-visit questionnaire, review and updating patient chart, medical decision making and response to patient.   Angelia Kelp, PA-C

## 2024-01-01 ENCOUNTER — Encounter: Payer: Self-pay | Admitting: Neurology

## 2024-01-01 NOTE — Progress Notes (Unsigned)
 Lindsey Figueroa

## 2024-01-04 ENCOUNTER — Ambulatory Visit: Payer: Self-pay | Admitting: Neurology

## 2024-01-04 NOTE — Procedures (Signed)
 [facility]  HOME SLEEP TEST (SANSA) REPORT (Mail-Out Device):   STUDY DATE: 12/19/2023  DOB: May 03, 1986  MRN: [id number]  ORDERING CLINICIAN: Debbra Fairy, MD, PhD   REFERRING CLINICIAN: Dr. Allie Area  CLINICAL INFORMATION/HISTORY: 38 year old female with an underlying complex medical history of insulin  resistance, prediabetes, hypertension, hyperlipidemia, palpitations, vitamin D  deficiency, iron deficiency, anxiety, depression, edema, back pain/sciatica, reflux disease, joint pain, and morbid obesity with a BMI of over 50, who reports snoring and excessive daytime somnolence as well as witnessed apneas and waking up with a sense of gasping for air.   PATIENT'S LAST REPORTED EPWORTH SLEEPINESS SCORE (ESS): 10/24.  BMI (at the time of sleep clinic visit and/or test date): 58.7 kg/m  FINDINGS:   Study Protocol:    The SANSA single-point-of-skin-contact chest-worn sensor - an FDA cleared and DOT approved type 4 home sleep test device - measures eight physiological channels,  including blood oxygen saturation (measured via PPG [photoplethysmography]), EKG-derived heart rate, respiratory effort, chest movement (measured via accelerometer), snoring, body position, and actigraphy. The device is designed to be worn for up to 10 hours per study.   Sleep Summary:   Total Recording Time (hours, min): 9 hours, 58 min  Total Effective Sleep Time (hours, min):  8 hours, 3 min  Sleep Efficiency (%):    81%   Respiratory Indices:   Calculated sAHI (per hour):  1.4/hour         Oxygen Saturation Statistics:    Oxygen Saturation (%) Mean: 97.3%   Minimum oxygen saturation (%):                 83.9%   O2 Saturation Range (%): 83.9-100%   Time below or at 88% saturation: 0 min   Pulse Rate Statistics:   Pulse Mean (bpm):    76/min    Pulse Range (62-117/min)   Snoring: Intermittent, mild to moderate  IMPRESSION/DIAGNOSES:   Primary  snoring  RECOMMENDATIONS:   This home sleep test does not demonstrate any significant obstructive sleep disordered breathing with a total AHI of less than 5/hour. Her total AHI was 1.4/hour, O2 nadir of 83.9% without any significant time below 89% saturation of less than 1 minute for the night.  Snoring was detected, intermittently in the mild to moderate range. Treatment with a positive airway pressure device such as AutoPap or CPAP is not indicated based on this test. Snoring may improve with avoidance of the supine sleep position and weight loss (where clinically appropriate).   For disturbing snoring, an oral appliance through dentistry or orthodontics can be considered.  Other causes of the patient's symptoms, including circadian rhythm disturbances, an underlying mood disorder, medication effect and/or an underlying medical problem cannot be ruled out based on this test. Clinical correlation is recommended.  The patient should be cautioned not to drive, work at heights, or operate dangerous or heavy equipment when tired or sleepy. Review and reiteration of good sleep hygiene measures should be pursued with any patient. The patient will be advised to follow up with her referring provider, who will be notified of the test results.   I certify that I have reviewed the raw data recording prior to the issuance of this report in accordance with the standards of the American Academy of Sleep Medicine (AASM).    INTERPRETING PHYSICIAN:   Debbra Fairy, MD, PhD Medical Director, [facility]) Diplomat, ABPN (Neurology and Sleep)   [facility] [address]  [place], [place] [address] [phone]

## 2024-01-07 ENCOUNTER — Encounter (INDEPENDENT_AMBULATORY_CARE_PROVIDER_SITE_OTHER): Payer: Self-pay | Admitting: Internal Medicine

## 2024-01-07 ENCOUNTER — Ambulatory Visit (INDEPENDENT_AMBULATORY_CARE_PROVIDER_SITE_OTHER): Admitting: Internal Medicine

## 2024-01-07 VITALS — BP 136/84 | HR 75 | Temp 98.4°F | Ht 67.0 in | Wt 365.0 lb

## 2024-01-07 DIAGNOSIS — E559 Vitamin D deficiency, unspecified: Secondary | ICD-10-CM | POA: Diagnosis not present

## 2024-01-07 DIAGNOSIS — R7303 Prediabetes: Secondary | ICD-10-CM | POA: Diagnosis not present

## 2024-01-07 DIAGNOSIS — E66813 Obesity, class 3: Secondary | ICD-10-CM | POA: Diagnosis not present

## 2024-01-07 DIAGNOSIS — I10 Essential (primary) hypertension: Secondary | ICD-10-CM | POA: Diagnosis not present

## 2024-01-07 DIAGNOSIS — Z6841 Body Mass Index (BMI) 40.0 and over, adult: Secondary | ICD-10-CM

## 2024-01-07 MED ORDER — VITAMIN D (ERGOCALCIFEROL) 1.25 MG (50000 UNIT) PO CAPS: 50000.0000 [IU] | ORAL_CAPSULE | ORAL | 0 refills | Status: DC

## 2024-01-07 MED ORDER — PHENTERMINE HCL 37.5 MG PO TABS: 18.7500 mg | ORAL_TABLET | Freq: Every day | ORAL | 0 refills | Status: DC

## 2024-01-07 NOTE — Assessment & Plan Note (Signed)
 Patient is returning to care she apparently discontinue use of amlodipine .  Her blood pressure today slightly above target.  She is not interested in restarting medication.  She is on low-dose phentermine  as her insurance does not cover GLP-1 but we need to closely monitor her blood pressure.  She may need to start antihypertensives to be able to tolerate sympathomimetic.

## 2024-01-07 NOTE — Assessment & Plan Note (Signed)
 Has lost 5 pounds since starting phentermine .  No side effects reported other than dry mouth. Continue current prescriptive nutritional plan targeting 1800 cal Continue to work on increasing volume of physical activity at present time inadequate. Continue phentermine  at 18.75 mg once a day with blood pressure monitoring.  Do not recommend any further increases. Considering degree of obesity and comorbidities we discussed the role of metabolic surgery.  She does not have access to GLP-1 and she does not like to take medications long-term so may likely benefit from metabolic surgery. Continue current weight management strategy

## 2024-01-07 NOTE — Progress Notes (Signed)
 Office: [phone]  /  Fax: [phone]  Weight Summary and Body Composition Analysis (BIA)  Vitals Temp: 98.4 F (36.9 C) BP: 136/84 Pulse Rate: 75 SpO2: 98 %   Anthropometric Measurements Height: 5' 7 (1.702 m) Weight: (!) 365 lb (165.6 kg) BMI (Calculated): 57.15 Weight at Last Visit: 370 lb Weight Lost Since Last Visit: 5 lb Weight Gained Since Last Visit: 0 lb Starting Weight: 368 lb Total Weight Loss (lbs): 3 lb (1.361 kg) Peak Weight: 391 lb   Body Composition  Body Fat %: 56.1 % Fat Mass (lbs): 205.2 lbs Muscle Mass (lbs): 152.6 lbs Total Body Water (lbs): 115.8 lbs Visceral Fat Rating : 22    RMR: [id number]  Today's Visit #: 4  Starting Date: 10/24/23   Subjective   Chief Complaint: Obesity  Interval History Discussed the use of AI scribe software for clinical note transcription with the patient, who gave verbal consent to proceed.  History of Present Illness   Lindsey Figueroa is a 38 year old female with hypertension, insulin  resistance, and prediabetes who presents for medical weight management.  She is adhering to an 1800 calorie nutrition plan approximately 50-60% of the time, tracking her intake, and incorporating more whole foods. She exercises twice a week for about 20 minutes, focusing on strength and cardio. Since her last visit, she has lost five pounds.  She was started on phentermine  18.75 mg once daily about a month ago, which has decreased her hunger. She does not eat as early in the morning and sometimes forgets to eat, necessitating scheduled meals. She experiences dry mouth as a side effect but manages it by increasing water intake. She is concerned about inadequate protein intake due to reduced appetite and supplements with protein shakes and smoothies.  She reports inadequate sleep and high stress levels. A recent sleep study indicated significant snoring. She was recently prescribed prednisone  for a shingles outbreak, which she  suspects may contribute to her insomnia. The shingles rash was located along her stomach and down her arm, treated with Valtrex  and prednisone .  Her blood pressure remains steady in the 130s, and she has not experienced anxiety issues. She is on high-dose vitamin D , prescribed for four months, but has encountered pharmacy refill issues.  She recently received a promotion at work, impacting her ability to exercise regularly due to time constraints. She is working on coordinating her schedule to incorporate more physical activity.       Challenges affecting patient progress: low volume of physical activity at present .    Pharmacotherapy for weight management: She is currently taking Phentermine  (longterm use, single agent)  with adequate clinical response  and without side effects..   Assessment and Plan   Treatment Plan For Obesity:  Recommended Dietary Goals  Lindsey Figueroa is currently in the action stage of change. As such, her goal is to continue weight management plan. She has agreed to: follow the Category 4 plan - 1800 kcal per day  Behavioral Health and Counseling  We discussed the following behavioral modification strategies today: continue to work on maintaining a reduced calorie state, getting the recommended amount of protein, incorporating whole foods, making healthy choices, staying well hydrated and practicing mindfulness when eating..  Additional education and resources provided today: None  Recommended Physical Activity Goals  Lindsey Figueroa has been advised to work up to 150 minutes of moderate intensity aerobic activity a week and strengthening exercises 2-3 times per week for cardiovascular health, weight loss maintenance and preservation of  muscle mass.   She has agreed to :  continue to gradually increase the amount and intensity of exercise routine  Medical Interventions and Pharmacotherapy  We discussed various medication options to help Lindsey Figueroa with her weight loss  efforts and we both agreed to : Adequate clinical response to anti-obesity medication, continue current regimen and do not recommend further increases due to slight elevation in blood pressure at baseline  Associated Conditions Impacted by Obesity Treatment  Hypertension, essential Assessment & Plan: Patient is returning to care she apparently discontinue use of amlodipine .  Her blood pressure today slightly above target.  She is not interested in restarting medication.  She is on low-dose phentermine  as her insurance does not cover GLP-1 but we need to closely monitor her blood pressure.  She may need to start antihypertensives to be able to tolerate sympathomimetic.   Vitamin D  deficiency Assessment & Plan: Most recent vitamin D  levels  Lab Results  Component Value Date   VD25OH 13.1 (L) 10/24/2023   VD25OH 23.8 (L) 08/28/2022   VD25OH 6.7 (L) 04/10/2022     Deficiency state associated with adiposity and may result in leptin resistance, weight gain and fatigue.  On high-dose vitamin D  supplementation without adverse effects  Plan: Complete 4 months recheck levels in August/September   Orders: -     Vitamin D  (Ergocalciferol ); Take 1 capsule (50,000 Units total) by mouth every 7 (seven) days.  Dispense: 16 capsule; Refill: 0  Prediabetes Assessment & Plan: Most recent A1c is  Lab Results  Component Value Date   HGBA1C 5.9 (H) 10/24/2023   HGBA1C 6.0 07/24/2011    Patient aware of disease state and risk of progression. This may contribute to abnormal cravings, fatigue and diabetic complications without having diabetes.   We have discussed treatment options which include: losing 7 to 10% of body weight, increasing physical activity to a goal of 150 minutes a week at moderate intensity.  Advised to maintain a diet low on simple and processed carbohydrates.  She had been on metformin  in the past but does not like to take medications.  Reports not being good at taking  medications daily.  Continue with current weight management strategy consider adding metformin  down the line.  We also discussed the role of metabolic surgery.    Class 3 severe obesity with serious comorbidity and body mass index (BMI) of 50.0 to 59.9 in adult Assessment & Plan: Has lost 5 pounds since starting phentermine .  No side effects reported other than dry mouth. Continue current prescriptive nutritional plan targeting 1800 cal Continue to work on increasing volume of physical activity at present time inadequate. Continue phentermine  at 18.75 mg once a day with blood pressure monitoring.  Do not recommend any further increases. Considering degree of obesity and comorbidities we discussed the role of metabolic surgery.  She does not have access to GLP-1 and she does not like to take medications long-term so may likely benefit from metabolic surgery. Continue current weight management strategy  Orders: -     Phentermine  HCl; Take 0.5 tablets (18.75 mg total) by mouth daily before breakfast.  Dispense: 15 tablet; Refill: 0       Objective   Physical Exam:  Blood pressure 136/84, pulse 75, temperature 98.4 F (36.9 C), height 5' 7 (1.702 m), weight (!) 365 lb (165.6 kg), last menstrual period 12/31/2023, SpO2 98%. Body mass index is 57.17 kg/m.  General: She is overweight, cooperative, alert, well developed, and in no acute distress. PSYCH:  Has normal mood, affect and thought process.   HEENT: EOMI, sclerae are anicteric. Lungs: Normal breathing effort, no conversational dyspnea. Extremities: No edema.  Neurologic: No gross sensory or motor deficits. No tremors or fasciculations noted.    Diagnostic Data Reviewed:  BMET    Component Value Date/Time   NA 139 10/24/2023 1031   K 4.3 10/24/2023 1031   CL 103 10/24/2023 1031   CO2 20 10/24/2023 1031   GLUCOSE 92 10/24/2023 1031   GLUCOSE 98 03/30/2023 1425   BUN 8 10/24/2023 1031   CREATININE 0.65 10/24/2023 1031    CREATININE 0.76 03/30/2023 1425   CALCIUM 9.0 10/24/2023 1031   GFRNONAA >60 03/17/2020 1424   GFRAA >60 03/17/2020 1424   Lab Results  Component Value Date   HGBA1C 5.9 (H) 10/24/2023   HGBA1C 6.0 07/24/2011   Lab Results  Component Value Date   INSULIN  14.1 10/24/2023   INSULIN  14.9 10/12/2020   Lab Results  Component Value Date   TSH 2.310 10/24/2023   CBC    Component Value Date/Time   WBC 5.9 10/24/2023 1031   WBC 8.2 02/17/2022 1004   RBC 5.18 10/24/2023 1031   RBC 5.13 (H) 02/17/2022 1004   HGB 12.6 10/24/2023 1031   HCT 38.9 10/24/2023 1031   PLT 275 10/24/2023 1031   MCV 75 (L) 10/24/2023 1031   MCH 24.3 (L) 10/24/2023 1031   MCH 23.8 (L) 03/17/2020 1424   MCHC 32.4 10/24/2023 1031   MCHC 32.4 02/17/2022 1004   RDW 15.7 (H) 10/24/2023 1031   Iron Studies No results found for: IRON, TIBC, FERRITIN, IRONPCTSAT Lipid Panel     Component Value Date/Time   CHOL 171 10/24/2023 1031   TRIG 84 10/24/2023 1031   HDL 67 10/24/2023 1031   CHOLHDL 2 02/17/2022 1004   VLDL 10.4 02/17/2022 1004   LDLCALC 88 10/24/2023 1031   LDLCALC 61 12/21/2017 1509   Hepatic Function Panel     Component Value Date/Time   PROT 6.8 10/24/2023 1031   ALBUMIN 4.1 10/24/2023 1031   AST 21 10/24/2023 1031   ALT 16 10/24/2023 1031   ALKPHOS 80 10/24/2023 1031   BILITOT 0.3 10/24/2023 1031      Component Value Date/Time   TSH 2.310 10/24/2023 1031   Nutritional Lab Results  Component Value Date   VD25OH 13.1 (L) 10/24/2023   VD25OH 23.8 (L) 08/28/2022   VD25OH 6.7 (L) 04/10/2022    Medications: Outpatient Encounter Medications as of 01/07/2024  Medication Sig   ferrous sulfate  325 (65 FE) MG tablet Take 1 tablet (325 mg total) by mouth daily with breakfast.   predniSONE  (DELTASONE ) 10 MG tablet Days 1-4 take 4 tablets (40 mg) daily  Days 5-8 take 3 tablets (30 mg) daily, Days 9-11 take 2 tablets (20 mg) daily, Days 12-14 take 1 tablet (10 mg) daily.    [DISCONTINUED] phentermine  (ADIPEX-P ) 37.5 MG tablet Take 0.5 tablets (18.75 mg total) by mouth daily before breakfast.   [DISCONTINUED] Vitamin D , Ergocalciferol , (DRISDOL ) 1.25 MG (50000 UNIT) CAPS capsule Take 1 capsule (50,000 Units total) by mouth every 7 (seven) days.   phentermine  (ADIPEX-P ) 37.5 MG tablet Take 0.5 tablets (18.75 mg total) by mouth daily before breakfast.   Vitamin D , Ergocalciferol , (DRISDOL ) 1.25 MG (50000 UNIT) CAPS capsule Take 1 capsule (50,000 Units total) by mouth every 7 (seven) days.   No facility-administered encounter medications on file as of 01/07/2024.     Follow-Up   Return in about 4  weeks (around 02/04/2024) for For Weight Mangement with Dr. Francyne.SABRA She was informed of the importance of frequent follow up visits to maximize her success with intensive lifestyle modifications for her multiple health conditions.  Attestation Statement   Reviewed by clinician on day of visit: allergies, medications, problem list, medical history, surgical history, family history, social history, and previous encounter notes.     Lucas Francyne, MD

## 2024-01-07 NOTE — Assessment & Plan Note (Signed)
 Most recent A1c is  Lab Results  Component Value Date   HGBA1C 5.9 (H) 10/24/2023   HGBA1C 6.0 07/24/2011    Patient aware of disease state and risk of progression. This may contribute to abnormal cravings, fatigue and diabetic complications without having diabetes.   We have discussed treatment options which include: losing 7 to 10% of body weight, increasing physical activity to a goal of 150 minutes a week at moderate intensity.  Advised to maintain a diet low on simple and processed carbohydrates.  She had been on metformin  in the past but does not like to take medications.  Reports not being good at taking medications daily.  Continue with current weight management strategy consider adding metformin  down the line.  We also discussed the role of metabolic surgery.

## 2024-01-07 NOTE — Assessment & Plan Note (Signed)
 Most recent vitamin D  levels  Lab Results  Component Value Date   VD25OH 13.1 (L) 10/24/2023   VD25OH 23.8 (L) 08/28/2022   VD25OH 6.7 (L) 04/10/2022     Deficiency state associated with adiposity and may result in leptin resistance, weight gain and fatigue.  On high-dose vitamin D  supplementation without adverse effects  Plan: Complete 4 months recheck levels in August/September

## 2024-01-08 NOTE — Telephone Encounter (Signed)
 Noted

## 2024-01-10 ENCOUNTER — Telehealth: Admitting: Physician Assistant

## 2024-01-10 DIAGNOSIS — R21 Rash and other nonspecific skin eruption: Secondary | ICD-10-CM

## 2024-01-10 MED ORDER — CETIRIZINE HCL 10 MG PO TABS: 10.0000 mg | ORAL_TABLET | Freq: Every day | ORAL | 0 refills | Status: DC

## 2024-01-10 MED ORDER — FAMOTIDINE 20 MG PO TABS: 20.0000 mg | ORAL_TABLET | Freq: Two times a day (BID) | ORAL | 0 refills | Status: DC

## 2024-01-10 NOTE — Progress Notes (Signed)
 Virtual Visit Consent   Lindsey Figueroa, you are scheduled for a virtual visit with a [facility] provider today. Just as with appointments in the office, your consent must be obtained to participate. Your consent will be active for this visit and any virtual visit you may have with one of our providers in the next 365 days. If you have a MyChart account, a copy of this consent can be sent to you electronically.  As this is a virtual visit, video technology does not allow for your provider to perform a traditional examination. This may limit your provider's ability to fully assess your condition. If your provider identifies any concerns that need to be evaluated in person or the need to arrange testing (such as labs, EKG, etc.), we will make arrangements to do so. Although advances in technology are sophisticated, we cannot ensure that it will always work on either your end or our end. If the connection with a video visit is poor, the visit may have to be switched to a telephone visit. With either a video or telephone visit, we are not always able to ensure that we have a secure connection.  By engaging in this virtual visit, you consent to the provision of healthcare and authorize for your insurance to be billed (if applicable) for the services provided during this visit. Depending on your insurance coverage, you may receive a charge related to this service.  I need to obtain your verbal consent now. Are you willing to proceed with your visit today? Lindsey Figueroa has provided verbal consent on 01/10/2024 for a virtual visit (video or telephone). [name], PA-C  Date: 01/10/2024 8:03 PM   Virtual Visit via Video Note   I, [name], connected with  Lindsey Figueroa  ([id number], 1986-02-01) on 01/10/24 at  7:15 PM EDT by a video-enabled telemedicine application and verified that I am speaking with the correct person using two identifiers.  Location: Patient: Virtual Visit  Location Patient: Home Provider: Virtual Visit Location Provider: Home Office   I discussed the limitations of evaluation and management by telemedicine and the availability of in person appointments. The patient expressed understanding and agreed to proceed.    History of Present Illness: Lindsey Figueroa is a 38 y.o. who identifies as a female who was assigned female at birth, and is being seen today for rash.  HPI: Rash This is a recurrent problem. The current episode started today (started back around 3am this morning). The problem has been gradually worsening since onset. The affected locations include the neck. The rash is characterized by redness and itchiness. It is unknown if there was an exposure to a precipitant. Pertinent negatives include no congestion, cough, facial edema, fatigue, fever or shortness of breath. Past treatments include oral steroids. The treatment provided no relief.    Had a classic shingles rash on her abdomen and was given valacyclovir  on 12/24/23 (e-visit). Then developed an all over body rash and thought was having an allergic reaction to valacyclovir . Completed an e-visit on 12/27/23 and was told to stop valacyclovir  and was given prednisone  x 2 week taper. Rash cleared, but finished prednisone  yesterday and rash returned around 3am this morning and has been progressing.  Problems:  Patient Active Problem List   Diagnosis Date Noted   Iron deficiency 11/07/2023   Hypertension, essential 05/08/2022   Suspected sleep apnea 05/08/2022   Vitamin D  deficiency 05/08/2022   Class 3 severe obesity without serious comorbidity with  body mass index (BMI) of 50.0 to 59.9 in adult 04/24/2022   Prediabetes 04/11/2022   Depression screening 04/10/2022   Insulin  resistance 12/07/2020   Chronic pain of both knees 09/06/2020   Low back pain with radiation 07/08/2020   Lower extremity edema 07/08/2020   Preventative health care 12/21/2017   Class 3 severe obesity with  serious comorbidity and body mass index (BMI) of 50.0 to 59.9 in adult 09/22/2013   Vaginal discharge 05/20/2012   UTI symptoms 05/20/2012   Contraception management 04/03/2012   HIP PAIN, LEFT 05/20/2010   Palpitation 05/20/2010    Allergies: No Known Allergies Medications:  Current Outpatient Medications:    cetirizine  (ZYRTEC ) 10 MG tablet, Take 1 tablet (10 mg total) by mouth daily., Disp: 30 tablet, Rfl: 0   famotidine (PEPCID) 20 MG tablet, Take 1 tablet (20 mg total) by mouth 2 (two) times daily., Disp: 60 tablet, Rfl: 0   ferrous sulfate  325 (65 FE) MG tablet, Take 1 tablet (325 mg total) by mouth daily with breakfast., Disp: 30 tablet, Rfl: 0   phentermine  (ADIPEX-P ) 37.5 MG tablet, Take 0.5 tablets (18.75 mg total) by mouth daily before breakfast., Disp: 15 tablet, Rfl: 0   predniSONE  (DELTASONE ) 10 MG tablet, Days 1-4 take 4 tablets (40 mg) daily  Days 5-8 take 3 tablets (30 mg) daily, Days 9-11 take 2 tablets (20 mg) daily, Days 12-14 take 1 tablet (10 mg) daily., Disp: 37 tablet, Rfl: 0   Vitamin D , Ergocalciferol , (DRISDOL ) 1.25 MG (50000 UNIT) CAPS capsule, Take 1 capsule (50,000 Units total) by mouth every 7 (seven) days., Disp: 16 capsule, Rfl: 0  Observations/Objective: Patient is well-developed, well-nourished in no acute distress.  Resting comfortably at home.  Head is normocephalic, atraumatic.  No labored breathing.  Speech is clear and coherent with logical content.  Patient is alert and oriented at baseline.  Erythematous, pruritic papules on anterior neck and right lateral neck  Assessment and Plan: 1. Rash and nonspecific skin eruption (Primary) - famotidine (PEPCID) 20 MG tablet; Take 1 tablet (20 mg total) by mouth 2 (two) times daily.  Dispense: 60 tablet; Refill: 0 - cetirizine  (ZYRTEC ) 10 MG tablet; Take 1 tablet (10 mg total) by mouth daily.  Dispense: 30 tablet; Refill: 0  - Now with rash recurring after 2 weeks of Prednisone  it is less likely to be an  allergy to Valacyclovir  as previously suspected - Potential Viral exanthem from shingles  - Also discussed potential allergy to another medication she is taken, unsure which, may require elimination trial to see if rash develops with the restart of any - Will give Famotidine and Zyrtec  as above for the current rash - Cold compresses - Luke warm to cool showers - May use topical hydrocortisone cream and/or topical benadryl cream - Seek in person evaluation for worsening symptoms or if rash does not resolve within 5-7 days  Follow Up Instructions: I discussed the assessment and treatment plan with the patient. The patient was provided an opportunity to ask questions and all were answered. The patient agreed with the plan and demonstrated an understanding of the instructions.  A copy of instructions were sent to the patient via MyChart unless otherwise noted below.    The patient was advised to call back or seek an in-person evaluation if the symptoms worsen or if the condition fails to improve as anticipated.    [name], PA-C

## 2024-01-10 NOTE — Patient Instructions (Signed)
 [name], thank you for joining [name], PA-C for today's virtual visit.  While this provider is not your primary care provider (PCP), if your PCP is located in our provider database this encounter information will be shared with them immediately following your visit.   A [facility] MyChart account gives you access to today's visit and all your visits, tests, and labs performed at [facility]  click here if you don't have a [facility] MyChart account or go to [email or web address]  Consent: (Patient) Lindsey Figueroa provided verbal consent for this virtual visit at the beginning of the encounter.  Current Medications:  Current Outpatient Medications:    cetirizine  (ZYRTEC ) 10 MG tablet, Take 1 tablet (10 mg total) by mouth daily., Disp: 30 tablet, Rfl: 0   famotidine (PEPCID) 20 MG tablet, Take 1 tablet (20 mg total) by mouth 2 (two) times daily., Disp: 60 tablet, Rfl: 0   ferrous sulfate  325 (65 FE) MG tablet, Take 1 tablet (325 mg total) by mouth daily with breakfast., Disp: 30 tablet, Rfl: 0   phentermine  (ADIPEX-P ) 37.5 MG tablet, Take 0.5 tablets (18.75 mg total) by mouth daily before breakfast., Disp: 15 tablet, Rfl: 0   predniSONE  (DELTASONE ) 10 MG tablet, Days 1-4 take 4 tablets (40 mg) daily  Days 5-8 take 3 tablets (30 mg) daily, Days 9-11 take 2 tablets (20 mg) daily, Days 12-14 take 1 tablet (10 mg) daily., Disp: 37 tablet, Rfl: 0   Vitamin D , Ergocalciferol , (DRISDOL ) 1.25 MG (50000 UNIT) CAPS capsule, Take 1 capsule (50,000 Units total) by mouth every 7 (seven) days., Disp: 16 capsule, Rfl: 0   Medications ordered in this encounter:  Meds ordered this encounter  Medications   famotidine (PEPCID) 20 MG tablet    Sig: Take 1 tablet (20 mg total) by mouth 2 (two) times daily.    Dispense:  60 tablet    Refill:  0    Supervising Provider:   LAMPTEY, PHILIP O [[id number]]   cetirizine  (ZYRTEC ) 10 MG tablet    Sig: Take 1 tablet (10 mg total)  by mouth daily.    Dispense:  30 tablet    Refill:  0    Supervising Provider:   LAMPTEY, PHILIP O [[id number]]     *If you need refills on other medications prior to your next appointment, please contact your pharmacy*  Follow-Up: Call back or seek an in-person evaluation if the symptoms worsen or if the condition fails to improve as anticipated.  [facility] [phone]  Other Instructions Rash, Adult A rash is a breakout of spots or blotches on the skin. It can affect the way the skin looks and feels. Many things can cause a rash. Common causes include: Viral infections. These include colds, measles, and hand, foot, and mouth disease. Bacterial infections. These include scarlet fever and impetigo. Fungal infections. These include athlete's foot, ringworm, and yeast rashes. Skin irritation. This may be from heat rash, exposure to moisture or friction for a long time (intertrigo), or exposure to soap or skin care products (eczema). Allergic reactions. These may be caused by foods, medicines, or things like poison ivy. Some rashes may go away after a few days. Others may last for a few weeks. The goal of treatment is to stop the itching and keep the rash from spreading. Follow these instructions at home: Medicine Take or apply over-the-counter and prescription medicines only as told by your health care provider. These may include: Corticosteroids. These  can help treat red or swollen skin. They may be given as creams or as medicines to take by mouth (orally). Anti-itch lotions. Allergy medicines. Pain medicine. Antifungal medicine if the rash is from a fungal infection. Antibiotics if you have an infection.  Skin care Apply cool, wet cloths (compresses) to the affected areas. Do not scratch or rub your skin. Avoid covering the rash. Keep it exposed to air as often as you can. Managing itching and discomfort Avoid hot showers and baths. These can make itching worse. A  cold shower may help. Try taking a bath with: Epsom salts. You can get these at your local pharmacy or grocery store. Follow the instructions on the package. Baking soda. Pour a small amount into the bath as told by your provider. Colloidal oatmeal. You can get this at your local pharmacy or grocery store. Follow the instructions on the package. Try putting baking soda paste on your skin. Stir water into baking soda until it becomes like a paste. Try using calamine lotion or cortisone cream to help with itchiness. Keep cool. Stay out of the sun. Sweating and being hot can make itching worse. General instructions  Rest as needed. Drink enough fluid to keep your pee (urine) pale yellow. Wear loose-fitting clothes. Avoid scented soaps, detergents, and perfumes. Use gentle soaps, detergents, perfumes, and cosmetics. Avoid the things that cause your rash (triggers). Keep a journal to help keep track of your triggers. Write down: What you eat. What cosmetics you use. What you drink. What you wear. This includes jewelry. Contact a health care provider if: You sweat at night more than normal. You pee (urinate) more or less than normal, or your pee is a darker color than normal. Your eyes become sensitive to light. Your skin or the white parts of your eyes turn yellow (jaundice). Your skin tingles or is numb. You get painful blisters in your nose or mouth. Your rash does not go away after a few days, or it gets worse. You are more tired or thirsty than normal. You have new or worse symptoms. These may include: Pain in your abdomen. Fever. Diarrhea or vomiting. Weakness or weight loss. Get help right away if: You get confused. You have a severe headache, a stiff neck, or severe joint pain or stiffness. You become very sleepy or not responsive. You have a seizure. This information is not intended to replace advice given to you by your health care provider. Make sure you discuss any  questions you have with your health care provider. Document Revised: 04/21/2022 Document Reviewed: 04/21/2022 Elsevier Patient Education  2024 [employer]   If you have been instructed to have an in-person evaluation today at a local Urgent Care facility, please use the link below. It will take you to a list of all of our available [facility], including address, phone number and hours of operation. Please do not delay care.  [facility]  If you or a family member do not have a primary care provider, use the link below to schedule a visit and establish care. When you choose a [facility] primary care physician or advanced practice provider, you gain a long-term partner in health. Find a Primary Care Provider  Learn more about [facility]'s in-office and virtual care options:  - Get Care Now

## 2024-01-14 ENCOUNTER — Encounter (INDEPENDENT_AMBULATORY_CARE_PROVIDER_SITE_OTHER): Payer: Self-pay | Admitting: Internal Medicine

## 2024-01-16 ENCOUNTER — Telehealth: Admitting: Physician Assistant

## 2024-01-16 DIAGNOSIS — K047 Periapical abscess without sinus: Secondary | ICD-10-CM | POA: Diagnosis not present

## 2024-01-17 ENCOUNTER — Encounter: Payer: Self-pay | Admitting: Family Medicine

## 2024-01-17 ENCOUNTER — Ambulatory Visit: Admitting: Family Medicine

## 2024-01-17 VITALS — BP 126/84 | HR 87 | Temp 98.2°F | Resp 18 | Ht 67.0 in | Wt 368.4 lb

## 2024-01-17 DIAGNOSIS — F419 Anxiety disorder, unspecified: Secondary | ICD-10-CM

## 2024-01-17 DIAGNOSIS — R21 Rash and other nonspecific skin eruption: Secondary | ICD-10-CM

## 2024-01-17 MED ORDER — FLUOXETINE HCL 20 MG PO TABS: 20.0000 mg | ORAL_TABLET | Freq: Every day | ORAL | 3 refills | Status: DC

## 2024-01-17 MED ORDER — TRIAMCINOLONE ACETONIDE 0.1 % EX CREA: 1.0000 | TOPICAL_CREAM | Freq: Two times a day (BID) | CUTANEOUS | 0 refills | Status: DC

## 2024-01-17 NOTE — Progress Notes (Signed)
 Established Patient Office Visit  Subjective   Patient ID: Lindsey Figueroa, female    DOB: 07/16/1986  Age: 38 y.o. MRN: [id number]  Chief Complaint  Patient presents with   Rash    Left side of the stomach. Pt states doing e e-visit and was given valacyclovir . Rash spread to neck and face. Pt stopped the medication after another e-visit and was given prednisone . The rash continued.      HPI Discussed the use of AI scribe software for clinical note transcription with the patient, who gave verbal consent to proceed.  History of Present Illness Lindsey Figueroa is a 38 year old female who presents with a persistent itchy rash.  The patient has been experiencing a persistent itchy rash since mid-June. Initially, the rash appeared on her stomach as raised and burning, but has since resolved in that area. It then spread to her neck and right arm, characterized by little bumps, redness, and itching. She wonders if the rash could be related to sun exposure, noting that it appeared on her neck and arm, but also initially on her stomach, which was not exposed to the sun. No recent changes in soap, lotion, detergent, or jewelry have been made. She has never experienced allergies before, although her sister has multiple allergies. Ice packs provide temporary relief.  She was previously treated with prednisone  for two weeks and valacyclovir , initially thought to be for a shingles outbreak. However, the rash persisted despite these treatments, and she resumed valacyclovir  at a lower dose without improvement. Over-the-counter lotions like Gold Bond and chamomile lotion have been used without significant relief.  She reports high levels of stress related to work and home life, which her mother believes may contribute to her symptoms. She has never been on medication for anxiety before and experiences daily stress. She is concerned about potential weight gain from anxiety medications due to her current  use of phentermine , which suppresses her appetite.  Additionally, she describes a recurring issue with her ear, which began after a cold in November of the previous year. Her ear feels clogged, as if 'stuffed with clay,' and this sensation recurs every other month, sometimes leading to drainage and an abscess near her tooth. She has not yet seen a dentist for this issue. No use of Flonase  or antihistamines.   Patient Active Problem List   Diagnosis Date Noted   Iron deficiency 11/07/2023   Hypertension, essential 05/08/2022   Suspected sleep apnea 05/08/2022   Vitamin D  deficiency 05/08/2022   Class 3 severe obesity without serious comorbidity with body mass index (BMI) of 50.0 to 59.9 in adult 04/24/2022   Prediabetes 04/11/2022   Depression screening 04/10/2022   Insulin  resistance 12/07/2020   Chronic pain of both knees 09/06/2020   Low back pain with radiation 07/08/2020   Lower extremity edema 07/08/2020   Preventative health care 12/21/2017   Class 3 severe obesity with serious comorbidity and body mass index (BMI) of 50.0 to 59.9 in adult 09/22/2013   Vaginal discharge 05/20/2012   UTI symptoms 05/20/2012   Contraception management 04/03/2012   HIP PAIN, LEFT 05/20/2010   Palpitation 05/20/2010   Past Medical History:  Diagnosis Date   Anxiety    Back pain    Bilateral swelling of feet and ankles    Chewing difficulty    Depression    Edema    GERD (gastroesophageal reflux disease)    Hyperlipemia    Hypertension    Joint pain  Lactose intolerance    Obesity    Other fatigue    Palpitations    Prediabetes    Sciatica    Shortness of breath on exertion    SOB (shortness of breath)    STD (sexually transmitted disease)    Chlamydia   Swallowing difficulty    Vitamin D  deficiency    Past Surgical History:  Procedure Laterality Date   TONSILLECTOMY AND ADENOIDECTOMY     WISDOM TOOTH EXTRACTION  2018   Social History   Tobacco Use   Smoking status: Never    Smokeless tobacco: Never  Vaping Use   Vaping status: Never Used  Substance Use Topics   Alcohol use: No   Drug use: No   Social History   Socioeconomic History   Marital status: Single    Spouse name: Not on file   Number of children: Not on file   Years of education: Not on file   Highest education level: Not on file  Occupational History   Occupation: spa Event organiser: OTHER    Comment: [employer]  Tobacco Use   Smoking status: Never   Smokeless tobacco: Never  Vaping Use   Vaping status: Never Used  Substance and Sexual Activity   Alcohol use: No   Drug use: No   Sexual activity: Yes    Partners: Male    Birth control/protection: Condom    Comment: 1st intercourse 38 yo-More than 5 partners  Other Topics Concern   Not on file  Social History Narrative   Right handed   Lives with her mother, sister, and 54 yr old niece   Caffeine: 3 cups/day    Social Drivers of Corporate investment banker Strain: Not on file  Food Insecurity: Not on file  Transportation Needs: Not on file  Physical Activity: Not on file  Stress: Not on file  Social Connections: Not on file  Intimate Partner Violence: Not on file   Family Status  Relation Name Status   Mother  (Not Specified)   Father  (Not Specified)   MGM  (Not Specified)   MGF  (Not Specified)   Mat Uncle  Alive  No partnership data on file   Family History  Problem Relation Age of Onset   Diabetes Mother    Depression Mother    Sleep apnea Mother    High blood pressure Mother    Hypertension Father    High Cholesterol Father    Sleep apnea Maternal Grandmother    Hypertension Maternal Grandmother    Cancer - Lung Maternal Grandfather        Non smoker   Sleep apnea Maternal Uncle    No Known Allergies    Review of Systems  Constitutional:  Negative for fever and malaise/fatigue.  HENT:  Positive for ear discharge and ear pain. Negative for congestion and sinus pain.   Eyes:   Negative for blurred vision.  Respiratory:  Negative for cough, shortness of breath and stridor.   Cardiovascular:  Negative for chest pain, palpitations and leg swelling.  Gastrointestinal:  Negative for vomiting.  Musculoskeletal:  Negative for back pain.  Skin:  Positive for itching and rash.  Neurological:  Negative for loss of consciousness and headaches.  Psychiatric/Behavioral:  Negative for memory loss and suicidal ideas. The patient is nervous/anxious. The patient does not have insomnia.       Objective:     BP 126/84 (BP Location: Right Arm, Patient Position:  Sitting, Cuff Size: Large)   Pulse 87   Temp 98.2 F (36.8 C) (Oral)   Resp 18   Ht 5' 7 (1.702 m)   Wt (!) 368 lb 6.4 oz (167.1 kg)   LMP 12/31/2023   SpO2 99%   BMI 57.70 kg/m  BP Readings from Last 3 Encounters:  01/17/24 126/84  01/07/24 136/84  12/12/23 138/84   Wt Readings from Last 3 Encounters:  01/17/24 (!) 368 lb 6.4 oz (167.1 kg)  01/07/24 (!) 365 lb (165.6 kg)  12/12/23 (!) 370 lb (167.8 kg)   SpO2 Readings from Last 3 Encounters:  01/17/24 99%  01/07/24 98%  12/12/23 100%      Physical Exam Vitals and nursing note reviewed.  Constitutional:      General: She is not in acute distress.    Appearance: Normal appearance. She is well-developed.  HENT:     Head: Normocephalic and atraumatic.     Right Ear: Ear canal and external ear normal. A middle ear effusion is present.     Left Ear: Hearing, tympanic membrane, ear canal and external ear normal.  Eyes:     General: No scleral icterus.       Right eye: No discharge.        Left eye: No discharge.  Cardiovascular:     Rate and Rhythm: Normal rate and regular rhythm.     Heart sounds: No murmur heard. Pulmonary:     Effort: Pulmonary effort is normal. No respiratory distress.     Breath sounds: Normal breath sounds.  Musculoskeletal:        General: Normal range of motion.     Cervical back: Normal range of motion and neck supple.      Right lower leg: No edema.     Left lower leg: No edema.  Skin:    General: Skin is warm and dry.     Findings: Rash present.  Neurological:     Mental Status: She is alert and oriented to person, place, and time.  Psychiatric:        Mood and Affect: Mood is anxious.        Speech: Speech normal.        Behavior: Behavior normal.        Thought Content: Thought content normal.        Cognition and Memory: Cognition and memory normal.        Judgment: Judgment normal.      No results found for any visits on 01/17/24.  Last CBC Lab Results  Component Value Date   WBC 5.9 10/24/2023   HGB 12.6 10/24/2023   HCT 38.9 10/24/2023   MCV 75 (L) 10/24/2023   MCH 24.3 (L) 10/24/2023   RDW 15.7 (H) 10/24/2023   PLT 275 10/24/2023   Last metabolic panel Lab Results  Component Value Date   GLUCOSE 92 10/24/2023   NA 139 10/24/2023   K 4.3 10/24/2023   CL 103 10/24/2023   CO2 20 10/24/2023   BUN 8 10/24/2023   CREATININE 0.65 10/24/2023   EGFR 114 10/24/2023   CALCIUM 9.0 10/24/2023   PROT 6.8 10/24/2023   ALBUMIN 4.1 10/24/2023   LABGLOB 2.7 10/24/2023   AGRATIO 1.3 04/10/2022   BILITOT 0.3 10/24/2023   ALKPHOS 80 10/24/2023   AST 21 10/24/2023   ALT 16 10/24/2023   ANIONGAP 9 03/17/2020   Last lipids Lab Results  Component Value Date   CHOL 171 10/24/2023   HDL  67 10/24/2023   LDLCALC 88 10/24/2023   TRIG 84 10/24/2023   CHOLHDL 2 02/17/2022   Last hemoglobin A1c Lab Results  Component Value Date   HGBA1C 5.9 (H) 10/24/2023   Last thyroid  functions Lab Results  Component Value Date   TSH 2.310 10/24/2023   Last vitamin D  Lab Results  Component Value Date   VD25OH 13.1 (L) 10/24/2023   Last vitamin B12 and Folate Lab Results  Component Value Date   VITAMINB12 500 10/24/2023      The 10-year ASCVD risk score (Arnett DK, et al., 2019) is: 0.3%    Assessment & Plan:   Problem List Items Addressed This Visit   None Visit Diagnoses        Rash    -  Primary   Relevant Medications   triamcinolone cream (KENALOG) 0.1 %   Other Relevant Orders   Ambulatory referral to Dermatology     Rash and nonspecific skin eruption         Anxiety       Relevant Medications   FLUoxetine (PROZAC) 20 MG tablet     Assessment and Plan Assessment & Plan Allergic Reaction   She presents with an itchy rash on the abdomen, spreading to the neck and arm, characterized by raised, burning, red bumps. There are no known allergies or contact with new substances. The rash is localized to sun-exposed areas, although the abdomen was not exposed. She recently completed a prednisone  course and was misdiagnosed with shingles. The current presentation suggests an allergic reaction, possibly exacerbated by sun exposure. Considering her recent prednisone  use, a topical treatment is preferred. Prescribe prescription-strength hydrocortisone cream for topical application. Refer to dermatology for further evaluation, although the rash may resolve before the appointment.  Eustachian Tube Dysfunction   She experiences intermittent ear closure, described as feeling like being underwater, occurring every other month. This is associated with an abscess on the back of the tooth, possibly related to sinus issues. Ear examination shows wax but a normal eardrum. Symptoms suggest eustachian tube dysfunction, potentially related to allergies. Avoid decongestants due to potential blood pressure elevation from concurrent phentermine  use. Recommend over-the-counter Debrox drops for ear wax softening. Advise use of Flonase  and an antihistamine like Zyrtec  or Claritin for allergy management.  Anxiety   She reports high levels of stress related to work and home life, with daily symptoms of anxiety. She has not taken medication for anxiety before and is concerned about potential weight gain from anxiety medications due to current use of phentermine , which suppresses appetite. Lexapro was  initially considered, but due to concerns about weight gain, generic Prozac was chosen as it is less likely to cause weight gain and is easier to discontinue if needed. Prescribe generic Prozac for anxiety management. Advise follow-up in one month, which can be virtual if necessary. Instruct to report any issues with the medication for potential adjustment.    Return in about 4 weeks (around 02/14/2024), or if symptoms worsen or fail to improve, for f/u depression .    Alysson Geist R Lowne Chase, DO

## 2024-01-17 NOTE — Patient Instructions (Signed)
 Rash, Adult A rash is a breakout of spots or blotches on the skin. It can affect the way the skin looks and feels. Many things can cause a rash. Common causes include: Viral infections. These include colds, measles, and hand, foot, and mouth disease. Bacterial infections. These include scarlet fever and impetigo. Fungal infections. These include athlete's foot, ringworm, and yeast rashes. Skin irritation. This may be from heat rash, exposure to moisture or friction for a long time (intertrigo), or exposure to soap or skin care products (eczema). Allergic reactions. These may be caused by foods, medicines, or things like poison ivy. Some rashes may go away after a few days. Others may last for a few weeks. The goal of treatment is to stop the itching and keep the rash from spreading. Follow these instructions at home: Medicine Take or apply over-the-counter and prescription medicines only as told by your health care provider. These may include: Corticosteroids. These can help treat red or swollen skin. They may be given as creams or as medicines to take by mouth (orally). Anti-itch lotions. Allergy medicines. Pain medicine. Antifungal medicine if the rash is from a fungal infection. Antibiotics if you have an infection.  Skin care Apply cool, wet cloths (compresses) to the affected areas. Do not scratch or rub your skin. Avoid covering the rash. Keep it exposed to air as often as you can. Managing itching and discomfort Avoid hot showers and baths. These can make itching worse. A cold shower may help. Try taking a bath with: Epsom salts. You can get these at your local pharmacy or grocery store. Follow the instructions on the package. Baking soda. Pour a small amount into the bath as told by your provider. Colloidal oatmeal. You can get this at your local pharmacy or grocery store. Follow the instructions on the package. Try putting baking soda paste on your skin. Stir water into baking  soda until it becomes like a paste. Try using calamine lotion or cortisone cream to help with itchiness. Keep cool. Stay out of the sun. Sweating and being hot can make itching worse. General instructions  Rest as needed. Drink enough fluid to keep your pee (urine) pale yellow. Wear loose-fitting clothes. Avoid scented soaps, detergents, and perfumes. Use gentle soaps, detergents, perfumes, and cosmetics. Avoid the things that cause your rash (triggers). Keep a journal to help keep track of your triggers. Write down: What you eat. What cosmetics you use. What you drink. What you wear. This includes jewelry. Contact a health care provider if: You sweat at night more than normal. You pee (urinate) more or less than normal, or your pee is a darker color than normal. Your eyes become sensitive to light. Your skin or the white parts of your eyes turn yellow (jaundice). Your skin tingles or is numb. You get painful blisters in your nose or mouth. Your rash does not go away after a few days, or it gets worse. You are more tired or thirsty than normal. You have new or worse symptoms. These may include: Pain in your abdomen. Fever. Diarrhea or vomiting. Weakness or weight loss. Get help right away if: You get confused. You have a severe headache, a stiff neck, or severe joint pain or stiffness. You become very sleepy or not responsive. You have a seizure. This information is not intended to replace advice given to you by your health care provider. Make sure you discuss any questions you have with your health care provider. Document Revised: 04/21/2022 Document Reviewed:  04/21/2022 Elsevier Patient Education  2024 [employer].

## 2024-01-18 MED ORDER — AMOXICILLIN-POT CLAVULANATE 875-125 MG PO TABS: 1.0000 | ORAL_TABLET | Freq: Two times a day (BID) | ORAL | 0 refills | Status: DC

## 2024-01-18 NOTE — Progress Notes (Signed)
 E-Visit for Dental Pain  We are sorry that you are not feeling well.  Here is how we plan to help!  Based on what you have shared with me in the questionnaire, it sounds like you have a dental abscess.  I have prescribed: Augmentin  875-125mg  twice a day for 7 days  It is imperative that you see a dentist within 10 days of this eVisit to determine the cause of the dental pain and be sure it is adequately treated  A toothache or tooth pain is caused when the nerve in the root of a tooth or surrounding a tooth is irritated. Dental (tooth) infection, decay, injury, or loss of a tooth are the most common causes of dental pain. Pain may also occur after an extraction (tooth is pulled out). Pain sometimes originates from other areas and radiates to the jaw, thus appearing to be tooth pain.Bacteria growing inside your mouth can contribute to gum disease and dental decay, both of which can cause pain. A toothache occurs from inflammation of the central portion of the tooth called pulp. The pulp contains nerve endings that are very sensitive to pain. Inflammation to the pulp or pulpitis may be caused by dental cavities, trauma, and infection.    HOME CARE:   For toothaches: Over-the-counter pain medications such as acetaminophen  or ibuprofen  may be used. Take these as directed on the package while you arrange for a dental appointment. Avoid very cold or hot foods, because they may make the pain worse. You may get relief from biting on a cotton ball soaked in oil of cloves. You can get oil of cloves at most drug stores.  For jaw pain:  Aspirin may be helpful for problems in the joint of the jaw in adults. If pain happens every time you open your mouth widely, the temporomandibular joint (TMJ) may be the source of the pain. Yawning or taking a large bite of food may worsen the pain. An appointment with your doctor or dentist will help you find the cause.     GET HELP RIGHT AWAY IF:  You have a high  fever or chills If you have had a recent head or face injury and develop headache, light headedness, nausea, vomiting, or other symptoms that concern you after an injury to your face or mouth, you could have a more serious injury in addition to your dental injury. A facial rash associated with a toothache: This condition may improve with medication. Contact your doctor for them to decide what is appropriate. Any jaw pain occurring with chest pain: Although jaw pain is most commonly caused by dental disease, it is sometimes referred pain from other areas. People with heart disease, especially people who have had stents placed, people with diabetes, or those who have had heart surgery may have jaw pain as a symptom of heart attack or angina. If your jaw or tooth pain is associated with lightheadedness, sweating, or shortness of breath, you should see a doctor as soon as possible. Trouble swallowing or excessive pain or bleeding from gums: If you have a history of a weakened immune system, diabetes, or steroid use, you may be more susceptible to infections. Infections can often be more severe and extensive or caused by unusual organisms. Dental and gum infections in people with these conditions may require more aggressive treatment. An abscess may need draining or IV antibiotics, for example.  MAKE SURE YOU   Understand these instructions. Will watch your condition. Will get help right away  if you are not doing well or get worse.  Thank you for choosing an e-visit.  Your e-visit answers were reviewed by a board certified advanced clinical practitioner to complete your personal care plan. Depending upon the condition, your plan could have included both over the counter or prescription medications.  Please review your pharmacy choice. Make sure the pharmacy is open so you can pick up prescription now. If there is a problem, you may contact your provider through [employer] and have the prescription  routed to another pharmacy.  Your safety is important to us . If you have drug allergies check your prescription carefully.   For the next 24 hours you can use MyChart to ask questions about today's visit, request a non-urgent call back, or ask for a work or school excuse. You will get an email in the next two days asking about your experience. I hope that your e-visit has been valuable and will speed your recovery.   I have spent 5 minutes in review of e-visit questionnaire, review and updating patient chart, medical decision making and response to patient.   [name], PA-C

## 2024-01-21 ENCOUNTER — Ambulatory Visit (INDEPENDENT_AMBULATORY_CARE_PROVIDER_SITE_OTHER): Admitting: Internal Medicine

## 2024-01-21 ENCOUNTER — Encounter (INDEPENDENT_AMBULATORY_CARE_PROVIDER_SITE_OTHER): Payer: Self-pay | Admitting: Internal Medicine

## 2024-01-21 VITALS — BP 138/86 | HR 87 | Temp 98.2°F | Ht 67.0 in | Wt 365.0 lb

## 2024-01-21 DIAGNOSIS — R7303 Prediabetes: Secondary | ICD-10-CM | POA: Diagnosis not present

## 2024-01-21 DIAGNOSIS — Z6841 Body Mass Index (BMI) 40.0 and over, adult: Secondary | ICD-10-CM

## 2024-01-21 DIAGNOSIS — I10 Essential (primary) hypertension: Secondary | ICD-10-CM | POA: Diagnosis not present

## 2024-01-21 DIAGNOSIS — E66813 Obesity, class 3: Secondary | ICD-10-CM

## 2024-01-21 MED ORDER — TOPIRAMATE 25 MG PO TABS: 25.0000 mg | ORAL_TABLET | Freq: Every evening | ORAL | 0 refills | Status: DC

## 2024-01-21 MED ORDER — PHENTERMINE HCL 37.5 MG PO TABS: 18.7500 mg | ORAL_TABLET | Freq: Every day | ORAL | 0 refills | Status: DC

## 2024-01-21 NOTE — Progress Notes (Unsigned)
 Office: [phone]  /  Fax: [phone]  Weight Summary and Body Composition Analysis (BIA)  Vitals Temp: 98.2 F (36.8 C) BP: 138/86 Pulse Rate: 87 SpO2: 100 %   Anthropometric Measurements Height: 5' 7 (1.702 m) Weight: (!) 365 lb (165.6 kg) BMI (Calculated): 57.15 Weight at Last Visit: 365 lb Weight Lost Since Last Visit: 0 lb Weight Gained Since Last Visit: 0 lb Starting Weight: 368 lb Total Weight Loss (lbs): 3 lb (1.361 kg) Peak Weight: 391 lb   Body Composition  Body Fat %: 56.1 % Fat Mass (lbs): 204.8 lbs Muscle Mass (lbs): 152.4 lbs Total Body Water (lbs): 114.4 lbs Visceral Fat Rating : 22    RMR: [id number]  Today's Visit #: 5  Starting Date: 10/24/23   Subjective   Chief Complaint: Obesity  Interval History Discussed the use of AI scribe software for clinical note transcription with the patient, who gave verbal consent to proceed.  History of Present Illness   Lindsey Figueroa is a 38 year old female with obesity who presents for medical weight management.  Since last office visit she has maintained she reports following the 1900-calorie nutrition plan with fair adherence.  She is not tracking she reports eating more whole foods getting the recommended amount of protein but occasionally skips meals she is exercising 3 days a week about 20 minutes.  She has been using phentermine  for weight management and has been doing well with it. She experienced a brief period without the medication due to refill timing, during which she felt slightly hungrier than usual but not severely so. Her peak weight was 391 pounds, reached in 2023 or 2024, and her current weight is 365 pounds. She has been on metformin  and has previously used topiramate . Her insurance does not cover GLP-1 medications.  She experiences difficulty with consuming meat, reporting that it upsets her stomach and causes crampy pain, particularly with beef and ground [place]. She has been  incorporating more protein shakes and meal replacements into her diet due to these issues. Eggs also make her feel sick, which is a new development.  She has been on multiple medications recently, including amoxicillin  for a dental abscess and prednisone  for a rash. She wonders if the combination is affecting her digestion. She is also taking vitamin D  once a week and has stopped taking allergy medications as she finds them ineffective.  She manages her diet by consuming soups and smoothies, especially during the time she had a dental abscess. She uses premier protein in her smoothies and is seeking alternatives to meat for protein intake, considering plant-based options and seafood. She is exploring recipes and meal plans that exclude beef, ground [place], and whole eggs.       Challenges affecting patient progress: having difficulty with meal prep and planning, difficulty implementing reduced calorie nutrition plan, limited food variation or intolerances, low volume of physical activity at present , and having difficulties with GLP-1 or AOM coverage.    Pharmacotherapy for weight management: She is currently taking Phentermine  (longterm use, single agent)  with adequate clinical response  and without side effects..   Assessment and Plan   Treatment Plan For Obesity:  Recommended Dietary Goals  Tailey is currently in the action stage of change. As such, her goal is to continue weight management plan. She has agreed to: follow the Category 4 plan - 1800 kcal per day  Behavioral Health and Counseling  We discussed the following behavioral modification strategies today: continue to work  on maintaining a reduced calorie state, getting the recommended amount of protein, incorporating whole foods, making healthy choices, staying well hydrated and practicing mindfulness when eating..  Additional education and resources provided today: She was provided with an AI generated tailored nutrition plan  with exceptions targeting 1800 cal 120 to 140 g of protein per day  Recommended Physical Activity Goals  Jannifer has been advised to work up to 150 minutes of moderate intensity aerobic activity a week and strengthening exercises 2-3 times per week for cardiovascular health, weight loss maintenance and preservation of muscle mass.   She has agreed to :  Increase physical activity in their day and reduce sedentary time (increase NEAT). and continue to gradually increase the amount and intensity of exercise routine  Medical Interventions and Pharmacotherapy  We discussed various medication options to help [facility] with her weight loss efforts and we both agreed to : Start anti-obesity medication.  In addition to reduced calorie nutrition plan (RCNP), behavioral strategies and physical activity, Neve would benefit from pharmacotherapy to assist with hunger signals, satiety and cravings. This will reduce obesity-related health risks by inducing weight loss, and help reduce food consumption and adherence to [facility]) . It may also improve QOL by improving self-confidence and reduce the  setbacks associated with metabolic adaptations.  She does not have coverage for GLP-1.  Qsymia is cost prohibitive.  She is currently on phentermine  18.75 mg once a day off label as a result.  After discussion of treatment options, mechanisms of action, benefits, side effects, contraindications and shared decision making she is agreeable to starting topiramate  25 mg in the evening. Patient also made aware that medication is indicated for long-term management of obesity and the risk of weight regain following discontinuation of treatment and hence the importance of adhering to medical weight loss plan.    Initiated topiramate  for off-label use in weight management. Explained medication is FDA-approved for seizures/migraines but not weight loss alone. Reviewed evidence of efficacy in appetite suppression and use in combination  weight loss therapy (e.g., Qsymia).  Risks reviewed: cognitive slowing, fatigue, paresthesia, mood effects, teratogenicity (need for reliable means of birth control in women of childbearing age). Alternatives discussed. Patient informed of off-label nature and consents to treatment. Will titrate dose gradually and monitor monthly.   Associated Conditions Impacted by Obesity Treatment  Assessment & Plan Class 3 severe obesity with serious comorbidity and body mass index (BMI) of 50.0 to 59.9 in adult Frederick has a BMI of 57 and a current weight of 365 lbs, reduced from a peak of 391 lbs in 2023. She is on phentermine  and experiences mild hunger when off the medication briefly. She is considering adding topiramate  for enhanced appetite suppression. Discussed the obesity treatment pyramid, emphasizing lifestyle changes as foundational. Explained potential weight loss: lifestyle changes alone (~10%), medications like Qsymia (~10%), endoscopic procedures (~20%), and surgical options like gastric bypass (30-40%). Highlighted cost and insurance coverage issues. She is open to gastric bypass but concerned about cost and insurance. Surgical treatment may be more beneficial given her high BMI and reluctance for long-term medication use. Discussed topiramate 's contraindication in pregnancy due to birth defect risk. - Continue phentermine  18.75 mg daily in the morning. - Add topiramate  25 mg in the evening for appetite suppression. - Discuss potential referral for gastric bypass after six months of medically supervised weight management. - Provide nutritional guidance focusing on an 1800 calorie diet with high protein, incorporating plant-based proteins, seafood, and dairy. - Explore meal replacement  options like Soylent and protein smoothies. - Consider prepackaged meals from [employer] for convenience. Hypertension, essential Patient is returning to care she apparently discontinue use of amlodipine .   Her blood pressure today slightly above target.  She is not interested in restarting medication.  She is on low-dose phentermine  as her insurance does not cover GLP-1 but we need to closely monitor her blood pressure.  She may need to start antihypertensives to be able to tolerate sympathomimetic. Prediabetes Most recent A1c is  Lab Results  Component Value Date   HGBA1C 5.9 (H) 10/24/2023   HGBA1C 6.0 07/24/2011    Patient aware of disease state and risk of progression. This may contribute to abnormal cravings, fatigue and diabetic complications without having diabetes.   We have discussed treatment options which include: losing 7 to 10% of body weight, increasing physical activity to a goal of 150 minutes a week at moderate intensity.  Advised to maintain a diet low on simple and processed carbohydrates.  She had been on metformin  in the past but does not like to take medications.  Reports not being good at taking medications daily.  Continue with current weight management strategy consider adding metformin  down the line.  We also discussed the role of metabolic surgery.            Objective   Physical Exam:  Blood pressure 138/86, pulse 87, temperature 98.2 F (36.8 C), height 5' 7 (1.702 m), weight (!) 365 lb (165.6 kg), last menstrual period 12/31/2023, SpO2 100%. Body mass index is 57.17 kg/m.  General: She is overweight, cooperative, alert, well developed, and in no acute distress. PSYCH: Has normal mood, affect and thought process.   HEENT: EOMI, sclerae are anicteric. Lungs: Normal breathing effort, no conversational dyspnea. Extremities: No edema.  Neurologic: No gross sensory or motor deficits. No tremors or fasciculations noted.    Diagnostic Data Reviewed:  BMET    Component Value Date/Time   NA 139 10/24/2023 1031   K 4.3 10/24/2023 1031   CL 103 10/24/2023 1031   CO2 20 10/24/2023 1031   GLUCOSE 92 10/24/2023 1031   GLUCOSE 98 03/30/2023 1425   BUN 8  10/24/2023 1031   CREATININE 0.65 10/24/2023 1031   CREATININE 0.76 03/30/2023 1425   CALCIUM 9.0 10/24/2023 1031   GFRNONAA >60 03/17/2020 1424   GFRAA >60 03/17/2020 1424   Lab Results  Component Value Date   HGBA1C 5.9 (H) 10/24/2023   HGBA1C 6.0 07/24/2011   Lab Results  Component Value Date   INSULIN  14.1 10/24/2023   INSULIN  14.9 10/12/2020   Lab Results  Component Value Date   TSH 2.310 10/24/2023   CBC    Component Value Date/Time   WBC 5.9 10/24/2023 1031   WBC 8.2 02/17/2022 1004   RBC 5.18 10/24/2023 1031   RBC 5.13 (H) 02/17/2022 1004   HGB 12.6 10/24/2023 1031   HCT 38.9 10/24/2023 1031   PLT 275 10/24/2023 1031   MCV 75 (L) 10/24/2023 1031   MCH 24.3 (L) 10/24/2023 1031   MCH 23.8 (L) 03/17/2020 1424   MCHC 32.4 10/24/2023 1031   MCHC 32.4 02/17/2022 1004   RDW 15.7 (H) 10/24/2023 1031   Iron Studies No results found for: IRON, TIBC, FERRITIN, IRONPCTSAT Lipid Panel     Component Value Date/Time   CHOL 171 10/24/2023 1031   TRIG 84 10/24/2023 1031   HDL 67 10/24/2023 1031   CHOLHDL 2 02/17/2022 1004   VLDL 10.4 02/17/2022  1004   LDLCALC 88 10/24/2023 1031   LDLCALC 61 12/21/2017 1509   Hepatic Function Panel     Component Value Date/Time   PROT 6.8 10/24/2023 1031   ALBUMIN 4.1 10/24/2023 1031   AST 21 10/24/2023 1031   ALT 16 10/24/2023 1031   ALKPHOS 80 10/24/2023 1031   BILITOT 0.3 10/24/2023 1031      Component Value Date/Time   TSH 2.310 10/24/2023 1031   Nutritional Lab Results  Component Value Date   VD25OH 13.1 (L) 10/24/2023   VD25OH 23.8 (L) 08/28/2022   VD25OH 6.7 (L) 04/10/2022    Medications: Outpatient Encounter Medications as of 01/21/2024  Medication Sig   topiramate  (TOPAMAX ) 25 MG tablet Take 1 tablet (25 mg total) by mouth every evening.   amoxicillin -clavulanate (AUGMENTIN ) 875-125 MG tablet Take 1 tablet by mouth 2 (two) times daily.   cetirizine  (ZYRTEC ) 10 MG tablet Take 1 tablet (10 mg total) by  mouth daily.   famotidine  (PEPCID ) 20 MG tablet Take 1 tablet (20 mg total) by mouth 2 (two) times daily.   ferrous sulfate  325 (65 FE) MG tablet Take 1 tablet (325 mg total) by mouth daily with breakfast.   FLUoxetine  (PROZAC ) 20 MG tablet Take 1 tablet (20 mg total) by mouth daily.   [START ON 02/11/2024] phentermine  (ADIPEX-P ) 37.5 MG tablet Take 0.5 tablets (18.75 mg total) by mouth daily before breakfast.   triamcinolone  cream (KENALOG ) 0.1 % Apply 1 Application topically 2 (two) times daily.   Vitamin D , Ergocalciferol , (DRISDOL ) 1.25 MG (50000 UNIT) CAPS capsule Take 1 capsule (50,000 Units total) by mouth every 7 (seven) days.   [DISCONTINUED] phentermine  (ADIPEX-P ) 37.5 MG tablet Take 0.5 tablets (18.75 mg total) by mouth daily before breakfast.   No facility-administered encounter medications on file as of 01/21/2024.     Follow-Up   No follow-ups on file.SABRA She was informed of the importance of frequent follow up visits to maximize her success with intensive lifestyle modifications for her multiple health conditions.  Attestation Statement   Reviewed by clinician on day of visit: allergies, medications, problem list, medical history, surgical history, family history, social history, and previous encounter notes.   I have spent 41 minutes in the care of the patient today including: 3 minutes before the visit reviewing and preparing the chart. 32 minutes face-to-face assessing and reviewing listed medical problems as outlined in obesity care plan, providing nutritional and behavioral counseling on topics outlined in the obesity care plan, counseling regarding anti-obesity medication as outlined in obesity care plan, independently interpreting test results and goals of care, as described in assessment and plan, reviewing and discussing biometric information and progress, and ordering medications - see orders 6 minutes after the visit updating chart and documentation of  encounter.    Lucas Parker, MD

## 2024-01-22 NOTE — Assessment & Plan Note (Signed)
 Patient is returning to care she apparently discontinue use of amlodipine .  Her blood pressure today slightly above target.  She is not interested in restarting medication.  She is on low-dose phentermine  as her insurance does not cover GLP-1 but we need to closely monitor her blood pressure.  She may need to start antihypertensives to be able to tolerate sympathomimetic.

## 2024-01-22 NOTE — Assessment & Plan Note (Signed)
 Nery has a BMI of 57 and a current weight of 365 lbs, reduced from a peak of 391 lbs in 2023. She is on phentermine  and experiences mild hunger when off the medication briefly. She is considering adding topiramate  for enhanced appetite suppression. Discussed the obesity treatment pyramid, emphasizing lifestyle changes as foundational. Explained potential weight loss: lifestyle changes alone (~10%), medications like Qsymia (~10%), endoscopic procedures (~20%), and surgical options like gastric bypass (30-40%). Highlighted cost and insurance coverage issues. She is open to gastric bypass but concerned about cost and insurance. Surgical treatment may be more beneficial given her high BMI and reluctance for long-term medication use. Discussed topiramate 's contraindication in pregnancy due to birth defect risk. - Continue phentermine  18.75 mg daily in the morning. - Add topiramate  25 mg in the evening for appetite suppression. - Discuss potential referral for gastric bypass after six months of medically supervised weight management. - Provide nutritional guidance focusing on an 1800 calorie diet with high protein, incorporating plant-based proteins, seafood, and dairy. - Explore meal replacement options like Soylent and protein smoothies. - Consider prepackaged meals from [employer] for convenience.

## 2024-01-22 NOTE — Assessment & Plan Note (Signed)
 Most recent A1c is  Lab Results  Component Value Date   HGBA1C 5.9 (H) 10/24/2023   HGBA1C 6.0 07/24/2011    Patient aware of disease state and risk of progression. This may contribute to abnormal cravings, fatigue and diabetic complications without having diabetes.   We have discussed treatment options which include: losing 7 to 10% of body weight, increasing physical activity to a goal of 150 minutes a week at moderate intensity.  Advised to maintain a diet low on simple and processed carbohydrates.  She had been on metformin  in the past but does not like to take medications.  Reports not being good at taking medications daily.  Continue with current weight management strategy consider adding metformin  down the line.  We also discussed the role of metabolic surgery.

## 2024-02-06 ENCOUNTER — Other Ambulatory Visit (INDEPENDENT_AMBULATORY_CARE_PROVIDER_SITE_OTHER): Payer: Self-pay | Admitting: Internal Medicine

## 2024-02-06 DIAGNOSIS — E559 Vitamin D deficiency, unspecified: Secondary | ICD-10-CM

## 2024-02-13 ENCOUNTER — Encounter (INDEPENDENT_AMBULATORY_CARE_PROVIDER_SITE_OTHER): Payer: Self-pay | Admitting: Internal Medicine

## 2024-02-23 ENCOUNTER — Other Ambulatory Visit (INDEPENDENT_AMBULATORY_CARE_PROVIDER_SITE_OTHER): Payer: Self-pay | Admitting: Internal Medicine

## 2024-02-23 DIAGNOSIS — Z6841 Body Mass Index (BMI) 40.0 and over, adult: Secondary | ICD-10-CM

## 2024-02-25 ENCOUNTER — Encounter (INDEPENDENT_AMBULATORY_CARE_PROVIDER_SITE_OTHER): Payer: Self-pay | Admitting: Internal Medicine

## 2024-02-25 ENCOUNTER — Ambulatory Visit (INDEPENDENT_AMBULATORY_CARE_PROVIDER_SITE_OTHER): Admitting: Internal Medicine

## 2024-02-25 VITALS — BP 128/82 | HR 79 | Temp 98.1°F | Ht 67.0 in | Wt 364.0 lb

## 2024-02-25 DIAGNOSIS — Z6841 Body Mass Index (BMI) 40.0 and over, adult: Secondary | ICD-10-CM

## 2024-02-25 DIAGNOSIS — R7303 Prediabetes: Secondary | ICD-10-CM | POA: Diagnosis not present

## 2024-02-25 DIAGNOSIS — E66813 Obesity, class 3: Secondary | ICD-10-CM | POA: Diagnosis not present

## 2024-02-25 MED ORDER — VITAMIN D3 50 MCG (2000 UT) PO CAPS: 2000.0000 [IU] | ORAL_CAPSULE | Freq: Every day | ORAL | Status: DC

## 2024-02-25 MED ORDER — TOPIRAMATE 25 MG PO TABS: 25.0000 mg | ORAL_TABLET | Freq: Every evening | ORAL | 0 refills | Status: DC

## 2024-02-25 MED ORDER — METFORMIN HCL ER 500 MG PO TB24: 500.0000 mg | ORAL_TABLET | Freq: Every day | ORAL | 0 refills | Status: DC

## 2024-02-25 NOTE — Assessment & Plan Note (Signed)
 Restart metformin  500 mg for pharmacoprophylaxis.  Most recent A1c was 5.9.

## 2024-02-25 NOTE — Assessment & Plan Note (Signed)
 Diet-resistant obesity with insulin  resistance.  Patient has been coming to our program since 2022 has been seen by other providers has tried multiple nutritional strategies including antiobesity medications.  Despite this her weight loss has been minimal even though she is maintaining calorie reduction and appetite is adequately suppressed. Currently on phentermine  and topiramate  (Qsymia) for appetite suppression, which is effective. Not adhering to the 1800 calorie nutrition plan and staying under and often skips meals. Inadequate protein intake may affect metabolism. Exercise is being increased, which is encouraged to improve metabolism and muscle fiber activity. Metformin  was previously discontinued but will be reintroduced to help lower insulin  levels. Discussed potential for surgical intervention if no response in six weeks. - Reintroduce metformin  in the morning with breakfast. - Continue phentermine  in the morning and topiramate  at night. - Focus on increasing protein intake to 30-40 grams per meal. - Encourage use of prepackaged meals to ensure adequate calorie and protein intake. - Increase physical activity to improve metabolism. - Check insurance coverage for gastric bypass. - Reach out to Dr. Tanda for information on diet-resistant obesity and bariatric surgery. - Regroup in six weeks to assess progress.  -Reviewed sleep study sleep apnea has been ruled out

## 2024-02-25 NOTE — Progress Notes (Signed)
 Office: [phone]  /  Fax: [phone]  Weight Summary and Body Composition Analysis (BIA)  Vitals Temp: 98.1 F (36.7 C) BP: 128/82 Pulse Rate: 79 SpO2: 95 %   Anthropometric Measurements Height: 5' 7 (1.702 m) Weight: (!) 364 lb (165.1 kg) BMI (Calculated): 57 Weight at Last Visit: 365 lb Weight Lost Since Last Visit: 1 lb Weight Gained Since Last Visit: 0 lb Starting Weight: 368 lb Total Weight Loss (lbs): 4 lb (1.814 kg) Peak Weight: 391lb   Body Composition  Body Fat %: 57.1 % Fat Mass (lbs): 208 lbs Muscle Mass (lbs): 148.4 lbs Total Body Water (lbs): 119.6 lbs Visceral Fat Rating : 22    RMR: 2261  Today's Visit #: 6  Starting Date: 10/24/23   Subjective   Chief Complaint: Obesity  Interval History Discussed the use of AI scribe software for clinical note transcription with the patient, who gave verbal consent to proceed.  History of Present Illness   Lindsey Figueroa is a 38 year old female with hypertension and insulin  resistance who presents for medical weight management.  She follows an 1800 calorie nutrition plan with approximately 40% adherence, often skipping meals due to lack of hunger. She uses a meal replacement once a day, typically for lunch, and consumes coffee with collagen protein in the morning. She is concerned that her current eating habits may not provide sufficient energy for her body.  She is currently taking phentermine  in the morning and topiramate  in the evening without significant side effects. She previously used metformin  but discontinued it due to perceived lack of efficacy.  She has been coming to our center since 2022 was being seen by Dr. Midge during that time patient again encountered difficulties losing weight with nutritional strategies.  She exercises three days a week for 30 to 45 minutes but has not seen significant weight loss. She feels her body is not responding to her efforts and is concerned about  not consuming enough to fuel her physical activity.  A home sleep study showed no significant signs of sleep apnea, although she experiences primary snoring. She wakes frequently during the night and does not feel rested upon waking. She typically sleeps on her side and snores when sleeping on her back. Her sleep efficiency was recorded at 81%, but she often wakes up to use the restroom and struggles to return to sleep, resulting in about 5 to 6 hours of sleep per night.       Challenges affecting patient progress: Lack of response to nutritional strategies and non-GLP-1 antiobesity medication.    Pharmacotherapy for weight management: She is currently taking Topiramate  (off label use, single agent) with adequate clinical response  and without side effects. and Phentermine  (longterm use, single agent)  with adequate clinical response  and without side effects..   Assessment and Plan   Treatment Plan For Obesity:  Recommended Dietary Goals  [name] is currently in the action stage of change. As such, her goal is to continue weight management plan. She has agreed to: incorporate 1-2 meal replacements a day for convenience , continue current plan, and increase protein intake to 30 to 40 g per meal  Behavioral Health and Counseling  We discussed the following behavioral modification strategies today: work on tracking and journaling calories using tracking application and continue to work on maintaining a reduced calorie state, getting the recommended amount of protein, incorporating whole foods, making healthy choices, staying well hydrated and practicing mindfulness when eating..  Additional education  and resources provided today: None  Recommended Physical Activity Goals  Kripa has been advised to work up to 150 minutes of moderate intensity aerobic activity a week and strengthening exercises 2-3 times per week for cardiovascular health, weight loss maintenance and preservation of muscle  mass.   She has agreed to :  continue to gradually increase the amount and intensity of exercise routine  Medical Interventions and Pharmacotherapy  We discussed various medication options to help [facility] with her weight loss efforts and we both agreed to : Continue current dose of phentermine  and topiramate .  She does not have coverage for GLP-1.  Start metformin  XR for insulin  resistance  Associated Conditions Impacted by Obesity Treatment  Assessment & Plan Class 3 severe obesity with serious comorbidity and body mass index (BMI) of 50.0 to 59.9 in adult Diet-resistant obesity with insulin  resistance.  Patient has been coming to our program since 2022 has been seen by other providers has tried multiple nutritional strategies including antiobesity medications.  Despite this her weight loss has been minimal even though she is maintaining calorie reduction and appetite is adequately suppressed. Currently on phentermine  and topiramate  (Qsymia) for appetite suppression, which is effective. Not adhering to the 1800 calorie nutrition plan and staying under and often skips meals. Inadequate protein intake may affect metabolism. Exercise is being increased, which is encouraged to improve metabolism and muscle fiber activity. Metformin  was previously discontinued but will be reintroduced to help lower insulin  levels. Discussed potential for surgical intervention if no response in six weeks. - Reintroduce metformin  in the morning with breakfast. - Continue phentermine  in the morning and topiramate  at night. - Focus on increasing protein intake to 30-40 grams per meal. - Encourage use of prepackaged meals to ensure adequate calorie and protein intake. - Increase physical activity to improve metabolism. - Check insurance coverage for gastric bypass. - Reach out to Dr. Tanda for information on diet-resistant obesity and bariatric surgery. - Regroup in six weeks to assess progress.  -Reviewed sleep study  sleep apnea has been ruled out Prediabetes Restart metformin  500 mg for pharmacoprophylaxis.  Most recent A1c was 5.9.           Objective   Physical Exam:  Blood pressure 128/82, pulse 79, temperature 98.1 F (36.7 C), height 5' 7 (1.702 m), weight (!) 364 lb (165.1 kg), SpO2 95%. Body mass index is 57.01 kg/m.  General: She is overweight, cooperative, alert, well developed, and in no acute distress. PSYCH: Has normal mood, affect and thought process.   HEENT: EOMI, sclerae are anicteric. Lungs: Normal breathing effort, no conversational dyspnea. Extremities: No edema.  Neurologic: No gross sensory or motor deficits. No tremors or fasciculations noted.    Diagnostic Data Reviewed:  BMET    Component Value Date/Time   NA 139 10/24/2023 1031   K 4.3 10/24/2023 1031   CL 103 10/24/2023 1031   CO2 20 10/24/2023 1031   GLUCOSE 92 10/24/2023 1031   GLUCOSE 98 03/30/2023 1425   BUN 8 10/24/2023 1031   CREATININE 0.65 10/24/2023 1031   CREATININE 0.76 03/30/2023 1425   CALCIUM 9.0 10/24/2023 1031   GFRNONAA >60 03/17/2020 1424   GFRAA >60 03/17/2020 1424   Lab Results  Component Value Date   HGBA1C 5.9 (H) 10/24/2023   HGBA1C 6.0 07/24/2011   Lab Results  Component Value Date   INSULIN  14.1 10/24/2023   INSULIN  14.9 10/12/2020   Lab Results  Component Value Date   TSH 2.310 10/24/2023  CBC    Component Value Date/Time   WBC 5.9 10/24/2023 1031   WBC 8.2 02/17/2022 1004   RBC 5.18 10/24/2023 1031   RBC 5.13 (H) 02/17/2022 1004   HGB 12.6 10/24/2023 1031   HCT 38.9 10/24/2023 1031   PLT 275 10/24/2023 1031   MCV 75 (L) 10/24/2023 1031   MCH 24.3 (L) 10/24/2023 1031   MCH 23.8 (L) 03/17/2020 1424   MCHC 32.4 10/24/2023 1031   MCHC 32.4 02/17/2022 1004   RDW 15.7 (H) 10/24/2023 1031   Iron Studies No results found for: IRON, TIBC, FERRITIN, IRONPCTSAT Lipid Panel     Component Value Date/Time   CHOL 171 10/24/2023 1031   TRIG 84  10/24/2023 1031   HDL 67 10/24/2023 1031   CHOLHDL 2 02/17/2022 1004   VLDL 10.4 02/17/2022 1004   LDLCALC 88 10/24/2023 1031   LDLCALC 61 12/21/2017 1509   Hepatic Function Panel     Component Value Date/Time   PROT 6.8 10/24/2023 1031   ALBUMIN 4.1 10/24/2023 1031   AST 21 10/24/2023 1031   ALT 16 10/24/2023 1031   ALKPHOS 80 10/24/2023 1031   BILITOT 0.3 10/24/2023 1031      Component Value Date/Time   TSH 2.310 10/24/2023 1031   Nutritional Lab Results  Component Value Date   VD25OH 13.1 (L) 10/24/2023   VD25OH 23.8 (L) 08/28/2022   VD25OH 6.7 (L) 04/10/2022    Medications: Outpatient Encounter Medications as of 02/25/2024  Medication Sig   amoxicillin -clavulanate (AUGMENTIN ) 875-125 MG tablet Take 1 tablet by mouth 2 (two) times daily.   cetirizine  (ZYRTEC ) 10 MG tablet Take 1 tablet (10 mg total) by mouth daily.   famotidine  (PEPCID ) 20 MG tablet Take 1 tablet (20 mg total) by mouth 2 (two) times daily.   ferrous sulfate  325 (65 FE) MG tablet Take 1 tablet (325 mg total) by mouth daily with breakfast.   FLUoxetine  (PROZAC ) 20 MG tablet Take 1 tablet (20 mg total) by mouth daily.   phentermine  (ADIPEX-P ) 37.5 MG tablet Take 0.5 tablets (18.75 mg total) by mouth daily before breakfast.   topiramate  (TOPAMAX ) 25 MG tablet Take 1 tablet (25 mg total) by mouth every evening.   triamcinolone  cream (KENALOG ) 0.1 % Apply 1 Application topically 2 (two) times daily.   Vitamin D , Ergocalciferol , (DRISDOL ) 1.25 MG (50000 UNIT) CAPS capsule Take 1 capsule (50,000 Units total) by mouth every 7 (seven) days.   No facility-administered encounter medications on file as of 02/25/2024.     Follow-Up   No follow-ups on file.SABRA She was informed of the importance of frequent follow up visits to maximize her success with intensive lifestyle modifications for her multiple health conditions.  Attestation Statement   Reviewed by clinician on day of visit: allergies, medications, problem  list, medical history, surgical history, family history, social history, and previous encounter notes.     Lucas Parker, MD

## 2024-02-28 ENCOUNTER — Ambulatory Visit: Admitting: Family Medicine

## 2024-03-17 ENCOUNTER — Telehealth

## 2024-03-17 DIAGNOSIS — B351 Tinea unguium: Secondary | ICD-10-CM

## 2024-03-18 NOTE — Progress Notes (Signed)
  Because of concern for more deep-seated nail fungus which requires a long course of treatment and need for lab monitoring, I feel your condition warrants further evaluation and I recommend that you be seen in a face-to-face visit.   NOTE: There will be NO CHARGE for this E-Visit   If you are having a true medical emergency, please call 911.     For an urgent face to face visit, [facility] has multiple urgent care centers for your convenience.  Click the link below for the full list of locations and hours, walk-in wait times, appointment scheduling options and driving directions:  Urgent Care - [place], [place], [place], [place], [place], [place]  Equ[employer]     Your MyChart E-visit questionnaire answers were reviewed by a board certified advanced clinical practitioner to complete your personal care plan based on your specific symptoms.    Thank you for using e-Visits.

## 2024-03-18 NOTE — Progress Notes (Signed)
 Duplicate encounter. Will close out as no-charge.

## 2024-03-21 ENCOUNTER — Other Ambulatory Visit: Payer: Self-pay | Admitting: Family Medicine

## 2024-03-21 ENCOUNTER — Encounter: Payer: Self-pay | Admitting: Family Medicine

## 2024-03-21 DIAGNOSIS — B351 Tinea unguium: Secondary | ICD-10-CM

## 2024-03-24 ENCOUNTER — Ambulatory Visit (INDEPENDENT_AMBULATORY_CARE_PROVIDER_SITE_OTHER): Admitting: Internal Medicine

## 2024-03-24 ENCOUNTER — Other Ambulatory Visit (INDEPENDENT_AMBULATORY_CARE_PROVIDER_SITE_OTHER): Payer: Self-pay | Admitting: Internal Medicine

## 2024-03-24 ENCOUNTER — Encounter (INDEPENDENT_AMBULATORY_CARE_PROVIDER_SITE_OTHER): Payer: Self-pay | Admitting: Internal Medicine

## 2024-03-24 VITALS — BP 126/80 | HR 81 | Temp 97.6°F | Ht 67.0 in | Wt 366.0 lb

## 2024-03-24 DIAGNOSIS — R7303 Prediabetes: Secondary | ICD-10-CM | POA: Diagnosis not present

## 2024-03-24 DIAGNOSIS — E66813 Obesity, class 3: Secondary | ICD-10-CM | POA: Diagnosis not present

## 2024-03-24 DIAGNOSIS — Z6841 Body Mass Index (BMI) 40.0 and over, adult: Secondary | ICD-10-CM

## 2024-03-24 DIAGNOSIS — E88819 Insulin resistance, unspecified: Secondary | ICD-10-CM

## 2024-03-24 DIAGNOSIS — I10 Essential (primary) hypertension: Secondary | ICD-10-CM

## 2024-03-24 MED ORDER — TOPIRAMATE 25 MG PO TABS: 25.0000 mg | ORAL_TABLET | Freq: Every evening | ORAL | 0 refills | Status: DC

## 2024-03-24 MED ORDER — PHENTERMINE HCL 37.5 MG PO TABS: 37.5000 mg | ORAL_TABLET | Freq: Every day | ORAL | 0 refills | Status: DC

## 2024-03-24 NOTE — Progress Notes (Unsigned)
 Office: [phone]  /  Fax: [phone]  Weight Summary and Body Composition Analysis (BIA)  Vitals Temp: 97.6 F (36.4 C) BP: 126/80 Pulse Rate: 81 SpO2: 100 %   Anthropometric Measurements Height: 5' 7 (1.702 m) Weight: (!) 366 lb (166 kg) BMI (Calculated): 57.31 Weight at Last Visit: 364 lb Weight Lost Since Last Visit: 0 lb Weight Gained Since Last Visit: 2 lb Starting Weight: 368 lb Total Weight Loss (lbs): 2 lb (0.907 kg) Peak Weight: 391 lb   Body Composition  Body Fat %: 56.4 % Fat Mass (lbs): 206.6 lbs Muscle Mass (lbs): 151.8 lbs Total Body Water (lbs): 119.4 lbs Visceral Fat Rating : 22    RMR: 2261  Today's Visit #: 8  Starting Date: 10/24/23   Subjective   Chief Complaint: Obesity  Interval History Discussed the use of AI scribe software for clinical note transcription with the patient, who gave verbal consent to proceed.  History of Present Illness Lindsey Figueroa is a 38 year old female with prediabetes who presents for medical weight management.  She is on a 1500 calorie nutrition plan with fair adherence and has gained two pounds since the last visit. She exercises four days a week for about 45 minutes each session. Despite trying several nutritional strategies, including an 1800 calorie meal plan, exercise and further reductions in calories, incorporating  one to two meal replacements a day, she reports ongoing challenges.  She is taking phentermine  in the morning and topiramate  in the evening, with no reported side effects. She experiences decreased appetite, stating 'I don't have one,' but eats to maintain energy for workouts. She feels 'very full' after meals and has noticed a slight increase in energy levels. She typically consumes two and a half meals a day, including protein oatmeal for breakfast, a light snack for lunch, and dinner after working out.  She started metformin  at the last visit due to prediabetes and an A1c of 5.9,  with no side effects reported. She has not been tracking her calorie intake but believes she is staying under 1800. She has noticed a slight change in how her clothes fit, having gone down a pant size, but describes the change as not significant.  She does not have coverage for GLP-1 medications and has not looked into coverage requirements for gastric bypass surgery. She is interested in exploring surgical options due to the lack of significant weight loss despite her efforts.  She reports sleeping well and denies any feelings of depression. Her mood is described as 'fine.'     Challenges affecting patient progress: inadequate response to nutritional and behavioral strategies and inadequate response to pharmacotherapy.    Pharmacotherapy for weight management: She is currently taking Metformin  (off label use for weight management and / or insulin  resistance and / or diabetes prevention) with adequate clinical response  and without side effects., Topiramate  (off label use, single agent) without clinical response and without side effects., and Phentermine  (longterm use, off-label, single agent)  without clinical response and without side effects..   Assessment and Plan   Treatment Plan For Obesity:  Recommended Dietary Goals  Daryn is currently in the action stage of change. As such, her goal is to continue weight management plan. She has agreed to: keep a food journal with a target of  1500 calories per day and 90-120 grams of protein per day or 30-40 grams per meal. and continue current plan  Behavioral Health and Counseling  We discussed the following behavioral  modification strategies today: work on tracking and journaling calories using tracking application.  Additional education and resources provided today: Showed videos of gastric bypass and gastric sleeve for differentiation.  We also discussed the benefits, expected weight loss and some associated risk.  Recommended Physical  Activity Goals  Lindsey Figueroa has been advised to work up to 150 minutes of moderate intensity aerobic activity a week and strengthening exercises 2-3 times per week for cardiovascular health, weight loss maintenance and preservation of muscle mass.  She has agreed to :  Think about enjoyable ways to increase daily physical activity and overcoming barriers to exercise, Increase physical activity in their day and reduce sedentary time (increase NEAT)., Increase volume of physical activity to a goal of 240 minutes a week, and Combine aerobic and strengthening exercises for efficiency and improved cardiometabolic health.  Medical Interventions and Pharmacotherapy  We discussed various medication options to help [facility] with her weight loss efforts and we both agreed to : Increase phentermine  to 37.5 mg once a day.  Continue topiramate  25 mg in the evening.  Continue metformin  for diabetes prevention and weight management.  She denies being at risk of pregnancy due to abstinence  Associated Conditions Impacted by Obesity Treatment  Assessment & Plan Hypertension, essential Blood pressure is well-controlled monitor closely while we increase phentermine .  She is no longer on blood pressure medications. Class 3 severe obesity with serious comorbidity and body mass index (BMI) of 50.0 to 59.9 in adult Class 3 obesity with diet resistance Class 3 obesity with diet resistance persists despite adherence to a 1500 calorie diet and regular exercise. Muscle mass has increased while fat mass has decreased, indicating positive body composition changes. Current medications include phentermine  and topiramate , with no reported side effects and adequate appetite suppression.. Metformin  was started at the last visit due to prediabetes. Discussed the potential for gastric bypass surgery, specifically Roux-en-Y, as a more effective long-term solution compared to the gastric sleeve. 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  - Increase phentermine  to 37.5 mg daily. Monitor for side effects such as jitteriness or palpitations and reduce dose if she occurs.  If no response to the higher dose combination will be discontinued as she will be considered a nonresponder. - Provide a new 1500 calorie high-protein nutrition plan with increased meal frequency focused on Premeal protein snacks - Instruct to track and journal dietary intake using apps like lose it or My Fitness Pal. - Research insurance coverage requirements for gastric bypass surgery, including documentation of previous weight management efforts. - Consider referral for Roux-en-Y gastric bypass evaluation if no response to increased phentermine  dose. Prediabetes Insulin  resistance Prediabetes with insulin  resistance Prediabetes with insulin  resistance, managed with metformin  initiated at the last visit. No reported side effects from metformin . - Continue metformin  as previously prescribed.         Objective   Physical Exam:  Blood pressure 126/80, pulse 81, temperature 97.6 F (36.4 C), height 5' 7 (1.702 m), weight (!) 366 lb (166 kg), last menstrual period 03/23/2024, SpO2 100%. Body mass index is 57.32 kg/m.  General: She is overweight, cooperative, alert, well developed, and in no acute distress. PSYCH: Has normal mood, affect and thought process.   HEENT: EOMI, sclerae are anicteric. Lungs: Normal breathing effort, no conversational dyspnea. Extremities: No edema.  Neurologic: No gross sensory or motor deficits. No tremors or fasciculations noted.    Diagnostic Data Reviewed:  BMET    Component Value  Date/Time   NA 139 10/24/2023 1031   K 4.3 10/24/2023 1031   CL 103 10/24/2023 1031   CO2 20 10/24/2023 1031   GLUCOSE 92 10/24/2023 1031   GLUCOSE 98 03/30/2023 1425   BUN 8 10/24/2023 1031   CREATININE 0.65 10/24/2023 1031   CREATININE 0.76 03/30/2023 1425   CALCIUM 9.0  10/24/2023 1031   GFRNONAA >60 03/17/2020 1424   GFRAA >60 03/17/2020 1424   Lab Results  Component Value Date   HGBA1C 5.9 (H) 10/24/2023   HGBA1C 6.0 07/24/2011   Lab Results  Component Value Date   INSULIN  14.1 10/24/2023   INSULIN  14.9 10/12/2020   Lab Results  Component Value Date   TSH 2.310 10/24/2023   CBC    Component Value Date/Time   WBC 5.9 10/24/2023 1031   WBC 8.2 02/17/2022 1004   RBC 5.18 10/24/2023 1031   RBC 5.13 (H) 02/17/2022 1004   HGB 12.6 10/24/2023 1031   HCT 38.9 10/24/2023 1031   PLT 275 10/24/2023 1031   MCV 75 (L) 10/24/2023 1031   MCH 24.3 (L) 10/24/2023 1031   MCH 23.8 (L) 03/17/2020 1424   MCHC 32.4 10/24/2023 1031   MCHC 32.4 02/17/2022 1004   RDW 15.7 (H) 10/24/2023 1031   Iron Studies No results found for: IRON, TIBC, FERRITIN, IRONPCTSAT Lipid Panel     Component Value Date/Time   CHOL 171 10/24/2023 1031   TRIG 84 10/24/2023 1031   HDL 67 10/24/2023 1031   CHOLHDL 2 02/17/2022 1004   VLDL 10.4 02/17/2022 1004   LDLCALC 88 10/24/2023 1031   LDLCALC 61 12/21/2017 1509   Hepatic Function Panel     Component Value Date/Time   PROT 6.8 10/24/2023 1031   ALBUMIN 4.1 10/24/2023 1031   AST 21 10/24/2023 1031   ALT 16 10/24/2023 1031   ALKPHOS 80 10/24/2023 1031   BILITOT 0.3 10/24/2023 1031      Component Value Date/Time   TSH 2.310 10/24/2023 1031   Nutritional Lab Results  Component Value Date   VD25OH 13.1 (L) 10/24/2023   VD25OH 23.8 (L) 08/28/2022   VD25OH 6.7 (L) 04/10/2022    Medications: Outpatient Encounter Medications as of 03/24/2024  Medication Sig   metFORMIN  (GLUCOPHAGE -XR) 500 MG 24 hr tablet Take 1 tablet (500 mg total) by mouth daily with breakfast.   phentermine  (ADIPEX-P ) 37.5 MG tablet Take 0.5 tablets (18.75 mg total) by mouth daily before breakfast.   topiramate  (TOPAMAX ) 25 MG tablet Take 1 tablet (25 mg total) by mouth every evening.   triamcinolone  cream (KENALOG ) 0.1 % Apply 1  Application topically 2 (two) times daily.   [DISCONTINUED] amoxicillin -clavulanate (AUGMENTIN ) 875-125 MG tablet Take 1 tablet by mouth 2 (two) times daily.   [DISCONTINUED] cetirizine  (ZYRTEC ) 10 MG tablet Take 1 tablet (10 mg total) by mouth daily.   [DISCONTINUED] Cholecalciferol (VITAMIN D3) 50 MCG (2000 UT) capsule Take 1 capsule (2,000 Units total) by mouth daily.   [DISCONTINUED] famotidine  (PEPCID ) 20 MG tablet Take 1 tablet (20 mg total) by mouth 2 (two) times daily.   [DISCONTINUED] ferrous sulfate  325 (65 FE) MG tablet Take 1 tablet (325 mg total) by mouth daily with breakfast.   [DISCONTINUED] FLUoxetine  (PROZAC ) 20 MG tablet Take 1 tablet (20 mg total) by mouth daily.   No facility-administered encounter medications on file as of 03/24/2024.     Follow-Up   No follow-ups on file.SABRA She was informed of the importance of frequent follow up visits to maximize her  success with intensive lifestyle modifications for her multiple health conditions.  Attestation Statement   Reviewed by clinician on day of visit: allergies, medications, problem list, medical history, surgical history, family history, social history, and previous encounter notes.     Lucas Parker, MD

## 2024-03-25 NOTE — Assessment & Plan Note (Signed)
 Prediabetes with insulin  resistance Prediabetes with insulin  resistance, managed with metformin  initiated at the last visit. No reported side effects from metformin . - Continue metformin  as previously prescribed.

## 2024-03-25 NOTE — Assessment & Plan Note (Signed)
 Class 3 obesity with diet resistance Class 3 obesity with diet resistance persists despite adherence to a 1500 calorie diet and regular exercise. Muscle mass has increased while fat mass has decreased, indicating positive body composition changes. Current medications include phentermine  and topiramate , with no reported side effects and adequate appetite suppression.. Metformin  was started at the last visit due to prediabetes. Discussed the potential for gastric bypass surgery, specifically Roux-en-Y, as a more effective long-term solution compared to the gastric sleeve. 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  - Increase phentermine  to 37.5 mg daily. Monitor for side effects such as jitteriness or palpitations and reduce dose if she occurs.  If no response to the higher dose combination will be discontinued as she will be considered a nonresponder. - Provide a new 1500 calorie high-protein nutrition plan with increased meal frequency focused on Premeal protein snacks - Instruct to track and journal dietary intake using apps like lose it or My Fitness Pal. - Research insurance coverage requirements for gastric bypass surgery, including documentation of previous weight management efforts. - Consider referral for Roux-en-Y gastric bypass evaluation if no response to increased phentermine  dose.

## 2024-03-25 NOTE — Assessment & Plan Note (Signed)
 Blood pressure is well-controlled monitor closely while we increase phentermine .  She is no longer on blood pressure medications.

## 2024-04-11 ENCOUNTER — Ambulatory Visit: Admitting: Podiatry

## 2024-04-18 ENCOUNTER — Encounter: Payer: Self-pay | Admitting: Podiatry

## 2024-04-18 ENCOUNTER — Ambulatory Visit (INDEPENDENT_AMBULATORY_CARE_PROVIDER_SITE_OTHER): Admitting: Podiatry

## 2024-04-18 DIAGNOSIS — L600 Ingrowing nail: Secondary | ICD-10-CM | POA: Diagnosis not present

## 2024-04-18 DIAGNOSIS — B351 Tinea unguium: Secondary | ICD-10-CM | POA: Diagnosis not present

## 2024-04-21 NOTE — Progress Notes (Signed)
 Subjective:   Patient ID: Lindsey Figueroa, female   DOB: 38 y.o.   MRN: [id number]   HPI Patient presents concerned about trauma and structural changes of the right big toenail and also is very concerned about fungus.  Has numerous concerns about this condition.  Does not smoke likes to be active as best as possible obesity complicating factor   Review of Systems  All other systems reviewed and are negative.       Objective:  Physical Exam Vitals and nursing note reviewed.  Constitutional:      Appearance: She is well-developed.  Pulmonary:     Effort: Pulmonary effort is normal.  Musculoskeletal:        General: Normal range of motion.  Skin:    General: Skin is warm.  Neurological:     Mental Status: She is alert.     Neurovascular status was found to be intact muscle strength is found to be adequate with the patient noted to have discoloration and thickness of the right hallux nail bed localized no looseness noted currently of the bed and multiple signs of probable trauma.  Good digital perfusion well-oriented x 3     Assessment:  Probability for trauma to the right hallux nail with secondary fungal infection possible.  Localized minimal discomfort currently     Plan:  H&P reviewed and at this point we discussed the difference between trauma and fungus and ingrown and I explained and discussed all conditions.  We discussed medications to consider we discussed soaks and if symptoms were to get worse or not improving in the next 4 months we will start medicine but organ to give the nail a chance to grow out first.  All questions answered today for patient

## 2024-04-22 ENCOUNTER — Other Ambulatory Visit (INDEPENDENT_AMBULATORY_CARE_PROVIDER_SITE_OTHER): Payer: Self-pay | Admitting: Internal Medicine

## 2024-04-22 DIAGNOSIS — E66813 Obesity, class 3: Secondary | ICD-10-CM

## 2024-04-28 ENCOUNTER — Telehealth: Admitting: Family Medicine

## 2024-04-28 DIAGNOSIS — M778 Other enthesopathies, not elsewhere classified: Secondary | ICD-10-CM | POA: Diagnosis not present

## 2024-04-28 DIAGNOSIS — M25532 Pain in left wrist: Secondary | ICD-10-CM | POA: Diagnosis not present

## 2024-04-28 MED ORDER — PREDNISONE 20 MG PO TABS: 20.0000 mg | ORAL_TABLET | Freq: Two times a day (BID) | ORAL | 0 refills | Status: AC

## 2024-04-28 NOTE — Progress Notes (Signed)
 Virtual Visit Consent   Lindsey Figueroa, you are scheduled for a virtual visit with a [facility] provider today. Just as with appointments in the office, your consent must be obtained to participate. Your consent will be active for this visit and any virtual visit you may have with one of our providers in the next 365 days. If you have a MyChart account, a copy of this consent can be sent to you electronically.  As this is a virtual visit, video technology does not allow for your provider to perform a traditional examination. This may limit your provider's ability to fully assess your condition. If your provider identifies any concerns that need to be evaluated in person or the need to arrange testing (such as labs, EKG, etc.), we will make arrangements to do so. Although advances in technology are sophisticated, we cannot ensure that it will always work on either your end or our end. If the connection with a video visit is poor, the visit may have to be switched to a telephone visit. With either a video or telephone visit, we are not always able to ensure that we have a secure connection.  By engaging in this virtual visit, you consent to the provision of healthcare and authorize for your insurance to be billed (if applicable) for the services provided during this visit. Depending on your insurance coverage, you may receive a charge related to this service.  I need to obtain your verbal consent now. Are you willing to proceed with your visit today? Lindsey Figueroa has provided verbal consent on 04/28/2024 for a virtual visit (video or telephone). Loa Lamp, FNP  Date: 04/28/2024 4:22 PM   Virtual Visit via Video Note   I, Loa Lamp, connected with  Lindsey Figueroa  ([id number], 1986/05/26) on 04/28/24 at  4:15 PM EDT by a video-enabled telemedicine application and verified that I am speaking with the correct person using two identifiers.  Location: Patient: Virtual Visit Location  Patient: Home Provider: Virtual Visit Location Provider: Home Office   I discussed the limitations of evaluation and management by telemedicine and the availability of in person appointments. The patient expressed understanding and agreed to proceed.    History of Present Illness: Lindsey Figueroa is a 38 y.o. who identifies as a female who was assigned female at birth, and is being seen today for left wrist pain. She was on vacation the past week NKI or cause. She is using her mother wrist splint. She did not take ibuprofen  due to reaction to it a few months ago but can take prednisone . SABRA  HPI: HPI  Problems:  Patient Active Problem List   Diagnosis Date Noted   Iron deficiency 11/07/2023   Hypertension, essential 05/08/2022   Suspected sleep apnea 05/08/2022   Vitamin D  deficiency 05/08/2022   Class 3 severe obesity without serious comorbidity with body mass index (BMI) of 50.0 to 59.9 in adult [facility]) 04/24/2022   Prediabetes 04/11/2022   Depression screening 04/10/2022   Insulin  resistance 12/07/2020   Chronic pain of both knees 09/06/2020   Low back pain with radiation 07/08/2020   Lower extremity edema 07/08/2020   Preventative health care 12/21/2017   Class 3 severe obesity with serious comorbidity and body mass index (BMI) of 50.0 to 59.9 in adult [facility]) 09/22/2013   Vaginal discharge 05/20/2012   UTI symptoms 05/20/2012   Contraception management 04/03/2012   HIP PAIN, LEFT 05/20/2010   Palpitation 05/20/2010    Allergies: No Known  Allergies Medications:  Current Outpatient Medications:    predniSONE  (DELTASONE ) 20 MG tablet, Take 1 tablet (20 mg total) by mouth 2 (two) times daily with a meal for 5 days., Disp: 10 tablet, Rfl: 0   metFORMIN  (GLUCOPHAGE -XR) 500 MG 24 hr tablet, Take 1 tablet (500 mg total) by mouth daily with breakfast., Disp: 30 tablet, Rfl: 0   phentermine  (ADIPEX-P ) 37.5 MG tablet, Take 1 tablet (37.5 mg total) by mouth daily before breakfast., Disp: 30  tablet, Rfl: 0   topiramate  (TOPAMAX ) 25 MG tablet, Take 1 tablet (25 mg total) by mouth every evening., Disp: 30 tablet, Rfl: 0   triamcinolone  cream (KENALOG ) 0.1 %, Apply 1 Application topically 2 (two) times daily., Disp: 30 g, Rfl: 0  Observations/Objective: Patient is well-developed, well-nourished in no acute distress.  Resting comfortably  at home.  Head is normocephalic, atraumatic.  No labored breathing.  Speech is clear and coherent with logical content.  Patient is alert and oriented at baseline.    Assessment and Plan: There are no diagnoses linked to this encounter. Ice, med use and side effects, UC if sx persist or worsen Continue use of splint.   Follow Up Instructions: I discussed the assessment and treatment plan with the patient. The patient was provided an opportunity to ask questions and all were answered. The patient agreed with the plan and demonstrated an understanding of the instructions.  A copy of instructions were sent to the patient via MyChart unless otherwise noted below.     The patient was advised to call back or seek an in-person evaluation if the symptoms worsen or if the condition fails to improve as anticipated.    Siddiq Kaluzny, FNP

## 2024-04-28 NOTE — Patient Instructions (Signed)
 Wrist Pain, Adult There are many things that can cause wrist pain. Some common causes include: An injury to the wrist area, such as a sprain, strain, or broken bone (fracture). Overuse of the joint. A condition that causes increased pressure on a nerve in the wrist (carpal tunnel syndrome). Wear and tear of the joints that occurs with aging (osteoarthritis). Other types of joint inflammation and stiffness (arthritis). Sometimes, the cause of wrist pain is not known. Often, the pain goes away when you follow instructions from your health care provider for relieving pain at home. This may include resting the wrist, icing the wrist, or using a splint or an elastic wrap for a short time. If your wrist pain continues, it is important to tell your provider. Follow these instructions at home: If you have a removable splint or elastic wrap: Wear the splint or wrap as told by your provider. Remove it only as told by your provider. Ask your provider if you may remove it for bathing. Check the skin around the splint or wrap every day. Tell your provider about any concerns. Loosen the splint or wrap if your fingers tingle, become numb, or turn cold and blue. Keep the splint or wrap clean. If the splint or wrap is not waterproof: Do not let it get wet. Cover it with a watertight covering when you take a bath or shower. Managing pain, stiffness, and swelling  If told, put ice on the painful area. If you have a removable splint or wrap, remove it as told by your provider. Put ice in a plastic bag. Place a towel between your skin and the bag or between your splint or wrap and the bag. Leave the ice on for 20 minutes, 2-3 times a day. If your skin turns bright red, remove the ice right away to prevent skin damage. The risk of damage is higher if you cannot feel pain, heat, or cold. Move your fingers often to reduce stiffness and swelling. Raise (elevate) the injured area above the level of your heart while  you are sitting or lying down. Activity Rest your affected wrist as told by your provider. Return to your normal activities as told by your provider. Ask your provider what activities are safe for you. Ask your provider when it is safe to drive if you have a splint or wrap on your wrist. Do exercises as told by your provider. General instructions Pay attention to any changes in your symptoms. Take over-the-counter and prescription medicines only as told by your provider. Contact a health care provider if: You have a sudden, sharp pain in the wrist, hand, or arm that is different or new. Any swelling or bruising on your wrist or hand gets worse. Your skin becomes red, has a rash, or has open sores. Your pain does not get better or it gets worse. You have a fever or chills. Get help right away if: You lose feeling in your fingers or hand. Your fingers turn white, very red, or cold and blue. You cannot move your fingers. This information is not intended to replace advice given to you by your health care provider. Make sure you discuss any questions you have with your health care provider. Document Revised: 04/07/2022 Document Reviewed: 04/07/2022 Elsevier Patient Education  2024 [employer].

## 2024-05-05 ENCOUNTER — Ambulatory Visit (INDEPENDENT_AMBULATORY_CARE_PROVIDER_SITE_OTHER): Admitting: Internal Medicine

## 2024-05-05 ENCOUNTER — Encounter (INDEPENDENT_AMBULATORY_CARE_PROVIDER_SITE_OTHER): Payer: Self-pay | Admitting: Internal Medicine

## 2024-05-05 VITALS — BP 150/100 | HR 74 | Temp 98.4°F | Ht 67.0 in | Wt 359.0 lb

## 2024-05-05 DIAGNOSIS — Z6841 Body Mass Index (BMI) 40.0 and over, adult: Secondary | ICD-10-CM | POA: Diagnosis not present

## 2024-05-05 DIAGNOSIS — E88819 Insulin resistance, unspecified: Secondary | ICD-10-CM | POA: Diagnosis not present

## 2024-05-05 DIAGNOSIS — I10 Essential (primary) hypertension: Secondary | ICD-10-CM

## 2024-05-05 DIAGNOSIS — E66813 Obesity, class 3: Secondary | ICD-10-CM | POA: Diagnosis not present

## 2024-05-05 MED ORDER — AMLODIPINE BESYLATE 5 MG PO TABS: 5.0000 mg | ORAL_TABLET | Freq: Every day | ORAL | 0 refills | Status: DC

## 2024-05-05 MED ORDER — TOPIRAMATE 25 MG PO TABS: 50.0000 mg | ORAL_TABLET | Freq: Every evening | ORAL | 0 refills | Status: DC

## 2024-05-05 MED ORDER — PHENTERMINE HCL 37.5 MG PO TABS: 18.7500 mg | ORAL_TABLET | Freq: Every day | ORAL | Status: DC

## 2024-05-05 NOTE — Assessment & Plan Note (Signed)
 Uncontrolled, is no longer on antihypertensives.  Blood pressure has increased since phentermine  was increased last office visit.  She also has been drinking caffeinated beverages. - Discontinue phentermine  for the time being - Start amlodipine  5 mg once daily starting today - Begin monitoring blood pressure at home, using her wrist.  She will check blood pressure in the morning and also before bedtime.  Was instructed on proper technique.  -Blood pressure goal less than 130/80

## 2024-05-05 NOTE — Assessment & Plan Note (Signed)
 Now on metformin  continue medication for pharmacoprophylaxis.

## 2024-05-05 NOTE — Progress Notes (Signed)
 Office: [phone]  /  Fax: [phone]  Weight Summary And Body Composition Analysis (BIA)  Vitals Temp: 98.4 F (36.9 C) BP: (!) 170/110 Pulse Rate: (!) 57 SpO2: 99 %   Anthropometric Measurements Height: 5' 7 (1.702 m) Weight: (!) 359 lb (162.8 kg) BMI (Calculated): 56.21 Weight at Last Visit: 366 lb Weight Lost Since Last Visit: 7 lb Weight Gained Since Last Visit: 0 Starting Weight: 368 lb Total Weight Loss (lbs): 9 lb (4.082 kg) Peak Weight: 391 lb   Body Composition  Body Fat %: 54.8 % Fat Mass (lbs): 196.8 lbs Muscle Mass (lbs): 154 lbs Total Body Water (lbs): 31.3 lbs Visceral Fat Rating : 21    No data recorded Today's Visit #: 9  Starting Date: 10/24/23   Subjective   Chief Complaint: Obesity  Lindsey Figueroa is here to discuss her progress with her obesity treatment plan. She is following the Category 3 plan - 1500 kcal per day and states she is following her eating plan approximately 30-40% of the time. She states she is exercising 40 minutes 4 times per week..  Weight Progress Since Last Visit:  Since last office visit she has lost 7 pounds. She reports good adherence to reduced calorie nutritional plan. She has been working on reading food labels, not skipping meals, increasing protein intake at every meal, drinking more water, making healthier choices, reducing portion sizes, and incorporating more whole foods   Last office visit we had increased her phentermine  to 37.5 mg.  Her blood pressure today is elevated.  She acknowledges also drinking coffee and energy drinks as part of her daily routine.  We had advised to avoid caffeinated drinks.  She has been on blood pressure medications in the past but is currently not on blood pressure medications.  Patient has had difficult time losing weight since starting her program and this is the first time where she is making progress but now her blood pressure is elevated from phentermine .  She denies any  symptoms.  Occasional insomnia but denies any chest pain or palpitations.  She has noticed an improvement in the food noise as well as satiety and satiation.  Nutritional 24 HR Recall: Intake consistent with prescribed nutritional plan  Challenges affecting patient progress: inadequate response to nutritional and behavioral strategies and inadequate response to pharmacotherapy.   Pharmacotherapy for weight management: She is currently taking Metformin  with diabetes as primary indication with adequate clinical response  and without side effects., Topiramate  (off label use, single agent) with adequate clinical response  and without side effects., and Phentermine  (longterm use, off-label, single agent)  with adequate clinical response  and without side effects..   Has been on metformin  on and off since March 2022 Has been on phentermine  18.75 mg once daily from May through September 2025 Has been on phentermine  37.5 mg once a day since September 2025 Has been on topiramate  since July 2025    Assessment and Plan   Treatment Plan For Obesity:  Recommended Dietary Goals  Lindsey Figueroa is currently in the action stage of change. As such, her goal is to continue weight management plan. She has agreed to: continue current plan  Behavioral Health and Counseling  We discussed the following behavioral modification strategies today: continue to work on maintaining a reduced calorie state, getting the recommended amount of protein, incorporating whole foods, making healthy choices, staying well hydrated and practicing mindfulness when eating. and increase protein intake, fibrous foods (25 grams per day for women, 30 grams for  men) and water to improve satiety and decrease hunger signals. .  Additional education and resources provided today: None  Recommended Physical Activity Goals  Lindsey Figueroa has been advised to work up to 150 minutes of moderate intensity aerobic activity a week and strengthening  exercises 2-3 times per week for cardiovascular health, weight loss maintenance and preservation of muscle mass.   She has agreed to :  Continue current level of physical activity   Pharmacotherapy and Medical Interventions  Because of uncontrolled blood pressure we will discontinue phentermine .  She we will also start amlodipine  5 mg once a day if her blood pressure is under 130/80 we may consider restarting phentermine  but at 18.75 mg daily.  We are increasing the topiramate  to 50 mg in the evening.  She is low risk for pregnancy.  She will continue on metformin  for diabetes prevention and weight management  Associated Conditions Impacted by Obesity Treatment  Assessment & Plan Hypertension, essential Uncontrolled, is no longer on antihypertensives.  Blood pressure has increased since phentermine  was increased last office visit.  She also has been drinking caffeinated beverages. - Discontinue phentermine  for the time being - Start amlodipine  5 mg once daily starting today - Begin monitoring blood pressure at home, using her wrist.  She will check blood pressure in the morning and also before bedtime.  Was instructed on proper technique.  -Blood pressure goal less than 130/80 Insulin  resistance Now on metformin  continue medication for pharmacoprophylaxis. Class 3 severe obesity with serious comorbidity and body mass index (BMI) of 50.0 to 59.9 in adult [facility]) After a prolonged period of inadequate response to nutritional and pharmacotherapy she started to lose weight at the higher dose of phentermine  combined with topiramate  and metformin .  Unfortunately now her blood pressure is uncontrolled we therefore have discontinuing phentermine  until we get her blood pressure under better control.  She was asked to resume antihypertensives she had been on amlodipine  in the past.  We are increasing her topiramate  to 50 mg in the evening.  She will continue on metformin .  I will be seeing her back in 3  weeks she will also monitor her blood pressure and notify us  via the patient portal on progress.    Objective   Physical Exam:  Blood pressure (!) 170/110, pulse (!) 57, temperature 98.4 F (36.9 C), height 5' 7 (1.702 m), weight (!) 359 lb (162.8 kg), SpO2 99%. Body mass index is 56.23 kg/m.  General: She is overweight, cooperative, alert, well developed, and in no acute distress. PSYCH: Has normal mood, affect and thought process.   HEENT: EOMI, sclerae are anicteric. Lungs: Normal breathing effort, no conversational dyspnea. Extremities: No edema.  Neurologic: No gross sensory or motor deficits. No tremors or fasciculations noted.    Diagnostic Data Reviewed:  BMET    Component Value Date/Time   NA 139 10/24/2023 1031   K 4.3 10/24/2023 1031   CL 103 10/24/2023 1031   CO2 20 10/24/2023 1031   GLUCOSE 92 10/24/2023 1031   GLUCOSE 98 03/30/2023 1425   BUN 8 10/24/2023 1031   CREATININE 0.65 10/24/2023 1031   CREATININE 0.76 03/30/2023 1425   CALCIUM 9.0 10/24/2023 1031   GFRNONAA >60 03/17/2020 1424   GFRAA >60 03/17/2020 1424   Lab Results  Component Value Date   HGBA1C 5.9 (H) 10/24/2023   HGBA1C 6.0 07/24/2011   Lab Results  Component Value Date   INSULIN  14.1 10/24/2023   INSULIN  14.9 10/12/2020   Lab Results  Component  Value Date   TSH 2.310 10/24/2023   CBC    Component Value Date/Time   WBC 5.9 10/24/2023 1031   WBC 8.2 02/17/2022 1004   RBC 5.18 10/24/2023 1031   RBC 5.13 (H) 02/17/2022 1004   HGB 12.6 10/24/2023 1031   HCT 38.9 10/24/2023 1031   PLT 275 10/24/2023 1031   MCV 75 (L) 10/24/2023 1031   MCH 24.3 (L) 10/24/2023 1031   MCH 23.8 (L) 03/17/2020 1424   MCHC 32.4 10/24/2023 1031   MCHC 32.4 02/17/2022 1004   RDW 15.7 (H) 10/24/2023 1031   Iron Studies No results found for: IRON, TIBC, FERRITIN, IRONPCTSAT Lipid Panel     Component Value Date/Time   CHOL 171 10/24/2023 1031   TRIG 84 10/24/2023 1031   HDL 67  10/24/2023 1031   CHOLHDL 2 02/17/2022 1004   VLDL 10.4 02/17/2022 1004   LDLCALC 88 10/24/2023 1031   LDLCALC 61 12/21/2017 1509   Hepatic Function Panel     Component Value Date/Time   PROT 6.8 10/24/2023 1031   ALBUMIN 4.1 10/24/2023 1031   AST 21 10/24/2023 1031   ALT 16 10/24/2023 1031   ALKPHOS 80 10/24/2023 1031   BILITOT 0.3 10/24/2023 1031      Component Value Date/Time   TSH 2.310 10/24/2023 1031   Nutritional Lab Results  Component Value Date   VD25OH 13.1 (L) 10/24/2023   VD25OH 23.8 (L) 08/28/2022   VD25OH 6.7 (L) 04/10/2022    Medications: Outpatient Encounter Medications as of 05/05/2024  Medication Sig   metFORMIN  (GLUCOPHAGE -XR) 500 MG 24 hr tablet Take 1 tablet (500 mg total) by mouth daily with breakfast.   triamcinolone  cream (KENALOG ) 0.1 % Apply 1 Application topically 2 (two) times daily.   [DISCONTINUED] phentermine  (ADIPEX-P ) 37.5 MG tablet Take 1 tablet (37.5 mg total) by mouth daily before breakfast.   [DISCONTINUED] topiramate  (TOPAMAX ) 25 MG tablet Take 1 tablet (25 mg total) by mouth every evening.   amLODipine  (NORVASC ) 5 MG tablet Take 1 tablet (5 mg total) by mouth daily.   phentermine  (ADIPEX-P ) 37.5 MG tablet Take 0.5 tablets (18.75 mg total) by mouth daily before breakfast.   topiramate  (TOPAMAX ) 25 MG tablet Take 2 tablets (50 mg total) by mouth every evening.   No facility-administered encounter medications on file as of 05/05/2024.     Follow-Up   Return in about 3 weeks (around 05/26/2024) for For Weight Mangement with Dr. Francyne.SABRA She was informed of the importance of frequent follow up visits to maximize her success with intensive lifestyle modifications for her multiple health conditions.  Attestation Statement   Reviewed by clinician on day of visit: allergies, medications, problem list, medical history, surgical history, family history, social history, and previous encounter notes.     Lucas Francyne, MD

## 2024-05-05 NOTE — Assessment & Plan Note (Signed)
 After a prolonged period of inadequate response to nutritional and pharmacotherapy she started to lose weight at the higher dose of phentermine  combined with topiramate  and metformin .  Unfortunately now her blood pressure is uncontrolled we therefore have discontinuing phentermine  until we get her blood pressure under better control.  She was asked to resume antihypertensives she had been on amlodipine  in the past.  We are increasing her topiramate  to 50 mg in the evening.  She will continue on metformin .  I will be seeing her back in 3 weeks she will also monitor her blood pressure and notify us  via the patient portal on progress.

## 2024-05-12 ENCOUNTER — Encounter (INDEPENDENT_AMBULATORY_CARE_PROVIDER_SITE_OTHER): Payer: Self-pay | Admitting: Internal Medicine

## 2024-05-19 ENCOUNTER — Other Ambulatory Visit (INDEPENDENT_AMBULATORY_CARE_PROVIDER_SITE_OTHER): Payer: Self-pay | Admitting: Internal Medicine

## 2024-05-19 DIAGNOSIS — I10 Essential (primary) hypertension: Secondary | ICD-10-CM

## 2024-05-19 MED ORDER — TELMISARTAN 20 MG PO TABS: 20.0000 mg | ORAL_TABLET | Freq: Every day | ORAL | 0 refills | Status: DC

## 2024-05-19 NOTE — Progress Notes (Signed)
 Patient is experiencing side effects to amlodipine . She will be prescribed Micardis  20 mg once a day for blood pressure. She is no longer taking phentermine  and blood pressure remains elevated.  She is not a good candidate for sympathomimetics due to uncontrolled blood pressure.  Medication will be discontinued

## 2024-05-27 ENCOUNTER — Ambulatory Visit (INDEPENDENT_AMBULATORY_CARE_PROVIDER_SITE_OTHER): Payer: Self-pay | Admitting: Internal Medicine

## 2024-05-27 ENCOUNTER — Encounter (INDEPENDENT_AMBULATORY_CARE_PROVIDER_SITE_OTHER): Payer: Self-pay | Admitting: Internal Medicine

## 2024-05-27 VITALS — BP 150/81 | HR 67 | Temp 97.6°F | Ht 67.0 in | Wt 362.0 lb

## 2024-05-27 DIAGNOSIS — E66813 Obesity, class 3: Secondary | ICD-10-CM

## 2024-05-27 DIAGNOSIS — I10 Essential (primary) hypertension: Secondary | ICD-10-CM | POA: Diagnosis not present

## 2024-05-27 DIAGNOSIS — Z6841 Body Mass Index (BMI) 40.0 and over, adult: Secondary | ICD-10-CM | POA: Diagnosis not present

## 2024-05-27 MED ORDER — PEN NEEDLES 32G X 4 MM MISC
1.0000 [IU] | 2 refills | Status: AC
Start: 2024-05-27 — End: ?

## 2024-05-27 MED ORDER — LIRAGLUTIDE -WEIGHT MANAGEMENT 18 MG/3ML ~~LOC~~ SOPN: PEN_INJECTOR | SUBCUTANEOUS | 0 refills | Status: DC

## 2024-05-27 MED ORDER — TELMISARTAN-HCTZ 40-12.5 MG PO TABS: 1.0000 | ORAL_TABLET | Freq: Every day | ORAL | 0 refills | Status: DC

## 2024-05-27 NOTE — Progress Notes (Unsigned)
 Office: [phone]  /  Fax: [phone]  Weight Summary and Body Composition Analysis (BIA)  Vitals Temp: 97.6 F (36.4 C) BP: (!) 150/81 Pulse Rate: 67 SpO2: 100 %   Anthropometric Measurements Height: 5' 7 (1.702 m) Weight: (!) 362 lb (164.2 kg) BMI (Calculated): 56.68 Weight at Last Visit: 359 lb Weight Lost Since Last Visit: 0 lb Weight Gained Since Last Visit: 3 lb Starting Weight: 368 lb Total Weight Loss (lbs): 6 lb (2.722 kg) Peak Weight: 391 lb   Body Composition  Body Fat %: 57.8 % Fat Mass (lbs): 209.4 lbs Muscle Mass (lbs): 152.8 lbs Visceral Fat Rating : 23    RMR: 2261  Today's Visit #: 10  Starting Date: 10/24/23   Subjective   Chief Complaint: Obesity  Interval History Discussed the use of AI scribe software for clinical note transcription with the patient, who gave verbal consent to proceed.  History of Present Illness Lindsey Figueroa is a 38 year old female with hypertension and prediabetes who presents for medical weight management.  Since last office visit she has gained 3 pounds.  She reports following an 1800-calorie nutrition plan 60% of the time she is exercising 4 days a week 30 to 45 minutes doing cardio and strengthening.  She experiences significant work-related stress due to managing multiple projects, which she describes as 'a lot.' She mentions that she feels like her high blood pressure is normal due to work stress. She denies working excessive hours or weekends.  She is currently taking Micardis  (telmisartan ) for hypertension, prescribed a week ago, and reports no side effects. Previously, she experienced nausea, headaches, and fatigue with amlodipine , which resolved after discontinuation. She is not taking metformin  or amlodipine  due to past side effects.  She has been on topiramate  since July 2025 for appetite and weight management, taking two tablets at night, but does not notice a significant difference in appetite  control compared to when she was on phentermine . She has about half a bottle of topiramate  left.  She is currently taking metformin . She does not recall any side effects from metformin .     Challenges affecting patient progress: inadequate response to nutritional and behavioral strategies and inadequate response to pharmacotherapy.    Pharmacotherapy for weight management: She is currently taking Topiramate  (off label use, single agent) with waning clinical response and without side effects..  Blood pressure was elevated previously on phentermine  medication has been discontinued  Assessment and Plan   Treatment Plan For Obesity:  Recommended Dietary Goals  Lindsey Figueroa is currently in the action stage of change. As such, her goal is to continue weight management plan. She has agreed to: continue current plan  Behavioral Health and Counseling  We discussed the following behavioral modification strategies today: continue to work on maintaining a reduced calorie state, getting the recommended amount of protein, incorporating whole foods, making healthy choices, staying well hydrated and practicing mindfulness when eating. and increase protein intake, fibrous foods (25 grams per day for women, 30 grams for men) and water to improve satiety and decrease hunger signals. .  Additional education and resources provided today: None  Recommended Physical Activity Goals  Lindsey Figueroa has been advised to work up to 150 minutes of moderate intensity aerobic activity a week and strengthening exercises 2-3 times per week for cardiovascular health, weight loss maintenance and preservation of muscle mass.  She has agreed to :  Think about enjoyable ways to increase daily physical activity and overcoming barriers to exercise, Increase physical  activity in their day and reduce sedentary time (increase NEAT)., Increase volume of physical activity to a goal of 240 minutes a week, and Combine aerobic and strengthening  exercises for efficiency and improved cardiometabolic health.  Medical Interventions and Pharmacotherapy  We discussed various medication options to help Lindsey Figueroa with her weight loss efforts and we both agreed to : Start anti-obesity medication.  In addition to reduced calorie nutrition plan (RCNP), behavioral strategies and physical activity, Lindsey Figueroa would benefit from pharmacotherapy to assist with hunger signals, satiety and cravings. This will reduce obesity-related health risks by inducing weight loss, and help reduce food consumption and adherence to [facility]) . It may also improve QOL by improving self-confidence and reduce the  setbacks associated with metabolic adaptations.  Phentermine  raise her blood pressure She had side effects of metformin  She did not have a clinical response to topiramate  She is not interested in weight loss surgery She does not have access to GLP-1  After discussion of treatment options, mechanisms of action, benefits, side effects, contraindications and shared decision making she is agreeable to starting liraglutide  1.8 mg daily.  This was sent into [facility] pharmacy.  Patient also made aware that medication is indicated for long-term management of obesity and the risk of weight regain following discontinuation of treatment and hence the importance of adhering to medical weight loss plan.    Associated Conditions Impacted by Obesity Treatment  Assessment & Plan Hypertension, essential Current management including telmisartan . Blood pressure remains elevated, necessitating adjustment of antihypertensive therapy. Discussed the addition of a low-dose diuretic to enhance the efficacy of telmisartan . Emphasized the importance of blood pressure control to prevent cardiovascular complications. Discussed the potential for weight loss to further aid in blood pressure reduction. - Increase telmisartan  to 40 mg with hydrochlorothiazide  12.5 mg daily - Continue to monitor  blood pressure regularly. - Avoid caffeinated and energy drinks to prevent further elevation of blood pressure. -Amlodipine  discontinued due to intolerance Class 3 severe obesity with serious comorbidity and body mass index (BMI) of 50.0 to 59.9 in adult, unspecified obesity type (HCC) Class 3 obesity with associated insulin  resistance. Current management includes metformin  and topiramate . Topiramate  is being tapered off due to lack of significant appetite suppression. Discussed GLP-1 agonist (liraglutide ) as a new option for weight management, which also aids in diabetes prevention and weight loss. Explained mechanism of action, including appetite suppression and slowed gastric emptying. Discussed potential side effects such as nausea and constipation, and the importance of dietary modifications to avoid gastrointestinal upset. Emphasized the need for adequate fiber and protein intake to prevent muscle loss and support weight loss. Discussed financial considerations and the potential for long-term use of GLP-1 agonist. - Phentermine  discontinued due to hypertension - Taper off topiramate : reduce to one tablet daily for 7 days, then one tablet every other day for 7 days. - Initiate liraglutide : start with 0.6 mg daily for 7 days, then increase to 1.2 mg daily for 7 days, and maintain at 1.8 mg daily thereafter. - Discontinue metformin  upon starting liraglutide . - Ensure adequate fiber and protein intake to prevent muscle loss and support weight loss. - Avoid fatty, sugary foods, and alcohol to prevent gastrointestinal upset. - Purchase pen needles separately from pharmacy.         Objective   Physical Exam:  Blood pressure (!) 150/81, pulse 67, temperature 97.6 F (36.4 C), height 5' 7 (1.702 m), weight (!) 362 lb (164.2 kg), last menstrual period 05/12/2024, SpO2 100%. Body mass index is  56.7 kg/m.  General: She is overweight, cooperative, alert, well developed, and in no acute  distress. PSYCH: Has normal mood, affect and thought process.   HEENT: EOMI, sclerae are anicteric. Lungs: Normal breathing effort, no conversational dyspnea. Extremities: No edema.  Neurologic: No gross sensory or motor deficits. No tremors or fasciculations noted.    Diagnostic Data Reviewed:  BMET    Component Value Date/Time   NA 139 10/24/2023 1031   K 4.3 10/24/2023 1031   CL 103 10/24/2023 1031   CO2 20 10/24/2023 1031   GLUCOSE 92 10/24/2023 1031   GLUCOSE 98 03/30/2023 1425   BUN 8 10/24/2023 1031   CREATININE 0.65 10/24/2023 1031   CREATININE 0.76 03/30/2023 1425   CALCIUM 9.0 10/24/2023 1031   GFRNONAA >60 03/17/2020 1424   GFRAA >60 03/17/2020 1424   Lab Results  Component Value Date   HGBA1C 5.9 (H) 10/24/2023   HGBA1C 6.0 07/24/2011   Lab Results  Component Value Date   INSULIN  14.1 10/24/2023   INSULIN  14.9 10/12/2020   Lab Results  Component Value Date   TSH 2.310 10/24/2023   CBC    Component Value Date/Time   WBC 5.9 10/24/2023 1031   WBC 8.2 02/17/2022 1004   RBC 5.18 10/24/2023 1031   RBC 5.13 (H) 02/17/2022 1004   HGB 12.6 10/24/2023 1031   HCT 38.9 10/24/2023 1031   PLT 275 10/24/2023 1031   MCV 75 (L) 10/24/2023 1031   MCH 24.3 (L) 10/24/2023 1031   MCH 23.8 (L) 03/17/2020 1424   MCHC 32.4 10/24/2023 1031   MCHC 32.4 02/17/2022 1004   RDW 15.7 (H) 10/24/2023 1031   Iron Studies No results found for: IRON, TIBC, FERRITIN, IRONPCTSAT Lipid Panel     Component Value Date/Time   CHOL 171 10/24/2023 1031   TRIG 84 10/24/2023 1031   HDL 67 10/24/2023 1031   CHOLHDL 2 02/17/2022 1004   VLDL 10.4 02/17/2022 1004   LDLCALC 88 10/24/2023 1031   LDLCALC 61 12/21/2017 1509   Hepatic Function Panel     Component Value Date/Time   PROT 6.8 10/24/2023 1031   ALBUMIN 4.1 10/24/2023 1031   AST 21 10/24/2023 1031   ALT 16 10/24/2023 1031   ALKPHOS 80 10/24/2023 1031   BILITOT 0.3 10/24/2023 1031      Component Value  Date/Time   TSH 2.310 10/24/2023 1031   Nutritional Lab Results  Component Value Date   VD25OH 13.1 (L) 10/24/2023   VD25OH 23.8 (L) 08/28/2022   VD25OH 6.7 (L) 04/10/2022    Medications: Outpatient Encounter Medications as of 05/27/2024  Medication Sig   telmisartan  (MICARDIS ) 20 MG tablet Take 1 tablet (20 mg total) by mouth daily.   topiramate  (TOPAMAX ) 25 MG tablet Take 2 tablets (50 mg total) by mouth every evening.   triamcinolone  cream (KENALOG ) 0.1 % Apply 1 Application topically 2 (two) times daily.   amLODipine  (NORVASC ) 5 MG tablet Take 1 tablet (5 mg total) by mouth daily. (Patient not taking: Reported on 05/27/2024)   metFORMIN  (GLUCOPHAGE -XR) 500 MG 24 hr tablet Take 1 tablet (500 mg total) by mouth daily with breakfast. (Patient not taking: Reported on 05/27/2024)   No facility-administered encounter medications on file as of 05/27/2024.     Follow-Up   No follow-ups on file.SABRA She was informed of the importance of frequent follow up visits to maximize her success with intensive lifestyle modifications for her multiple health conditions.  Attestation Statement   Reviewed by clinician on  day of visit: allergies, medications, problem list, medical history, surgical history, family history, social history, and previous encounter notes.     Lucas Parker, MD

## 2024-05-28 ENCOUNTER — Encounter (INDEPENDENT_AMBULATORY_CARE_PROVIDER_SITE_OTHER): Payer: Self-pay | Admitting: Internal Medicine

## 2024-05-28 NOTE — Assessment & Plan Note (Signed)
 Current management including telmisartan . Blood pressure remains elevated, necessitating adjustment of antihypertensive therapy. Discussed the addition of a low-dose diuretic to enhance the efficacy of telmisartan . Emphasized the importance of blood pressure control to prevent cardiovascular complications. Discussed the potential for weight loss to further aid in blood pressure reduction. - Increase telmisartan  to 40 mg with hydrochlorothiazide  12.5 mg daily - Continue to monitor blood pressure regularly. - Avoid caffeinated and energy drinks to prevent further elevation of blood pressure. -Amlodipine  discontinued due to intolerance

## 2024-05-28 NOTE — Assessment & Plan Note (Signed)
 Class 3 obesity with associated insulin  resistance. Current management includes metformin  and topiramate . Topiramate  is being tapered off due to lack of significant appetite suppression. Discussed GLP-1 agonist (liraglutide ) as a new option for weight management, which also aids in diabetes prevention and weight loss. Explained mechanism of action, including appetite suppression and slowed gastric emptying. Discussed potential side effects such as nausea and constipation, and the importance of dietary modifications to avoid gastrointestinal upset. Emphasized the need for adequate fiber and protein intake to prevent muscle loss and support weight loss. Discussed financial considerations and the potential for long-term use of GLP-1 agonist. - Phentermine  discontinued due to hypertension - Taper off topiramate : reduce to one tablet daily for 7 days, then one tablet every other day for 7 days. - Initiate liraglutide : start with 0.6 mg daily for 7 days, then increase to 1.2 mg daily for 7 days, and maintain at 1.8 mg daily thereafter. - Discontinue metformin  upon starting liraglutide . - Ensure adequate fiber and protein intake to prevent muscle loss and support weight loss. - Avoid fatty, sugary foods, and alcohol to prevent gastrointestinal upset. - Purchase pen needles separately from pharmacy.

## 2024-06-03 ENCOUNTER — Other Ambulatory Visit (INDEPENDENT_AMBULATORY_CARE_PROVIDER_SITE_OTHER): Payer: Self-pay

## 2024-06-03 ENCOUNTER — Other Ambulatory Visit (HOSPITAL_COMMUNITY): Payer: Self-pay

## 2024-06-03 DIAGNOSIS — I10 Essential (primary) hypertension: Secondary | ICD-10-CM

## 2024-06-03 DIAGNOSIS — Z6841 Body Mass Index (BMI) 40.0 and over, adult: Secondary | ICD-10-CM

## 2024-06-03 MED ORDER — LIRAGLUTIDE -WEIGHT MANAGEMENT 18 MG/3ML ~~LOC~~ SOPN: PEN_INJECTOR | SUBCUTANEOUS | 0 refills | Status: DC

## 2024-06-10 ENCOUNTER — Other Ambulatory Visit: Payer: Self-pay | Admitting: Bariatrics

## 2024-06-10 DIAGNOSIS — I10 Essential (primary) hypertension: Secondary | ICD-10-CM

## 2024-06-10 DIAGNOSIS — E66813 Obesity, class 3: Secondary | ICD-10-CM

## 2024-06-10 MED ORDER — LIRAGLUTIDE -WEIGHT MANAGEMENT 18 MG/3ML ~~LOC~~ SOPN: PEN_INJECTOR | SUBCUTANEOUS | 0 refills | Status: DC

## 2024-06-16 ENCOUNTER — Other Ambulatory Visit (INDEPENDENT_AMBULATORY_CARE_PROVIDER_SITE_OTHER): Payer: Self-pay | Admitting: Internal Medicine

## 2024-06-16 DIAGNOSIS — I10 Essential (primary) hypertension: Secondary | ICD-10-CM

## 2024-06-26 ENCOUNTER — Ambulatory Visit (INDEPENDENT_AMBULATORY_CARE_PROVIDER_SITE_OTHER): Admitting: Internal Medicine

## 2024-06-30 ENCOUNTER — Ambulatory Visit (INDEPENDENT_AMBULATORY_CARE_PROVIDER_SITE_OTHER): Admitting: Internal Medicine

## 2024-06-30 ENCOUNTER — Encounter (INDEPENDENT_AMBULATORY_CARE_PROVIDER_SITE_OTHER): Payer: Self-pay | Admitting: Internal Medicine

## 2024-06-30 VITALS — BP 148/86 | HR 71 | Temp 98.2°F | Ht 67.0 in | Wt 362.0 lb

## 2024-06-30 DIAGNOSIS — I10 Essential (primary) hypertension: Secondary | ICD-10-CM

## 2024-06-30 DIAGNOSIS — Z6841 Body Mass Index (BMI) 40.0 and over, adult: Secondary | ICD-10-CM

## 2024-06-30 DIAGNOSIS — E66813 Obesity, class 3: Secondary | ICD-10-CM | POA: Diagnosis not present

## 2024-06-30 MED ORDER — LIRAGLUTIDE 18 MG/3ML ~~LOC~~ SOPN: 1.8000 mg | PEN_INJECTOR | Freq: Every day | SUBCUTANEOUS | 0 refills | Status: AC

## 2024-06-30 NOTE — Assessment & Plan Note (Signed)
 Currently on liraglutide  for weight management. Reports headaches, nausea, fatigue, and constipation, possibly related to medication. Appetite reduction and early satiety noted. No significant weight loss yet, but body composition shows decreased fat mass. Stimulant sensitivity noted, affecting blood pressure. - Continue liraglutide  at 1.8 mg for one month. - Monitor for nausea and adjust dose to 1.2 mg if nausea is intolerable. - Use GoodRx for cost savings on liraglutide . - Maintain balanced diet with adequate fiber, water, and protein to prevent constipation and muscle loss. - Avoid greasy, sugary, spicy foods, and alcohol to minimize gastrointestinal discomfort. - Consider peppermint tea, peppermint oil, or ginger oil for nausea relief. - Schedule follow-up appointment in one month to assess weight loss and medication response.

## 2024-06-30 NOTE — Assessment & Plan Note (Signed)
 Patient is avoiding energy drinks and she still drinking 1 caffeinated drink a day her blood pressure remains elevated.  She denies problems with compliance with antihypertensives.  I highly encourage patient to monitor her blood pressure over the next few days and to schedule an appointment with her PCP for medication adjustments.

## 2024-06-30 NOTE — Progress Notes (Signed)
 Office: [phone]  /  Fax: [phone]  Weight Summary and Body Composition Analysis (BIA)  Vitals Temp: 98.2 F (36.8 C) BP: (!) 150/100 Pulse Rate: 71 SpO2: 100 %   Anthropometric Measurements Height: 5' 7 (1.702 m) Weight: (!) 362 lb (164.2 kg) BMI (Calculated): 56.68 Weight at Last Visit: 362 lb Weight Lost Since Last Visit: 0 lb Weight Gained Since Last Visit: 0 lb Starting Weight: 368 lb Total Weight Loss (lbs): 6 lb (2.722 kg) Peak Weight: 391 lb   Body Composition  Body Fat %: 56.1 % Fat Mass (lbs): 203.4 lbs Muscle Mass (lbs): 151.2 lbs Total Body Water (lbs): 116.6 lbs Visceral Fat Rating : 22    RMR: 2261  Today's Visit #: 11  Starting Date: 10/24/23   Subjective   Chief Complaint: Obesity  Interval History Discussed the use of AI scribe software for clinical note transcription with the patient, who gave verbal consent to proceed.  History of Present Illness Lindsey Figueroa is a 38 year old female who presents for medical weight management.  Since last office visit she has maintained although bioimpedance information suggest reductions in fat mass.  She is following in 1400-calorie target 40% of the time she is exercising 2 days a week 45 minutes doing cardio and strengthening.  She has been on generic liraglutide  for two weeks, starting at a dose of 1.8 mg. She experiences headaches, nausea, fatigue, and constipation since beginning the medication. She reports a reduction in appetite and early satiety after only a few bites of food. Initially, there was no decrease in food cravings, but this has improved over time.  She has a history of briefly using compounded GLP-1 medications in the past but discontinued due to cost. No changes in how her clothes fit or any weight loss have been noticed yet, which she attributes to the early stage of treatment.  She is currently taking blood pressure medication and has stopped consuming energy drinks but  continues to drink coffee. She has a history of being sensitive to stimulants, which has previously affected her blood pressure.  A sleep study conducted in May revealed she does not have sleep apnea, with a result of 1.4 events per hour.     Challenges affecting patient progress: work schedule, strong hunger signals and/or impaired satiety / inhibitory control, and frequent travel.    Pharmacotherapy for weight management: She is currently taking liraglutide  1.8 mg once a day.   Assessment and Plan   Treatment Plan For Obesity:  Recommended Dietary Goals  Lindsey Figueroa is currently in the action stage of change. As such, her goal is to continue weight management plan. She has agreed to: continue current plan  Behavioral Health and Counseling  We discussed the following behavioral modification strategies today: continue to work on maintaining a reduced calorie state, getting the recommended amount of protein, incorporating whole foods, making healthy choices, staying well hydrated and practicing mindfulness when eating. and increase protein intake, fibrous foods (25 grams per day for women, 30 grams for men) and water to improve satiety and decrease hunger signals. .  Additional education and resources provided today: None  Recommended Physical Activity Goals  [name] has been advised to work up to 150 minutes of moderate intensity aerobic activity a week and strengthening exercises 2-3 times per week for cardiovascular health, weight loss maintenance and preservation of muscle mass.  She has agreed to :  Increase volume of physical activity to a goal of 240 minutes a week  and Combine aerobic and strengthening exercises for efficiency and improved cardiometabolic health.  Medical Interventions and Pharmacotherapy  We discussed various medication options to help [facility] with her weight loss efforts and we both agreed to : Continue liraglutide  1.8 mg daily for another 4 weeks she has not been  on the 1.8 mg dose long enough if no weight loss after 4 weeks medication will be discontinued due to lack of therapeutic response.  It seems though that medication is having an impact on satiety and satiation.  Associated Conditions Impacted by Obesity Treatment  Assessment & Plan Class 3 severe obesity with serious comorbidity and body mass index (BMI) of 50.0 to 59.9 in adult, unspecified obesity type (HCC) Currently on liraglutide  for weight management. Reports headaches, nausea, fatigue, and constipation, possibly related to medication. Appetite reduction and early satiety noted. No significant weight loss yet, but body composition shows decreased fat mass. Stimulant sensitivity noted, affecting blood pressure. - Continue liraglutide  at 1.8 mg for one month. - Monitor for nausea and adjust dose to 1.2 mg if nausea is intolerable. - Use GoodRx for cost savings on liraglutide . - Maintain balanced diet with adequate fiber, water, and protein to prevent constipation and muscle loss. - Avoid greasy, sugary, spicy foods, and alcohol to minimize gastrointestinal discomfort. - Consider peppermint tea, peppermint oil, or ginger oil for nausea relief. - Schedule follow-up appointment in one month to assess weight loss and medication response. Hypertension, essential Patient is avoiding energy drinks and she still drinking 1 caffeinated drink a day her blood pressure remains elevated.  She denies problems with compliance with antihypertensives.  I highly encourage patient to monitor her blood pressure over the next few days and to schedule an appointment with her PCP for medication adjustments.         Objective   Physical Exam:  Blood pressure (!) 148/86, pulse 71, temperature 98.2 F (36.8 C), height 5' 7 (1.702 m), weight (!) 362 lb (164.2 kg), last menstrual period 06/12/2024, SpO2 100%. Body mass index is 56.7 kg/m.  General: She is overweight, cooperative, alert, well developed, and  in no acute distress. PSYCH: Has normal mood, affect and thought process.   HEENT: EOMI, sclerae are anicteric. Lungs: Normal breathing effort, no conversational dyspnea. Extremities: No edema.  Neurologic: No gross sensory or motor deficits. No tremors or fasciculations noted.    Diagnostic Data Reviewed:  BMET    Component Value Date/Time   NA 139 10/24/2023 1031   K 4.3 10/24/2023 1031   CL 103 10/24/2023 1031   CO2 20 10/24/2023 1031   GLUCOSE 92 10/24/2023 1031   GLUCOSE 98 03/30/2023 1425   BUN 8 10/24/2023 1031   CREATININE 0.65 10/24/2023 1031   CREATININE 0.76 03/30/2023 1425   CALCIUM 9.0 10/24/2023 1031   GFRNONAA >60 03/17/2020 1424   GFRAA >60 03/17/2020 1424   Lab Results  Component Value Date   HGBA1C 5.9 (H) 10/24/2023   HGBA1C 6.0 07/24/2011   Lab Results  Component Value Date   INSULIN  14.1 10/24/2023   INSULIN  14.9 10/12/2020   Lab Results  Component Value Date   TSH 2.310 10/24/2023   CBC    Component Value Date/Time   WBC 5.9 10/24/2023 1031   WBC 8.2 02/17/2022 1004   RBC 5.18 10/24/2023 1031   RBC 5.13 (H) 02/17/2022 1004   HGB 12.6 10/24/2023 1031   HCT 38.9 10/24/2023 1031   PLT 275 10/24/2023 1031   MCV 75 (L) 10/24/2023 1031  MCH 24.3 (L) 10/24/2023 1031   MCH 23.8 (L) 03/17/2020 1424   MCHC 32.4 10/24/2023 1031   MCHC 32.4 02/17/2022 1004   RDW 15.7 (H) 10/24/2023 1031   Iron Studies No results found for: IRON, TIBC, FERRITIN, IRONPCTSAT Lipid Panel     Component Value Date/Time   CHOL 171 10/24/2023 1031   TRIG 84 10/24/2023 1031   HDL 67 10/24/2023 1031   CHOLHDL 2 02/17/2022 1004   VLDL 10.4 02/17/2022 1004   LDLCALC 88 10/24/2023 1031   LDLCALC 61 12/21/2017 1509   Hepatic Function Panel     Component Value Date/Time   PROT 6.8 10/24/2023 1031   ALBUMIN 4.1 10/24/2023 1031   AST 21 10/24/2023 1031   ALT 16 10/24/2023 1031   ALKPHOS 80 10/24/2023 1031   BILITOT 0.3 10/24/2023 1031      Component  Value Date/Time   TSH 2.310 10/24/2023 1031   Nutritional Lab Results  Component Value Date   VD25OH 13.1 (L) 10/24/2023   VD25OH 23.8 (L) 08/28/2022   VD25OH 6.7 (L) 04/10/2022    Medications: Outpatient Encounter Medications as of 06/30/2024  Medication Sig   Insulin  Pen Needle (PEN NEEDLES) 32G X 4 MM MISC 1 Units by Does not apply route every morning.   Liraglutide  -Weight Management 18 MG/3ML SOPN Inject 0.6 mg into the skin daily for 7 days, THEN 1.2 mg daily for 7 days, THEN 1.8 mg daily for 23 days.   telmisartan -hydrochlorothiazide  (MICARDIS  HCT) 40-12.5 MG tablet Take 1 tablet by mouth daily.   triamcinolone  cream (KENALOG ) 0.1 % Apply 1 Application topically 2 (two) times daily.   [DISCONTINUED] metFORMIN  (GLUCOPHAGE -XR) 500 MG 24 hr tablet Take 1 tablet (500 mg total) by mouth daily with breakfast.   [DISCONTINUED] topiramate  (TOPAMAX ) 25 MG tablet Take 2 tablets (50 mg total) by mouth every evening.   No facility-administered encounter medications on file as of 06/30/2024.     Follow-Up   No follow-ups on file.SABRA She was informed of the importance of frequent follow up visits to maximize her success with intensive lifestyle modifications for her multiple health conditions.  Attestation Statement   Reviewed by clinician on day of visit: allergies, medications, problem list, medical history, surgical history, family history, social history, and previous encounter notes.     Lucas Parker, MD

## 2024-07-20 ENCOUNTER — Other Ambulatory Visit (INDEPENDENT_AMBULATORY_CARE_PROVIDER_SITE_OTHER): Payer: Self-pay | Admitting: Internal Medicine

## 2024-07-20 DIAGNOSIS — E66813 Obesity, class 3: Secondary | ICD-10-CM

## 2024-07-20 DIAGNOSIS — I10 Essential (primary) hypertension: Secondary | ICD-10-CM

## 2024-07-22 ENCOUNTER — Encounter (INDEPENDENT_AMBULATORY_CARE_PROVIDER_SITE_OTHER): Payer: Self-pay | Admitting: Internal Medicine

## 2024-07-23 ENCOUNTER — Telehealth (INDEPENDENT_AMBULATORY_CARE_PROVIDER_SITE_OTHER): Payer: Self-pay | Admitting: *Deleted

## 2024-07-23 NOTE — Telephone Encounter (Signed)
 Lindsey Figueroa (Key: [id number])  Your information has been sent to [facility].

## 2024-07-25 ENCOUNTER — Encounter: Payer: Self-pay | Admitting: Family Medicine

## 2024-07-25 ENCOUNTER — Ambulatory Visit: Admitting: Family Medicine

## 2024-07-25 ENCOUNTER — Ambulatory Visit: Attending: Family Medicine

## 2024-07-25 VITALS — BP 142/90 | HR 69 | Temp 97.7°F | Resp 16 | Ht 67.0 in | Wt 374.4 lb

## 2024-07-25 DIAGNOSIS — R002 Palpitations: Secondary | ICD-10-CM

## 2024-07-25 DIAGNOSIS — F418 Other specified anxiety disorders: Secondary | ICD-10-CM

## 2024-07-25 DIAGNOSIS — Z0000 Encounter for general adult medical examination without abnormal findings: Secondary | ICD-10-CM

## 2024-07-25 DIAGNOSIS — Z23 Encounter for immunization: Secondary | ICD-10-CM

## 2024-07-25 DIAGNOSIS — R7303 Prediabetes: Secondary | ICD-10-CM

## 2024-07-25 DIAGNOSIS — I10 Essential (primary) hypertension: Secondary | ICD-10-CM

## 2024-07-25 DIAGNOSIS — L309 Dermatitis, unspecified: Secondary | ICD-10-CM | POA: Diagnosis not present

## 2024-07-25 DIAGNOSIS — E66813 Obesity, class 3: Secondary | ICD-10-CM | POA: Diagnosis not present

## 2024-07-25 DIAGNOSIS — Z6841 Body Mass Index (BMI) 40.0 and over, adult: Secondary | ICD-10-CM

## 2024-07-25 DIAGNOSIS — E88819 Insulin resistance, unspecified: Secondary | ICD-10-CM

## 2024-07-25 DIAGNOSIS — E559 Vitamin D deficiency, unspecified: Secondary | ICD-10-CM | POA: Diagnosis not present

## 2024-07-25 MED ORDER — NONFORMULARY OR COMPOUNDED ITEM: 0 refills | Status: DC

## 2024-07-25 MED ORDER — NONFORMULARY OR COMPOUNDED ITEM: 0 refills | Status: AC

## 2024-07-25 MED ORDER — TRIAMCINOLONE ACETONIDE 0.1 % EX CREA: 1.0000 | TOPICAL_CREAM | Freq: Two times a day (BID) | CUTANEOUS | 3 refills | Status: AC

## 2024-07-25 MED ORDER — ESCITALOPRAM OXALATE 10 MG PO TABS: 10.0000 mg | ORAL_TABLET | Freq: Every day | ORAL | 2 refills | Status: AC

## 2024-07-25 NOTE — Assessment & Plan Note (Signed)
 EKG nsr--- zio patch ordered

## 2024-07-25 NOTE — Progress Notes (Signed)
 "  Subjective:    Patient ID: Lindsey Figueroa, female    DOB: [birth year], 39 y.o.   MRN: [id number]  Chief Complaint  Patient presents with   Annual Exam    Patient presents today for a physical exam.   Quality Metric Gaps    Pap smear, mammogram, Hep C screening, Hep B vaccines, TDAP vaccine    HPI Patient is in today for cpe.  Discussed the use of AI scribe software for clinical note transcription with the patient, who gave verbal consent to proceed.  History of Present Illness Lindsey Figueroa is a 39 year old female who presents with palpitations and itching.  She has been experiencing palpitations for the last few weeks, occurring a few times daily. Initially, she could attribute them to specific causes, but they now occur spontaneously and feel 'heavy,' as if something is 'constantly choking' her. The palpitations can become rapid, making her feel like she is about to stop breathing, especially at night. She has a history of hypertension and was previously prescribed telmisartan , which she stopped taking. She has a full bottle of the medication but has not been taking it regularly.  She reports persistent itching, particularly in her ears, which she noted after seeing a TikTok video suggesting it could be a symptom of perimenopause. She experiences constant ear drainage and itching. Additionally, she has a rash described as 'scoliosis of the skin,' which is itchy and flared up significantly this morning. She applies Aquaphor daily to the rash.  She consumes one eight-ounce cup of coffee daily. She denies any changes in family history and is unaware of any family history of thyroid  problems. No joint issues, and her stomach is okay. She has regular menstrual periods and wants to have a child.   Past Medical History:  Diagnosis Date   Anxiety    Back pain    Bilateral swelling of feet and ankles    Chewing difficulty    Depression    Edema    GERD (gastroesophageal reflux  disease)    Hyperlipemia    Hypertension    Joint pain    Lactose intolerance    Obesity    Other fatigue    Palpitations    Prediabetes    Sciatica    Shortness of breath on exertion    SOB (shortness of breath)    STD (sexually transmitted disease)    Chlamydia   Swallowing difficulty    Vitamin D  deficiency     Past Surgical History:  Procedure Laterality Date   TONSILLECTOMY AND ADENOIDECTOMY     WISDOM TOOTH EXTRACTION  2018    Family History  Problem Relation Age of Onset   Diabetes Mother    Depression Mother    Sleep apnea Mother    High blood pressure Mother    Hypertension Father    High Cholesterol Father    Sleep apnea Maternal Grandmother    Hypertension Maternal Grandmother    Cancer - Lung Maternal Grandfather        Non smoker   Sleep apnea Maternal Uncle     Social History   Socioeconomic History   Marital status: Single    Spouse name: Not on file   Number of children: Not on file   Years of education: Not on file   Highest education level: Not on file  Occupational History   Occupation: spa Event Organiser: OTHER    Comment: [employer]  Tobacco  Use   Smoking status: Never   Smokeless tobacco: Never  Vaping Use   Vaping status: Never Used  Substance and Sexual Activity   Alcohol use: No   Drug use: No   Sexual activity: Yes    Partners: Male    Birth control/protection: Condom    Comment: 1st intercourse 39 yo-More than 5 partners  Other Topics Concern   Not on file  Social History Narrative   Right handed   Lives with her mother, sister, and 81 yr old niece   Caffeine: 3 cups/day ----- cutting down    Social Drivers of Health   Tobacco Use: Low Risk (07/25/2024)   Patient History    Smoking Tobacco Use: Never    Smokeless Tobacco Use: Never    Passive Exposure: Not on file  Financial Resource Strain: Not on file  Food Insecurity: Not on file  Transportation Needs: Not on file  Physical Activity: Not on file   Stress: Not on file  Social Connections: Not on file  Intimate Partner Violence: Not on file  Depression (PHQ2-9): High Risk (07/25/2024)   Depression (PHQ2-9)    PHQ-2 Score: 17  Alcohol Screen: Not on file  Housing: Not on file  Utilities: Not on file  Health Literacy: Not on file    Outpatient Medications Prior to Visit  Medication Sig Dispense Refill   Insulin  Pen Needle (PEN NEEDLES) 32G X 4 MM MISC 1 Units by Does not apply route every morning. 30 each 2   liraglutide  (VICTOZA ) 18 MG/3ML SOPN Inject 1.8 mg into the skin daily. 9 mL 0   telmisartan -hydrochlorothiazide  (MICARDIS  HCT) 40-12.5 MG tablet Take 1 tablet by mouth daily. 30 tablet 0   triamcinolone  cream (KENALOG ) 0.1 % Apply 1 Application topically 2 (two) times daily. 30 g 0   No facility-administered medications prior to visit.    Allergies  Allergen Reactions   Amlodipine  Nausea Only and Other (See Comments)    Headache    Review of Systems  Constitutional:  Negative for chills, fever and malaise/fatigue.  HENT:  Negative for congestion and hearing loss.   Eyes:  Negative for discharge.  Respiratory:  Negative for cough, sputum production and shortness of breath.   Cardiovascular:  Negative for chest pain, palpitations and leg swelling.  Gastrointestinal:  Negative for abdominal pain, blood in stool, constipation, diarrhea, heartburn, nausea and vomiting.  Genitourinary:  Negative for dysuria, frequency, hematuria and urgency.  Musculoskeletal:  Negative for back pain, falls and myalgias.  Skin:  Negative for rash.  Neurological:  Negative for dizziness, sensory change, loss of consciousness, weakness and headaches.  Endo/Heme/Allergies:  Negative for environmental allergies. Does not bruise/bleed easily.  Psychiatric/Behavioral:  Negative for depression and suicidal ideas. The patient is not nervous/anxious and does not have insomnia.        Objective:    Physical Exam Vitals and nursing note  reviewed.  Constitutional:      General: She is not in acute distress.    Appearance: Normal appearance. She is well-developed.  HENT:     Head: Normocephalic and atraumatic.     Right Ear: Tympanic membrane, ear canal and external ear normal. There is no impacted cerumen.     Left Ear: Tympanic membrane, ear canal and external ear normal. There is no impacted cerumen.     Nose: Nose normal.     Mouth/Throat:     Mouth: Mucous membranes are moist.     Pharynx: Oropharynx is clear. No oropharyngeal  exudate or posterior oropharyngeal erythema.  Eyes:     General: No scleral icterus.       Right eye: No discharge.        Left eye: No discharge.     Conjunctiva/sclera: Conjunctivae normal.     Pupils: Pupils are equal, round, and reactive to light.  Neck:     Thyroid : No thyromegaly or thyroid  tenderness.     Vascular: No JVD.  Cardiovascular:     Rate and Rhythm: Normal rate and regular rhythm.     Heart sounds: Normal heart sounds. No murmur heard. Pulmonary:     Effort: Pulmonary effort is normal. No respiratory distress.     Breath sounds: Normal breath sounds.  Abdominal:     General: Bowel sounds are normal. There is no distension.     Palpations: Abdomen is soft. There is no mass.     Tenderness: There is no abdominal tenderness. There is no guarding or rebound.  Genitourinary:    Vagina: Normal.  Musculoskeletal:        General: Normal range of motion.     Cervical back: Normal range of motion and neck supple.     Right lower leg: No edema.     Left lower leg: No edema.  Lymphadenopathy:     Cervical: No cervical adenopathy.  Skin:    General: Skin is warm and dry.     Findings: No erythema or rash.  Neurological:     Mental Status: She is alert and oriented to person, place, and time.     Cranial Nerves: No cranial nerve deficit.     Deep Tendon Reflexes: Reflexes are normal and symmetric.  Psychiatric:        Mood and Affect: Mood normal.        Behavior:  Behavior normal.        Thought Content: Thought content normal.        Judgment: Judgment normal.     BP (!) 142/90   Pulse 69   Temp 97.7 F (36.5 C)   Resp 16   Ht 5' 7 (1.702 m)   Wt (!) 374 lb 6.4 oz (169.8 kg)   LMP 07/08/2024 (Approximate)   SpO2 99%   BMI 58.64 kg/m  Wt Readings from Last 3 Encounters:  07/25/24 (!) 374 lb 6.4 oz (169.8 kg)  06/30/24 (!) 362 lb (164.2 kg)  05/27/24 (!) 362 lb (164.2 kg)    Diabetic Foot Exam - Simple   No data filed    Lab Results  Component Value Date   WBC 5.9 10/24/2023   HGB 12.6 10/24/2023   HCT 38.9 10/24/2023   PLT 275 10/24/2023   GLUCOSE 92 10/24/2023   CHOL 171 10/24/2023   TRIG 84 10/24/2023   HDL 67 10/24/2023   LDLCALC 88 10/24/2023   ALT 16 10/24/2023   AST 21 10/24/2023   NA 139 10/24/2023   K 4.3 10/24/2023   CL 103 10/24/2023   CREATININE 0.65 10/24/2023   BUN 8 10/24/2023   CO2 20 10/24/2023   TSH 2.310 10/24/2023   HGBA1C 5.9 (H) 10/24/2023    Lab Results  Component Value Date   TSH 2.310 10/24/2023   Lab Results  Component Value Date   WBC 5.9 10/24/2023   HGB 12.6 10/24/2023   HCT 38.9 10/24/2023   MCV 75 (L) 10/24/2023   PLT 275 10/24/2023   Lab Results  Component Value Date   NA 139 10/24/2023   K  4.3 10/24/2023   CO2 20 10/24/2023   GLUCOSE 92 10/24/2023   BUN 8 10/24/2023   CREATININE 0.65 10/24/2023   BILITOT 0.3 10/24/2023   ALKPHOS 80 10/24/2023   AST 21 10/24/2023   ALT 16 10/24/2023   PROT 6.8 10/24/2023   ALBUMIN 4.1 10/24/2023   CALCIUM 9.0 10/24/2023   ANIONGAP 9 03/17/2020   EGFR 114 10/24/2023   GFR 109.76 02/17/2022   Lab Results  Component Value Date   CHOL 171 10/24/2023   Lab Results  Component Value Date   HDL 67 10/24/2023   Lab Results  Component Value Date   LDLCALC 88 10/24/2023   Lab Results  Component Value Date   TRIG 84 10/24/2023   Lab Results  Component Value Date   CHOLHDL 2 02/17/2022   Lab Results  Component Value Date    HGBA1C 5.9 (H) 10/24/2023       Assessment & Plan:  Preventative health care Assessment & Plan: Ghm utd Check labs  See AVS Health Maintenance  Topic Date Due   Hepatitis C Screening  Never done   Hepatitis B Vaccines 19-59 Average Risk (1 of 3 - 19+ 3-dose series) Never done   HPV VACCINES (1 - Risk 3-dose SCDM series) Never done   Cervical Cancer Screening (HPV/Pap Cotest)  12/22/2022   Mammogram  Never done   COVID-19 Vaccine (3 - 2025-26 season) 08/10/2024 (Originally 03/17/2024)   Influenza Vaccine  10/14/2024 (Originally 02/15/2024)   DTaP/Tdap/Td (3 - Td or Tdap) 07/25/2034   HIV Screening  Completed   Pneumococcal Vaccine  Aged Out   Meningococcal B Vaccine  Aged Out      Need for Tdap vaccination -     Tdap vaccine greater than or equal to 7yo IM  Depression with anxiety -     Escitalopram  Oxalate; Take 1 tablet (10 mg total) by mouth daily.  Dispense: 30 tablet; Refill: 2  Class 3 severe obesity with serious comorbidity and body mass index (BMI) of 50.0 to 59.9 in adult, unspecified obesity type (HCC) -     CBC with Differential/Platelet -     Comprehensive metabolic panel with GFR -     NONFORMULARY OR COMPOUNDED ITEM; Wegovy  1.5 mg #30 1 po every day  Dispense: 30 each; Refill: 0  Hypertension, essential -     CBC with Differential/Platelet -     Comprehensive metabolic panel with GFR -     Lipid panel -     TSH  Insulin  resistance -     Hemoglobin A1c -     Insulin , random  Vitamin D  deficiency  Dermatitis -     Triamcinolone  Acetonide; Apply 1 Application topically 2 (two) times daily.  Dispense: 30 g; Refill: 3  Palpitations -     LONG TERM MONITOR (3-14 DAYS); Future -     EKG 12-Lead  Prediabetes Assessment & Plan: Check A1c    Palpitation Assessment & Plan: EKG nsr--- zio patch ordered   Class 3 severe obesity without serious comorbidity with body mass index (BMI) of 50.0 to 59.9 in adult, unspecified obesity type [facility]) Assessment &  Plan: Per [facility]   Assessment and Plan Assessment & Plan Depression with anxiety   She is experiencing stress, anxiety, and depression. Lexapro  is prescribed once daily at night to manage symptoms and aid sleep.  Essential hypertension   She was previously prescribed telmisartan  but stopped taking it due to perceived ineffectiveness. Her blood pressure remains elevated, increasing the  risk of cardiovascular events. The importance of medication adherence to prevent heart attack or stroke was discussed. She is encouraged to resume telmisartan  for blood pressure management and will follow up with Dr. Francyne on January 19th for potential medication adjustment.  Class 3 severe obesity   Her obesity contributes to hypertension. Weight loss could potentially reduce the need for antihypertensive medication. She should continue weight management efforts with Dr. Francyne.  Dermatitis   She has a recent flare-up of an itchy, scaly rash. No prior treatment was applied other than daily Aquaphor. A topical cream is prescribed for rash management.  Palpitations   She experiences daily palpitations with episodes of rapid heart rate and sensation of choking, particularly at night. An EKG is performed to evaluate cardiac function.  General Health Maintenance   Routine health maintenance was discussed, including blood work and tetanus vaccination. Blood work is ordered, and a tetanus vaccination is administered.    Clayden Withem R Lowne Chase, DO  "

## 2024-07-25 NOTE — Patient Instructions (Signed)
 Palpitations Palpitations are feelings that your heartbeat is irregular or is faster than normal. It may feel like your heart is fluttering or skipping a beat. Palpitations may be caused by many things, including smoking, caffeine, alcohol, stress, and certain medicines or drugs. Most causes of palpitations are not serious.  However, some palpitations can be a sign of a serious problem. Further tests and a thorough medical history will be done to find the cause of your palpitations. Your provider may order tests such as an ECG, labs, an echocardiogram, or an ambulatory continuous ECG monitor. Follow these instructions at home: Pay attention to any changes in your symptoms. Let your health care provider know about them. Take these actions to help manage your symptoms: Eating and drinking Follow instructions from your health care provider about eating or drinking restrictions. You may need to avoid foods and drinks that may cause palpitations. These may include: Caffeinated coffee, tea, soft drinks, and energy drinks. Chocolate. Alcohol. Diet pills. Lifestyle     Take steps to reduce your stress and anxiety. Things that can help you relax include: Yoga. Mind-body activities, such as deep breathing, meditation, or using words and images to create positive thoughts (guided imagery). Physical activity, such as swimming, jogging, or walking. Tell your health care provider if your palpitations increase with activity. If you have chest pain or shortness of breath with activity, do not continue the activity until you are seen by your health care provider. Biofeedback. This is a method that helps you learn to use your mind to control things in your body, such as your heartbeat. Get plenty of rest and sleep. Keep a regular bed time. Do not use drugs, including cocaine or ecstasy. Do not use marijuana. Do not use any products that contain nicotine or tobacco. These products include cigarettes, chewing  tobacco, and vaping devices, such as e-cigarettes. If you need help quitting, ask your health care provider. General instructions Take over-the-counter and prescription medicines only as told by your health care provider. Keep all follow-up visits. This is important. These may include visits for further testing if palpitations do not go away or get worse. Contact a health care provider if: You continue to have a fast or irregular heartbeat for a long period of time. You notice that your palpitations occur more often. Get help right away if: You have chest pain or shortness of breath. You have a severe headache. You feel dizzy or you faint. These symptoms may represent a serious problem that is an emergency. Do not wait to see if the symptoms will go away. Get medical help right away. Call your local emergency services (911 in the [place] Do not drive yourself to the hospital. Summary Palpitations are feelings that your heartbeat is irregular or is faster than normal. It may feel like your heart is fluttering or skipping a beat. Palpitations may be caused by many things, including smoking, caffeine, alcohol, stress, certain medicines, and drugs. Further tests and a thorough medical history may be done to find the cause of your palpitations. Get help right away if you faint or have chest pain, shortness of breath, severe headache, or dizziness. This information is not intended to replace advice given to you by your health care provider. Make sure you discuss any questions you have with your health care provider. Document Revised: 11/24/2020 Document Reviewed: 11/24/2020 Elsevier Patient Education  2024 [employer]Preventive Care 35-26 Years Old, Female Preventive care refers to lifestyle choices and visits with your health care  provider that can promote health and wellness. Preventive care visits are also called wellness exams. What can I expect for my preventive care visit? Counseling Your  health care provider may ask you questions about your: Medical history, including: Past medical problems. Family medical history. Pregnancy history. Current health, including: Menstrual cycle. Method of birth control. Emotional well-being. Home life and relationship well-being. Sexual activity and sexual health. Lifestyle, including: Alcohol, nicotine or tobacco, and drug use. Access to firearms. Diet, exercise, and sleep habits. Work and work astronomer. Sunscreen use. Safety issues such as seatbelt and bike helmet use. Physical exam Your health care provider will check your: Height and weight. These may be used to calculate your BMI (body mass index). BMI is a measurement that tells if you are at a healthy weight. Waist circumference. This measures the distance around your waistline. This measurement also tells if you are at a healthy weight and may help predict your risk of certain diseases, such as type 2 diabetes and high blood pressure. Heart rate and blood pressure. Body temperature. Skin for abnormal spots. What immunizations do I need?  Vaccines are usually given at various ages, according to a schedule. Your health care provider will recommend vaccines for you based on your age, medical history, and lifestyle or other factors, such as travel or where you work. What tests do I need? Screening Your health care provider may recommend screening tests for certain conditions. This may include: Lipid and cholesterol levels. Diabetes screening. This is done by checking your blood sugar (glucose) after you have not eaten for a while (fasting). Pelvic exam and Pap test. Hepatitis B test. Hepatitis C test. HIV (human immunodeficiency virus) test. STI (sexually transmitted infection) testing, if you are at risk. Lung cancer screening. Colorectal cancer screening. Mammogram. Talk with your health care provider about when you should start having regular mammograms. This may  depend on whether you have a family history of breast cancer. BRCA-related cancer screening. This may be done if you have a family history of breast, ovarian, tubal, or peritoneal cancers. Bone density scan. This is done to screen for osteoporosis. Talk with your health care provider about your test results, treatment options, and if necessary, the need for more tests. Follow these instructions at home: Eating and drinking  Eat a diet that includes fresh fruits and vegetables, whole grains, lean protein, and low-fat dairy products. Take vitamin and mineral supplements as recommended by your health care provider. Do not drink alcohol if: Your health care provider tells you not to drink. You are pregnant, may be pregnant, or are planning to become pregnant. If you drink alcohol: Limit how much you have to 0-1 drink a day. Know how much alcohol is in your drink. In the [place] one drink equals one 12 oz bottle of beer (355 mL), one 5 oz glass of wine (148 mL), or one 1 oz glass of hard liquor (44 mL). Lifestyle Brush your teeth every morning and night with fluoride toothpaste. Floss one time each day. Exercise for at least 30 minutes 5 or more days each week. Do not use any products that contain nicotine or tobacco. These products include cigarettes, chewing tobacco, and vaping devices, such as e-cigarettes. If you need help quitting, ask your health care provider. Do not use drugs. If you are sexually active, practice safe sex. Use a condom or other form of protection to prevent STIs. If you do not wish to become pregnant, use a form of birth control. If  you plan to become pregnant, see your health care provider for a prepregnancy visit. Take aspirin only as told by your health care provider. Make sure that you understand how much to take and what form to take. Work with your health care provider to find out whether it is safe and beneficial for you to take aspirin daily. Find healthy ways to  manage stress, such as: Meditation, yoga, or listening to music. Journaling. Talking to a trusted person. Spending time with friends and family. Minimize exposure to UV radiation to reduce your risk of skin cancer. Safety Always wear your seat belt while driving or riding in a vehicle. Do not drive: If you have been drinking alcohol. Do not ride with someone who has been drinking. When you are tired or distracted. While texting. If you have been using any mind-altering substances or drugs. Wear a helmet and other protective equipment during sports activities. If you have firearms in your house, make sure you follow all gun safety procedures. Seek help if you have been physically or sexually abused. What's next? Visit your health care provider once a year for an annual wellness visit. Ask your health care provider how often you should have your eyes and teeth checked. Stay up to date on all vaccines. This information is not intended to replace advice given to you by your health care provider. Make sure you discuss any questions you have with your health care provider. Document Revised: 12/29/2020 Document Reviewed: 12/29/2020 Elsevier Patient Education  2024 [employer].

## 2024-07-25 NOTE — Assessment & Plan Note (Signed)
 Check A1c.

## 2024-07-25 NOTE — Assessment & Plan Note (Signed)
 Ghm utd Check labs  See AVS Health Maintenance  Topic Date Due   Hepatitis C Screening  Never done   Hepatitis B Vaccines 19-59 Average Risk (1 of 3 - 19+ 3-dose series) Never done   HPV VACCINES (1 - Risk 3-dose SCDM series) Never done   Cervical Cancer Screening (HPV/Pap Cotest)  12/22/2022   Mammogram  Never done   COVID-19 Vaccine (3 - 2025-26 season) 08/10/2024 (Originally 03/17/2024)   Influenza Vaccine  10/14/2024 (Originally 02/15/2024)   DTaP/Tdap/Td (3 - Td or Tdap) 07/25/2034   HIV Screening  Completed   Pneumococcal Vaccine  Aged Out   Meningococcal B Vaccine  Aged Out

## 2024-07-25 NOTE — Progress Notes (Unsigned)
 EP to read.

## 2024-07-25 NOTE — Assessment & Plan Note (Signed)
Per [facility]

## 2024-07-28 LAB — CBC WITH DIFFERENTIAL/PLATELET
Absolute Lymphocytes: 3580 {cells}/uL (ref 850–3900)
Absolute Monocytes: 418 {cells}/uL (ref 200–950)
Basophils Absolute: 38 {cells}/uL (ref 0–200)
Basophils Relative: 0.5 %
Eosinophils Absolute: 122 {cells}/uL (ref 15–500)
Eosinophils Relative: 1.6 %
HCT: 37.3 % (ref 35.9–46.0)
Hemoglobin: 11.7 g/dL (ref 11.7–15.5)
MCH: 23.8 pg — ABNORMAL LOW (ref 27.0–33.0)
MCHC: 31.4 g/dL — ABNORMAL LOW (ref 31.6–35.4)
MCV: 75.8 fL — ABNORMAL LOW (ref 81.4–101.7)
MPV: 12.5 fL (ref 7.5–12.5)
Monocytes Relative: 5.5 %
Neutro Abs: 3443 {cells}/uL (ref 1500–7800)
Neutrophils Relative %: 45.3 %
Platelets: 202 Thousand/uL (ref 140–400)
RBC: 4.92 Million/uL (ref 3.80–5.10)
RDW: 15.6 % — ABNORMAL HIGH (ref 11.0–15.0)
Total Lymphocyte: 47.1 %
WBC: 7.6 Thousand/uL (ref 3.8–10.8)

## 2024-07-28 LAB — HEMOGLOBIN A1C
Hgb A1c MFr Bld: 5.8 % — ABNORMAL HIGH
Mean Plasma Glucose: 120 mg/dL
eAG (mmol/L): 6.6 mmol/L

## 2024-07-28 LAB — COMPREHENSIVE METABOLIC PANEL WITH GFR
AG Ratio: 1.6 (calc) (ref 1.0–2.5)
ALT: 17 U/L (ref 6–29)
AST: 16 U/L (ref 10–30)
Albumin: 4.1 g/dL (ref 3.6–5.1)
Alkaline phosphatase (APISO): 69 U/L (ref 31–125)
BUN: 12 mg/dL (ref 7–25)
CO2: 25 mmol/L (ref 20–32)
Calcium: 8.9 mg/dL (ref 8.6–10.2)
Chloride: 106 mmol/L (ref 98–110)
Creat: 0.69 mg/dL (ref 0.50–0.99)
Globulin: 2.5 g/dL (ref 1.9–3.7)
Glucose, Bld: 98 mg/dL (ref 65–99)
Potassium: 4 mmol/L (ref 3.5–5.3)
Sodium: 140 mmol/L (ref 135–146)
Total Bilirubin: 0.3 mg/dL (ref 0.2–1.2)
Total Protein: 6.6 g/dL (ref 6.1–8.1)
eGFR: 112 mL/min/1.73m2

## 2024-07-28 LAB — TSH: TSH: 2.32 m[IU]/L

## 2024-07-28 LAB — LIPID PANEL
Cholesterol: 164 mg/dL
HDL: 71 mg/dL
LDL Cholesterol (Calc): 77 mg/dL
Non-HDL Cholesterol (Calc): 93 mg/dL
Total CHOL/HDL Ratio: 2.3 (calc)
Triglycerides: 76 mg/dL

## 2024-07-28 LAB — INSULIN, RANDOM: Insulin: 12.4 u[IU]/mL

## 2024-07-29 ENCOUNTER — Encounter: Payer: Self-pay | Admitting: Family Medicine

## 2024-07-29 DIAGNOSIS — Z6841 Body Mass Index (BMI) 40.0 and over, adult: Secondary | ICD-10-CM

## 2024-07-30 MED ORDER — WEGOVY 1.5 MG PO TABS: 1.5000 mg | ORAL_TABLET | Freq: Every day | ORAL | 0 refills | Status: AC

## 2024-08-03 ENCOUNTER — Ambulatory Visit: Payer: Self-pay | Admitting: Family Medicine

## 2024-08-04 ENCOUNTER — Ambulatory Visit (INDEPENDENT_AMBULATORY_CARE_PROVIDER_SITE_OTHER): Admitting: Internal Medicine

## 2024-08-07 ENCOUNTER — Ambulatory Visit (HOSPITAL_BASED_OUTPATIENT_CLINIC_OR_DEPARTMENT_OTHER)
Admission: RE | Admit: 2024-08-07 | Discharge: 2024-08-07 | Disposition: A | Discharge status: Home / Self Care | Source: Ambulatory Visit | Attending: Family Medicine | Admitting: Family Medicine

## 2024-08-07 ENCOUNTER — Encounter (HOSPITAL_BASED_OUTPATIENT_CLINIC_OR_DEPARTMENT_OTHER): Payer: Self-pay

## 2024-08-07 DIAGNOSIS — Z1231 Encounter for screening mammogram for malignant neoplasm of breast: Secondary | ICD-10-CM | POA: Insufficient documentation

## 2024-08-21 NOTE — Telephone Encounter (Signed)
 Archived by Olam Mccallum 22 days ago

## 2024-08-28 ENCOUNTER — Ambulatory Visit (INDEPENDENT_AMBULATORY_CARE_PROVIDER_SITE_OTHER): Admitting: Internal Medicine

## 2024-09-30 ENCOUNTER — Encounter (HOSPITAL_BASED_OUTPATIENT_CLINIC_OR_DEPARTMENT_OTHER): Payer: Self-pay | Admitting: Obstetrics and Gynecology
